# Patient Record
Sex: Female | Born: 1935 | Race: Black or African American | Hispanic: No
Health system: Southern US, Community
[De-identification: ages and names within clinical notes are randomized; demographics above are authoritative.]

## PROBLEM LIST (undated history)

## (undated) DIAGNOSIS — N182 Chronic kidney disease, stage 2 (mild): Secondary | ICD-10-CM

## (undated) DIAGNOSIS — F0390 Unspecified dementia without behavioral disturbance: Secondary | ICD-10-CM

## (undated) DIAGNOSIS — F32A Depression, unspecified: Secondary | ICD-10-CM

## (undated) DIAGNOSIS — I10 Essential (primary) hypertension: Secondary | ICD-10-CM

## (undated) DIAGNOSIS — Z789 Other specified health status: Secondary | ICD-10-CM

## (undated) DIAGNOSIS — F329 Major depressive disorder, single episode, unspecified: Secondary | ICD-10-CM

## (undated) DIAGNOSIS — E039 Hypothyroidism, unspecified: Secondary | ICD-10-CM

## (undated) DIAGNOSIS — N289 Disorder of kidney and ureter, unspecified: Secondary | ICD-10-CM

## (undated) HISTORY — PX: OTHER SURGICAL HISTORY: SHX169

## (undated) HISTORY — PX: FOOT SURGERY: SHX648

## (undated) HISTORY — DX: Major depressive disorder, single episode, unspecified: F32.9

## (undated) HISTORY — DX: Chronic kidney disease, stage 2 (mild): N18.2

## (undated) HISTORY — PX: CARDIAC SURGERY: SHX584

## (undated) HISTORY — DX: Depression, unspecified: F32.A

## (undated) HISTORY — DX: Hypothyroidism, unspecified: E03.9

## (undated) HISTORY — DX: Unspecified dementia, unspecified severity, without behavioral disturbance, psychotic disturbance, mood disturbance, and anxiety: F03.90

---

## 2013-11-21 ENCOUNTER — Emergency Department (HOSPITAL_BASED_OUTPATIENT_CLINIC_OR_DEPARTMENT_OTHER): Payer: PRIVATE HEALTH INSURANCE

## 2013-11-21 ENCOUNTER — Inpatient Hospital Stay (HOSPITAL_BASED_OUTPATIENT_CLINIC_OR_DEPARTMENT_OTHER)
Admission: EM | Admit: 2013-11-21 | Discharge: 2013-11-29 | DRG: 592 | Disposition: A | Discharge status: Skilled Nursing Facility | Payer: PRIVATE HEALTH INSURANCE | Attending: Internal Medicine | Admitting: Internal Medicine

## 2013-11-21 ENCOUNTER — Encounter (HOSPITAL_BASED_OUTPATIENT_CLINIC_OR_DEPARTMENT_OTHER): Payer: Self-pay | Admitting: Emergency Medicine

## 2013-11-21 DIAGNOSIS — L8993 Pressure ulcer of unspecified site, stage 3: Secondary | ICD-10-CM | POA: Diagnosis present

## 2013-11-21 DIAGNOSIS — N183 Chronic kidney disease, stage 3 unspecified: Secondary | ICD-10-CM

## 2013-11-21 DIAGNOSIS — E872 Acidosis, unspecified: Secondary | ICD-10-CM | POA: Diagnosis present

## 2013-11-21 DIAGNOSIS — T148XXA Other injury of unspecified body region, initial encounter: Secondary | ICD-10-CM

## 2013-11-21 DIAGNOSIS — E875 Hyperkalemia: Secondary | ICD-10-CM | POA: Diagnosis present

## 2013-11-21 DIAGNOSIS — Z515 Encounter for palliative care: Secondary | ICD-10-CM

## 2013-11-21 DIAGNOSIS — M7150 Other bursitis, not elsewhere classified, unspecified site: Secondary | ICD-10-CM | POA: Diagnosis present

## 2013-11-21 DIAGNOSIS — L089 Local infection of the skin and subcutaneous tissue, unspecified: Secondary | ICD-10-CM

## 2013-11-21 DIAGNOSIS — R32 Unspecified urinary incontinence: Secondary | ICD-10-CM | POA: Diagnosis present

## 2013-11-21 DIAGNOSIS — L8995 Pressure ulcer of unspecified site, unstageable: Secondary | ICD-10-CM | POA: Diagnosis present

## 2013-11-21 DIAGNOSIS — L89109 Pressure ulcer of unspecified part of back, unspecified stage: Principal | ICD-10-CM | POA: Diagnosis present

## 2013-11-21 DIAGNOSIS — Z66 Do not resuscitate: Secondary | ICD-10-CM | POA: Diagnosis not present

## 2013-11-21 DIAGNOSIS — F418 Other specified anxiety disorders: Secondary | ICD-10-CM

## 2013-11-21 DIAGNOSIS — M25552 Pain in left hip: Secondary | ICD-10-CM

## 2013-11-21 DIAGNOSIS — F329 Major depressive disorder, single episode, unspecified: Secondary | ICD-10-CM | POA: Diagnosis present

## 2013-11-21 DIAGNOSIS — I129 Hypertensive chronic kidney disease with stage 1 through stage 4 chronic kidney disease, or unspecified chronic kidney disease: Secondary | ICD-10-CM | POA: Diagnosis present

## 2013-11-21 DIAGNOSIS — N189 Chronic kidney disease, unspecified: Secondary | ICD-10-CM | POA: Diagnosis present

## 2013-11-21 DIAGNOSIS — L89209 Pressure ulcer of unspecified hip, unspecified stage: Secondary | ICD-10-CM | POA: Diagnosis present

## 2013-11-21 DIAGNOSIS — I10 Essential (primary) hypertension: Secondary | ICD-10-CM

## 2013-11-21 DIAGNOSIS — N289 Disorder of kidney and ureter, unspecified: Secondary | ICD-10-CM

## 2013-11-21 DIAGNOSIS — R262 Difficulty in walking, not elsewhere classified: Secondary | ICD-10-CM

## 2013-11-21 DIAGNOSIS — M719 Bursopathy, unspecified: Secondary | ICD-10-CM

## 2013-11-21 DIAGNOSIS — L89153 Pressure ulcer of sacral region, stage 3: Secondary | ICD-10-CM

## 2013-11-21 DIAGNOSIS — L8990 Pressure ulcer of unspecified site, unspecified stage: Secondary | ICD-10-CM

## 2013-11-21 DIAGNOSIS — F3289 Other specified depressive episodes: Secondary | ICD-10-CM | POA: Diagnosis present

## 2013-11-21 DIAGNOSIS — L89309 Pressure ulcer of unspecified buttock, unspecified stage: Secondary | ICD-10-CM | POA: Diagnosis present

## 2013-11-21 DIAGNOSIS — E860 Dehydration: Secondary | ICD-10-CM

## 2013-11-21 DIAGNOSIS — R627 Adult failure to thrive: Secondary | ICD-10-CM | POA: Diagnosis present

## 2013-11-21 HISTORY — DX: Disorder of kidney and ureter, unspecified: N28.9

## 2013-11-21 HISTORY — DX: Essential (primary) hypertension: I10

## 2013-11-21 HISTORY — DX: Other specified health status: Z78.9

## 2013-11-21 LAB — CBC WITH DIFFERENTIAL/PLATELET
BASOS ABS: 0 10*3/uL (ref 0.0–0.1)
Basophils Relative: 0 % (ref 0–1)
EOS PCT: 0 % (ref 0–5)
Eosinophils Absolute: 0 10*3/uL (ref 0.0–0.7)
HCT: 38.6 % (ref 36.0–46.0)
Hemoglobin: 12.9 g/dL (ref 12.0–15.0)
Lymphocytes Relative: 11 % — ABNORMAL LOW (ref 12–46)
Lymphs Abs: 1.9 10*3/uL (ref 0.7–4.0)
MCH: 31.4 pg (ref 26.0–34.0)
MCHC: 33.4 g/dL (ref 30.0–36.0)
MCV: 93.9 fL (ref 78.0–100.0)
Monocytes Absolute: 1 10*3/uL (ref 0.1–1.0)
Monocytes Relative: 6 % (ref 3–12)
NEUTROS PCT: 83 % — AB (ref 43–77)
Neutro Abs: 14 10*3/uL — ABNORMAL HIGH (ref 1.7–7.7)
PLATELETS: 421 10*3/uL — AB (ref 150–400)
RBC: 4.11 MIL/uL (ref 3.87–5.11)
RDW: 13.4 % (ref 11.5–15.5)
WBC: 16.9 10*3/uL — ABNORMAL HIGH (ref 4.0–10.5)

## 2013-11-21 LAB — COMPREHENSIVE METABOLIC PANEL
ALK PHOS: 89 U/L (ref 39–117)
ALT: 7 U/L (ref 0–35)
AST: 22 U/L (ref 0–37)
Albumin: 2.3 g/dL — ABNORMAL LOW (ref 3.5–5.2)
BUN: 44 mg/dL — ABNORMAL HIGH (ref 6–23)
CO2: 21 meq/L (ref 19–32)
Calcium: 10.3 mg/dL (ref 8.4–10.5)
Chloride: 101 mEq/L (ref 96–112)
Creatinine, Ser: 1.5 mg/dL — ABNORMAL HIGH (ref 0.50–1.10)
GFR, EST AFRICAN AMERICAN: 34 mL/min — AB (ref >90)
GFR, EST NON AFRICAN AMERICAN: 29 mL/min — AB (ref >90)
GLUCOSE: 127 mg/dL — AB (ref 70–99)
POTASSIUM: 5.8 meq/L — AB (ref 3.7–5.3)
Sodium: 139 mEq/L (ref 137–147)
TOTAL PROTEIN: 7.6 g/dL (ref 6.0–8.3)
Total Bilirubin: 0.4 mg/dL (ref 0.3–1.2)

## 2013-11-21 LAB — CK: Total CK: 60 U/L (ref 7–177)

## 2013-11-21 LAB — TROPONIN I: Troponin I: <0.3 ng/mL (ref <0.30)

## 2013-11-21 MED ORDER — MORPHINE SULFATE 4 MG/ML IJ SOLN
4.0000 mg | Freq: Once | INTRAMUSCULAR | Status: AC
  Administered 2013-11-21: 4 mg via INTRAMUSCULAR
  Filled 2013-11-21: qty 1

## 2013-11-21 MED ORDER — SODIUM CHLORIDE 0.9 % IV BOLUS (SEPSIS): 1000.0000 mL | Freq: Once | INTRAVENOUS | Status: DC

## 2013-11-21 NOTE — ED Provider Notes (Signed)
CSN: [id number]     Arrival date & time 11/21/13  1906 History  This chart was scribed for Geoffery Lyonsouglas Musab Wingard, MD by Leone PayorSonum Patel, ED Scribe. This patient was seen in room MH11/MH11 and the patient's care was started 7:33 PM.    Chief Complaint  Patient presents with  . Skin Ulcer    The history is provided by the patient. The history is limited by the condition of the patient. No language interpreter was used.    HPI Comments: Lindsay Browning is a 80 y.o. female brought in by ambulance, who presents to the Emergency Department complaining of skin ulcers to the buttocks. Pt states she has been lying on her couch for the past 7 weeks. She also complains of left hip pain. She reports living alone.   Past Medical History  Diagnosis Date  . Medical history unknown    Past Surgical History  Procedure Laterality Date  . Surgical history unknown     No family history on file. History  Substance Use Topics  . Smoking status: Unknown If Ever Smoked  . Smokeless tobacco: Not on file  . Alcohol Use: No   OB History   Grav Para Term Preterm Abortions TAB SAB Ect Mult Living                 Review of Systems  Unable to perform ROS  A complete 10 system review of systems was obtained and all systems are negative except as noted in the HPI and PMH.   Allergies  Review of patient's allergies indicates not on file.  Home Medications   Current Outpatient Rx  Name  Route  Sig  Dispense  Refill  . UNKNOWN TO PATIENT                BP 119/78  Pulse 98  Temp(Src) 97.8 F (36.6 C) (Oral)  Resp 20  SpO2 100% Physical Exam  Nursing note and vitals reviewed. Constitutional: She is oriented to person, place, and time.  Pt is an elderly female who appears unkempt. She is awake and alert and somewhat agitated.    HENT:  Head: Normocephalic and atraumatic.  Neck: Normal range of motion. Neck supple.  Cardiovascular: Normal rate.   Pulmonary/Chest: Effort normal.  Abdominal: She exhibits  no distension.  Neurological: She is alert and oriented to person, place, and time.  Skin: Skin is warm and dry.  Large sacral decubitus ulcer present that extends into the subcutaneous tissues. No muscle or bone visible.   Psychiatric: She has a normal mood and affect.    ED Course  Procedures (including critical care time)  DIAGNOSTIC STUDIES: Oxygen Saturation is 100% on RA, normal by my interpretation.    COORDINATION OF CARE: 7:51 PM Discussed treatment plan with pt at bedside and pt agreed to plan.   Labs Review Labs Reviewed - No data to display Imaging Review No results found.  EKG Interpretation    Date/Time:  Thursday November 21 2013 20:11:33 EST Ventricular Rate:  101 PR Interval:  172 QRS Duration: 88 QT Interval:  364 QTC Calculation: 471 R Axis:   -51 Text Interpretation:  Sinus tachycardia Left axis deviation Possible Anterior infarct , age undetermined Abnormal ECG Confirmed by DELOS  MD, Meria Crilly ([id number]) on 11/21/2013 9:21:08 PM            MDM  No diagnosis found. Patient is a 80 year old female with history of hypertension and chronic renal insufficiency. She was recently admitted at  [facility] for right hip pain. She had an MRI performed at that time which revealed a torn gluteus muscle. From reviewing the discharge summary it appears as though she was quite difficult with the hospital staff and she was ultimately discharged back to home. Her neighbor went to check on her today and found her on the sofa with it appearing as if she had been there for quite some time. She was apparently wallowing in her own feces and there were  bedbugs on the sofa and the patient. She was brought here extremely unkempt and disheveled. She was very uncooperative from the time she arrived here.  On exam she has a large sacral decubitus ulcer. She also appears dehydrated. I had planned to hydrate the patient, however multiple attempts at IV access by both nursing and  myself with and without the aid of ultrasound, were unsuccessful. I had attempted also to place an EJ catheter, however the patient informed me that she has had a neck injury in the past and that her head will not turn. An attempted arterial stick was made in the radial artery to obtain blood for the lab, however this was unsuccessful as well. I ultimately obtained blood for laboratory studies through a femoral puncture. Laboratory studies reveal a white count of 17,000 and BUN and creatinine are 44 and 1.5. IV access has been unable to be obtained and I do not feel as though sedating this uncooperative patient at [facility] for placement of a central line is in this patient's best interest.  She is clearly not in a situation where she can safely return home. I cannot say for sure that she has the decision-making capacity to refuse care, and she clearly does not have the physical ability to return home. She will require consult from social services, IV hydration, and treatment of this large decubitus ulcer which she has developed. As these are all things that I cannot do at this facility, I believe she requires admission at the main hospital. I have spoken with Dr. Onalee Hua at [facility] regarding this possibility. She agrees to accept the patient for observation.   I personally performed the services described in this documentation, which was scribed in my presence. The recorded information has been reviewed and is accurate.      Geoffery Lyons, MD 11/22/13 0001

## 2013-11-21 NOTE — ED Notes (Signed)
No old EKG found  

## 2013-11-21 NOTE — ED Notes (Signed)
Pt. Daughter Timothy LassoCharise Horne called RN Earlene Plateravis at [facility].  Pt. Daughter reports she is from [place] and cant be here to care for the Pt.    Pt. Daughter reports to RN Earlene PlaterDavis that the Pt. Eats what people bring to her.  Pt. Daughter reports that the Pt. Speaks with her daily.   Pt. Daughter reports the Pt. Will not let anyone help her with anything.

## 2013-11-21 NOTE — ED Notes (Signed)
Pt. Fights when RN and others attempt IV and blood draws.

## 2013-11-21 NOTE — ED Notes (Signed)
Attempted Urine via in and out cath on  Pt. With no success.  Pt. Intolerant of cath and will not sit on bedpan due to sores on buttocks and sore legs.

## 2013-11-21 NOTE — ED Notes (Addendum)
tcf patient's daughter, Lanier Prudesharice, she requested that patient be transferred to [facility] for further care if possible updated on poc and progress as of 2130

## 2013-11-21 NOTE — ED Notes (Signed)
EMS called by daughter.  States pt has been unable to care for herself and found lying on the sofa soiled in her feces and urine.  Per EMS sofa was infested with bedbugs.  Pt also has a decubitus on buttocks.

## 2013-11-21 NOTE — ED Notes (Signed)
Pt.  Will not do what we ask due to she reports pain and does not want anyone touching her.

## 2013-11-21 NOTE — ED Notes (Signed)
Vitals given to [employer]

## 2013-11-22 DIAGNOSIS — F329 Major depressive disorder, single episode, unspecified: Secondary | ICD-10-CM

## 2013-11-22 DIAGNOSIS — N183 Chronic kidney disease, stage 3 unspecified: Secondary | ICD-10-CM

## 2013-11-22 DIAGNOSIS — R262 Difficulty in walking, not elsewhere classified: Secondary | ICD-10-CM

## 2013-11-22 DIAGNOSIS — N289 Disorder of kidney and ureter, unspecified: Secondary | ICD-10-CM | POA: Diagnosis present

## 2013-11-22 DIAGNOSIS — L8993 Pressure ulcer of unspecified site, stage 3: Secondary | ICD-10-CM

## 2013-11-22 DIAGNOSIS — L89109 Pressure ulcer of unspecified part of back, unspecified stage: Principal | ICD-10-CM

## 2013-11-22 DIAGNOSIS — M25559 Pain in unspecified hip: Secondary | ICD-10-CM

## 2013-11-22 DIAGNOSIS — L8990 Pressure ulcer of unspecified site, unspecified stage: Secondary | ICD-10-CM | POA: Insufficient documentation

## 2013-11-22 DIAGNOSIS — L089 Local infection of the skin and subcutaneous tissue, unspecified: Secondary | ICD-10-CM

## 2013-11-22 DIAGNOSIS — E860 Dehydration: Secondary | ICD-10-CM

## 2013-11-22 DIAGNOSIS — T148XXA Other injury of unspecified body region, initial encounter: Secondary | ICD-10-CM

## 2013-11-22 DIAGNOSIS — F3289 Other specified depressive episodes: Secondary | ICD-10-CM

## 2013-11-22 DIAGNOSIS — I10 Essential (primary) hypertension: Secondary | ICD-10-CM | POA: Diagnosis present

## 2013-11-22 LAB — BASIC METABOLIC PANEL
BUN: 46 mg/dL — AB (ref 6–23)
CHLORIDE: 103 meq/L (ref 96–112)
CO2: 17 mEq/L — ABNORMAL LOW (ref 19–32)
Calcium: 9.9 mg/dL (ref 8.4–10.5)
Creatinine, Ser: 1.68 mg/dL — ABNORMAL HIGH (ref 0.50–1.10)
GFR calc Af Amer: 29 mL/min — ABNORMAL LOW (ref >90)
GFR calc non Af Amer: 25 mL/min — ABNORMAL LOW (ref >90)
Glucose, Bld: 114 mg/dL — ABNORMAL HIGH (ref 70–99)
Potassium: 5.4 mEq/L — ABNORMAL HIGH (ref 3.7–5.3)
Sodium: 138 mEq/L (ref 137–147)

## 2013-11-22 MED ORDER — ENSURE COMPLETE PO LIQD
237.0000 mL | Freq: Two times a day (BID) | ORAL | Status: DC
  Administered 2013-11-22 – 2013-11-25 (×3): 237 mL via ORAL

## 2013-11-22 MED ORDER — SODIUM CHLORIDE 0.9 % IV SOLN
INTRAVENOUS | Status: AC
  Administered 2013-11-22: 03:00:00 via INTRAVENOUS

## 2013-11-22 MED ORDER — ZINC SULFATE 220 (50 ZN) MG PO CAPS
220.0000 mg | ORAL_CAPSULE | Freq: Every day | ORAL | Status: DC
  Administered 2013-11-23 – 2013-11-28 (×4): 220 mg via ORAL
  Filled 2013-11-22 (×8): qty 1

## 2013-11-22 MED ORDER — VITAMIN C 500 MG PO TABS
500.0000 mg | ORAL_TABLET | Freq: Two times a day (BID) | ORAL | Status: DC
  Administered 2013-11-23 – 2013-11-28 (×5): 500 mg via ORAL
  Filled 2013-11-22 (×16): qty 1

## 2013-11-22 MED ORDER — MORPHINE SULFATE 2 MG/ML IJ SOLN
2.0000 mg | INTRAMUSCULAR | Status: DC | PRN
  Administered 2013-11-22: 2 mg via INTRAVENOUS
  Filled 2013-11-22: qty 1

## 2013-11-22 MED ORDER — VANCOMYCIN HCL 10 G IV SOLR
1250.0000 mg | INTRAVENOUS | Status: AC
  Administered 2013-11-22: 1250 mg via INTRAVENOUS
  Filled 2013-11-22: qty 1250

## 2013-11-22 MED ORDER — DEXTROSE 5 % IV SOLN
1.0000 g | INTRAVENOUS | Status: DC
  Administered 2013-11-22 – 2013-11-24 (×3): 1 g via INTRAVENOUS
  Filled 2013-11-22 (×4): qty 1

## 2013-11-22 MED ORDER — NON FORMULARY: 500.0000 mL | Freq: Two times a day (BID) | Status: DC

## 2013-11-22 MED ORDER — VANCOMYCIN HCL IN DEXTROSE 750-5 MG/150ML-% IV SOLN
750.0000 mg | INTRAVENOUS | Status: DC
  Administered 2013-11-23 – 2013-11-24 (×2): 750 mg via INTRAVENOUS
  Filled 2013-11-22 (×4): qty 150

## 2013-11-22 MED ORDER — DAKINS (1/4 STRENGTH) 0.125 % EX SOLN
Freq: Two times a day (BID) | CUTANEOUS | Status: DC
  Administered 2013-11-26 – 2013-11-28 (×4)
  Filled 2013-11-22 (×2): qty 473

## 2013-11-22 MED ORDER — ADULT MULTIVITAMIN W/MINERALS CH
1.0000 | ORAL_TABLET | Freq: Every day | ORAL | Status: DC
  Administered 2013-11-23 – 2013-11-28 (×4): 1 via ORAL
  Filled 2013-11-22 (×8): qty 1

## 2013-11-22 MED ORDER — NEBIVOLOL HCL 10 MG PO TABS
10.0000 mg | ORAL_TABLET | Freq: Every day | ORAL | Status: DC
  Administered 2013-11-22 – 2013-11-28 (×5): 10 mg via ORAL
  Filled 2013-11-22 (×9): qty 1

## 2013-11-22 MED ORDER — HEPARIN SODIUM (PORCINE) 5000 UNIT/ML IJ SOLN
5000.0000 [IU] | Freq: Three times a day (TID) | INTRAMUSCULAR | Status: DC
  Filled 2013-11-22 (×24): qty 1

## 2013-11-22 MED ORDER — OXYCODONE-ACETAMINOPHEN 5-325 MG PO TABS
1.0000 | ORAL_TABLET | Freq: Four times a day (QID) | ORAL | Status: DC | PRN
  Administered 2013-11-22 – 2013-11-25 (×6): 1 via ORAL
  Administered 2013-11-25: 2 via ORAL
  Administered 2013-11-27 – 2013-11-29 (×3): 1 via ORAL
  Filled 2013-11-22: qty 2
  Filled 2013-11-22: qty 1
  Filled 2013-11-22: qty 2
  Filled 2013-11-22 (×4): qty 1
  Filled 2013-11-22: qty 2
  Filled 2013-11-22 (×5): qty 1

## 2013-11-22 NOTE — Consult Note (Addendum)
WOC wound consult note Reason for Consult: evaluation of sacral pressure ulcer. Pt combative, unable to rationalize with patient the cause and treatment for pressure ulcers.  She is lying on wads of linen and underpads from her transport to the floor and refuses to allow staff to turn her, to assess her wounds and remove these linens.  Apparently based on the records she has been in her home/unkept, unclear on who has been caring for her if at all. She reports "nerve pain" in her left hip that caused her to not be able to walk and she has been lying somewhere in the home for 7 weeks. She also reports "bedsores" on her hip that she has been caring for. She is actively yelling at 5 staff members including me at the bedside as we are trying to gently encourage her to turn over on her own. She can not be convinced that pressure ulcers can be a source of sepsis and can result in death.  She is very angry with staff.  I proceeded to turn her with the assistance of the linens under her at which time she is screaming and swinging her left arm to hit me.  She is swinging at the other staff during this time as well.  I was able to visualize the wound for a very short period of time (see below) Wound type: Unstageable Pressure ulcer with 100% yellow slough/and some black soft eschar covering her wound Pressure Ulcer POA: Yes Measurement: at least 20cm x 30cm (probally bigger but she started to hit this WOC nurse, therefore I was not able to get accurate measurements) Wound bed: yellow/black/wet, necrotic tissue. No visualized muscle/bone Drainage (amount, consistency, odor) unclear since she is lying in urine and feces, but the buttocks area macerated and there is full thickness skin loss over the entire lower buttocks associated with moisture and incontinence. Dressing procedure/placement/frequency: Discussed case with Dr. Jerral RalphGhimire, ideally she would benefit from PT for hydrotherapy, placement of air mattress overlay  and use of chemical debridement since area is so foul and large at current state.  However I do not think this patient will allow any of theses treatments.  I will start with ordering chemical debridement with Dakin's solution for the wounds on her buttocks/sacrum to see if she will even allow the staff to apply this.  Will add air mattress but again pt may refuse.    Psychiatric evaluation pending for decision making abilities, it would be helpful if the MD will look at her wounds to see the extent to which this patient has allowed herself go. These wounds are very apparently related to incontinence, pressure, sheer and friction.  Very well may be a source of sepsis now or very soon. Will need maximum nutrition and aggressive wound care for wounds to heal if that is the patients and families wishes. Otherwise would consider Palliative consult for GOC and possible hospice care.  Discussed POC with patient and bedside nurse.  Re consult if needed, will not follow at this time. Thanks  Jalee Saine [employer] RN, CWOCN [phone])

## 2013-11-22 NOTE — Progress Notes (Signed)
Pt refused multiple times to let us remove extra blankets underneath her. Attempted one last time at 6:40 am. Still no progress on this issue.

## 2013-11-22 NOTE — Progress Notes (Signed)
PATIENT DETAILS Name: Elwyn Ladesabell C Addair Age: 80 y.o. Sex: female Date of Birth: Mar 10, 1919 Admit Date: 11/21/2013 Admitting Physician Haydee Monicaachal A David, MD UJW:[id number]:TRIPP, Sherilyn CooterHENRY, MD  Subjective: Very uncooperative. Refuses examination.  Assessment/Plan: Principal Problem:   Infected sacral decubitus-Unstageable - Difficult situation, Uncooperative will not let us examine properly to assess how bad the wound is. Per wound RN, who briefly examined, she has a unstageable sacral decubitus with a lot of necrotic area. Will start empiric vancomycin and Zosyn given leukocytosis. - Further workup will depend on patient's cooperation, will need psychiatry to assess this patient's capacity to make medical decisions. She clearly does not seem to understand all the medical complex issues, and only wants to ensure that she is discharged back home.  Bursitis left hip - Consulted Dr. Magnus IvanBlackman orthopedics to evaluate if patient is a candidate for  intra-articular steroid injection. - Apparently from the history obtained, patient was discharged recently from [facility], has been complaining of persistent left hip pain and as a result she has been nonambulatory. She now presents to our hospital with infected decubitus ulcer, apparently her neighbors found her lying covered in urine and feces.  Suspected chronic kidney disease stage III - Unknown baseline. - Have asked RN to get medical records from [facility], however patient refused to sign a release of medical records form. - For now monitor electrolytes periodically, check a UA if patient will give us a sample  Hyperkalemia - Will treat with Kayexalate, recheck electrolytes in a.m.  Hypertension - Continue with Bystolic  Social issues - Apparently discharged from [facility] a few weeks back-lives alone-has 2 daughters-[name] [place]Horne in  [place] ([place] [phone]) and Marlynn [place]kingSharie who [place] in  [place] ([place] [phone]). Patient was found by her neighbors, lying in feces and urine. Apparently non-ambulatory due to severe left hip pain. - Very difficult situation, she does not understand all of noted medical issues, only insisting on being discharged HOME- refusing/uncooperative to exam, questions and further evaluation. - Have asked psychiatry to evaluate to see if she has capacity.  Disposition: Remain inpatient  DVT Prophylaxis: Prophylactic Heparin  Code Status: Full code   Family Communication Spoke with Loleta BooksSharice Horne over the phone, she lives in  [place] ([place] [phone]).   Procedures:  None  CONSULTS:  psychiatry and orthopedic surgery  Time spent 40 minutes-which includes 50% of the time with face-to-face with patient/ family and coordinating care related to the above assessment and plan.    MEDICATIONS: Scheduled Meds: . feeding supplement (ENSURE COMPLETE)  237 mL Oral BID BM  . multivitamin with minerals  1 tablet Oral Daily  . nebivolol  10 mg Oral Daily  . NON FORMULARY 500 mL  500 mL Irrigation BID   Continuous Infusions: . sodium chloride 75 mL/hr at 11/22/13 0246   PRN Meds:.morphine injection, oxyCODONE-acetaminophen  Antibiotics: Anti-infectives   None       PHYSICAL EXAM: Vital signs in last 24 hours: Filed Vitals:   11/22/13 0023 11/22/13 0145 11/22/13 0650 11/22/13 0900  BP: 116/53 107/62 125/50   Pulse: 70 85 65   Temp:  98 F (36.7 C) 97.6 F (36.4 C)   TempSrc:  Oral    Resp:  16 18   Height:    5\' 6"  (1.676 m)  Weight:    87.544 kg (193 lb)  SpO2:  99% 100%     Weight change:  Filed Weights   11/22/13 0900  Weight: 87.544 kg (  193 lb)   Body mass index is 31.17 kg/(m^2).   Gen Exam: Awake and mostly alert with clear speech.  Very uncooperative- therefore limited exam Neck: Supple, No JVD.   Chest: B/L Clear.   CVS: S1 S2 Regular, no murmurs.  Abdomen: soft, BS +, non tender, non distended.  Extremities:  no edema, lower extremities warm to touch. Neurologic: Non Focal.  Seems to move all 4 extremities   Intake/Output from previous day:  Intake/Output Summary (Last 24 hours) at 11/22/13 1158 Last data filed at 11/22/13 0513  Gross per 24 hour  Intake 383.75 ml  Output      0 ml  Net 383.75 ml     LAB RESULTS: CBC  Recent Labs Lab 11/21/13 2153  WBC 16.9*  HGB 12.9  HCT 38.6  PLT 421*  MCV 93.9  MCH 31.4  MCHC 33.4  RDW 13.4  LYMPHSABS 1.9  MONOABS 1.0  EOSABS 0.0  BASOSABS 0.0    Chemistries   Recent Labs Lab 11/21/13 2153 11/22/13 0432  NA 139 138  K 5.8* 5.4*  CL 101 103  CO2 21 17*  GLUCOSE 127* 114*  BUN 44* 46*  CREATININE 1.50* 1.68*  CALCIUM 10.3 9.9    CBG: No results found for this basename: GLUCAP,  in the last 168 hours  GFR Estimated Creatinine Clearance: 23.8 ml/min (by C-G formula based on Cr of 1.68).  Coagulation profile No results found for this basename: INR, PROTIME,  in the last 168 hours  Cardiac Enzymes  Recent Labs Lab 11/21/13 2153  TROPONINI <0.30    No components found with this basename: POCBNP,  No results found for this basename: DDIMER,  in the last 72 hours No results found for this basename: HGBA1C,  in the last 72 hours No results found for this basename: CHOL, HDL, LDLCALC, TRIG, CHOLHDL, LDLDIRECT,  in the last 72 hours No results found for this basename: TSH, T4TOTAL, FREET3, T3FREE, THYROIDAB,  in the last 72 hours No results found for this basename: VITAMINB12, FOLATE, FERRITIN, TIBC, IRON, RETICCTPCT,  in the last 72 hours No results found for this basename: LIPASE, AMYLASE,  in the last 72 hours  Urine Studies No results found for this basename: UACOL, UAPR, USPG, UPH, UTP, UGL, UKET, UBIL, UHGB, UNIT, UROB, ULEU, UEPI, UWBC, URBC, UBAC, CAST, CRYS, UCOM, BILUA,  in the last 72 hours  MICROBIOLOGY: No results found for this or any previous visit (from the past 240 hour(s)).  RADIOLOGY  STUDIES/RESULTS: Dg Chest 2 View  11/22/2013   CLINICAL DATA:  Skin ulcer.  EXAM: CHEST  2 VIEW  COMPARISON:  None currently available  FINDINGS: No cardiomegaly. No acute infiltrate or edema. No effusion or pneumothorax. No acute osseous findings.  IMPRESSION: No active cardiopulmonary disease.   Electronically Signed   By: Tiburcio Pea M.D.   On: 11/22/2013 00:03   Dg Hip Complete Left  11/22/2013   CLINICAL DATA:  Skin ulcer  EXAM: LEFT HIP - COMPLETE 2+ VIEW  COMPARISON:  09/27/2013  FINDINGS: No evidence of acute fracture or dislocation involving the hips. The pelvic ring is intact. There is advanced lower lumbar facet degeneration. Calcified uterine fibroid noted. Punctate high-density material is chronically associated with the left gluteus musculature near the greater trochanter.  IMPRESSION: No acute osseous findings.   Electronically Signed   By: Tiburcio Pea M.D.   On: 11/22/2013 00:40    Jeoffrey Massed, MD  [facility] Pager:[phone]  If 7PM-7AM, please  contact night-coverage [email or web address] Password [email or web address] 11/22/2013, 11:58 AM   LOS: 1 day

## 2013-11-22 NOTE — Progress Notes (Signed)
Spoke with patient's daughter Lanier PrudeSharice via phone. States she is willing to talk to social work about possibilities for care for her mother. Cell number [phone]. Daughter lives in [place].

## 2013-11-22 NOTE — Progress Notes (Addendum)
ANTIBIOTIC CONSULT NOTE - INITIAL  Pharmacy Consult for vancomycin and ceftazidime Indication: wound infection  Allergies  Allergen Reactions  . Penicillins     Patient Measurements: Height: 5\' 6"  (167.6 cm) Weight: 193 lb (87.544 kg) IBW/kg (Calculated) : 59.3  Vital Signs: Temp: 97.6 F (36.4 C) (01/16 0650) Temp src: Oral (01/16 0145) BP: 125/50 mmHg (01/16 0650) Pulse Rate: 65 (01/16 0650) Intake/Output from previous day: 01/15 0701 - 01/16 0700 In: 383.8 [P.O.:200; I.V.:183.8] Out: -  Intake/Output from this shift:    Labs:  Recent Labs  11/21/13 2153 11/22/13 0432  WBC 16.9*  --   HGB 12.9  --   PLT 421*  --   CREATININE 1.50* 1.68*   Estimated Creatinine Clearance: 23.8 ml/min (by C-G formula based on Cr of 1.68). No results found for this basename: VANCOTROUGH, VANCOPEAK, VANCORANDOM, GENTTROUGH, GENTPEAK, GENTRANDOM, TOBRATROUGH, TOBRAPEAK, TOBRARND, AMIKACINPEAK, AMIKACINTROU, AMIKACIN,  in the last 72 hours   Microbiology: No results found for this or any previous visit (from the past 720 hour(s)).  Medical History: Past Medical History  Diagnosis Date  . Medical history unknown   . Hypertension   . Renal disorder     Medications:  Prescriptions prior to admission  Medication Sig Dispense Refill  . acyclovir (ZOVIRAX) 200 MG capsule Take 200 mg by mouth 5 (five) times daily.      . nebivolol (BYSTOLIC) 10 MG tablet Take 10 mg by mouth daily.      . potassium chloride (K-DUR) 10 MEQ tablet Take 10 mEq by mouth daily.      . solifenacin (VESICARE) 10 MG tablet Take by mouth daily.       Assessment: 80 y/o female who developed a sacral decub ulcer from laying on her couch for 3 weeks and was found covered in urine and feces. Pharmacy consulted to begin vancomycin and ceftazidime for empiric coverage. SCr is elevated at 1.68. WBC are elevated but patient is afebrile. Noted patient has a PCN allergy with unknown reaction. Spoke with patient to try  and determine reaction and she tells me she's never taken any PCN.   Goal of Therapy:  Vancomycin trough level 10-15 mcg/ml  Plan:  -Vancomycin 1250 mg IV once then 750 mg IV q24h -Ceftazidime 1 g IV q24h -Monitor renal function and clinical course  [facility] LLCJennifer Richfield, [address] BoulevardPharm.D., BCPS Clinical Pharmacist Pager: [phone] 11/22/2013 11:59 AM

## 2013-11-22 NOTE — Progress Notes (Signed)
Patient ID: Lindsay Browning, female   DOB: 1919-08-16, 80 y.o.   MRN: [id number] I really can not get a good exam on her left hip at all.  She really will not let me move it or touch it to get an idea with what may be going on.  Thus, I can't even provide an injection.  I have no recommendations at all other than supportive care.  Maybe a new MRI could show if there is any new left hip pathology, but the [facility] from back in November was negative for a fracture.  The primary team can attempt a new MRI if warranted.  Please call back if needed.

## 2013-11-22 NOTE — H&P (Signed)
PCP:   Florentina Jenny, MD   Chief Complaint:  Hip pain  HPI: 80 yo female lives alone h/o htn only was found by her neighbor laying on her cough covered in urine and feces.  ems called, took pt to [facility] center.  Pt was hospitalized at [facility] 2-3 weeks ago for left hip pain.  Multiple imaging studies done revealing left gluteal tendon abnormality and bursitis.  Pt refused snf or rehab so was sent home.  No fracture found.  Pt reports she has been laying on her couch for 3 weeks.  Pt very uncooperative with answering questions.  She has a large amt of distrust of medical professionals.  She says her hip has been so painful she can no longer ambulate.  She has been eating bread only per her report.  She has since developed a sacral decub ulcer which is painful.  She has ordered some herbal medicine online which she thinks will fix her hip problem.  Pt is angry that she has been forced to come here.  She thinks we are trying to take away her independence by putting her in an "old folks home".  i was called by dr Judd Lien to have the pt placed in facility where she can get some help.  Review of Systems:  Positive and negative as per HPI otherwise all other systems are negative  Past Medical History: Past Medical History  Diagnosis Date  . Medical history unknown   . Hypertension   . Renal disorder    Past Surgical History  Procedure Laterality Date  . Surgical history unknown    . Cardiac surgery      cath  . Foot surgery      Medications: Prior to Admission medications   Medication Sig Start Date End Date Taking? Authorizing Provider  acyclovir (ZOVIRAX) 200 MG capsule Take 200 mg by mouth 5 (five) times daily.   Yes Historical Provider, MD  nebivolol (BYSTOLIC) 10 MG tablet Take 10 mg by mouth daily.   Yes Historical Provider, MD  potassium chloride (K-DUR) 10 MEQ tablet Take 10 mEq by mouth daily.   Yes Historical Provider, MD  solifenacin (VESICARE) 10 MG tablet Take by mouth  daily.   Yes Historical Provider, MD  UNKNOWN TO PATIENT    Yes Historical Provider, MD    Allergies:   Allergies  Allergen Reactions  . Penicillins     Social History: Lives at home alone  Family History: none  Physical Exam: Filed Vitals:   11/21/13 1917 11/21/13 2323 11/22/13 0023 11/22/13 0145  BP: 119/78 115/52 116/53 107/62  Pulse: 98 92 70 85  Temp: 97.8 F (36.6 C) 97.8 F (36.6 C)  98 F (36.7 C)  TempSrc: Oral   Oral  Resp: 20 19  16   SpO2: 100% 100%  99%   General appearance: alert, cooperative and no distress Head: Normocephalic, without obvious abnormality, atraumatic Eyes: negative Nose: Nares normal. Septum midline. Mucosa normal. No drainage or sinus tenderness. Neck: no JVD and supple, symmetrical, trachea midline Lungs: clear to auscultation bilaterally Heart: regular rate and rhythm, S1, S2 normal, no murmur, click, rub or gallop Abdomen: soft, non-tender; bowel sounds normal; no masses,  no organomegaly Extremities: extremities normal, atraumatic, no cyanosis or edema pain with any movement to left hip Pulses: 2+ and symmetric Skin: Skin color, texture, turgor normal. No rashes or lesions stage 3 sacral decub ulcer, no surrounding erythema or purulent dc Neurologic: Grossly normal Psych:  Uncooperative but  competent   Labs on Admission:   Recent Labs  11/21/13 2153  NA 139  K 5.8*  CL 101  CO2 21  GLUCOSE 127*  BUN 44*  CREATININE 1.50*  CALCIUM 10.3    Recent Labs  11/21/13 2153  AST 22  ALT 7  ALKPHOS 89  BILITOT 0.4  PROT 7.6  ALBUMIN 2.3*     Recent Labs  11/21/13 2153  WBC 16.9*  NEUTROABS 14.0*  HGB 12.9  HCT 38.6  MCV 93.9  PLT 421*    Recent Labs  11/21/13 2153  CKTOTAL 60  TROPONINI <0.30   Radiological Exams on Admission: Dg Chest 2 View  11/22/2013   CLINICAL DATA:  Skin ulcer.  EXAM: CHEST  2 VIEW  COMPARISON:  None currently available  FINDINGS: No cardiomegaly. No acute infiltrate or edema. No  effusion or pneumothorax. No acute osseous findings.  IMPRESSION: No active cardiopulmonary disease.   Electronically Signed   By: Tiburcio PeaJonathan  Watts M.D.   On: 11/22/2013 00:03   Dg Hip Complete Left  11/22/2013   CLINICAL DATA:  Skin ulcer  EXAM: LEFT HIP - COMPLETE 2+ VIEW  COMPARISON:  09/27/2013  FINDINGS: No evidence of acute fracture or dislocation involving the hips. The pelvic ring is intact. There is advanced lower lumbar facet degeneration. Calcified uterine fibroid noted. Punctate high-density material is chronically associated with the left gluteus musculature near the greater trochanter.  IMPRESSION: No acute osseous findings.   Electronically Signed   By: Tiburcio PeaJonathan  Watts M.D.   On: 11/22/2013 00:40    Assessment/Plan  80 yo female with left hip pain, inability to walk with new pressure ulcer  Principal Problem:   Sacral decubitus ulcer, stage III- wound care in am.  PT eval.  Active Problems:   Hypertension   Renal disorder  Pt is vague about how she has been getting food and water, she has to be getting this on her own somehow.  Will need to call ortho in am to evaluate for any further w/u or treatment options ??steroid injection.  Place on percocet orally.  i think she needs rehab/and or snf, but she absolutely refuses at this time.  Readdress in the morning.  [facility] records reviewed.  Saide Lanuza A 11/22/2013, 2:34 AM

## 2013-11-22 NOTE — Progress Notes (Addendum)
Pt remains agitated and aggressive. Pt refusing all 2200 meds and any attempt to provide wound care at this time. Will continue to monitor.

## 2013-11-22 NOTE — Progress Notes (Signed)
UR completed. Patient changed to inpatient r/t requiring IVF @ 75cc/hr and IV antibiotics.

## 2013-11-22 NOTE — Progress Notes (Signed)
Patient refused to allow nurse and NT to change her soiled linens after multiple attempts. Patient also refused to sign medical release forms. MD paged and made aware of situation.

## 2013-11-22 NOTE — Consult Note (Signed)
Reason for Consult:capacity evaluation Referring Physician: Jonetta Osgood, MD   Lindsay Browning is an 80 y.o. female.  HPI: Patient is seen and chart reviewed, case discussed with Dr. Sloan Leiter and her daughter who lives in [place]. Patient has been suffering with extensive medical problem and has limited understanding and does not have capacity to understand in depth of the problems and complication of the recommended procedure etc. She has been reluctant or refusing medical care at this time.   MSE: Patient appeared lying on her bed and partially covered. She knows her name and where she is at and her problem is nerve pain but does not understand the extent of the medical problem or care required. She has been living all by herself and says not ready to give up freedom and she does believe in god etc. She has upset and agree affect but denied being angry mood. She has no evidence of psychosis.   Past Medical History  Diagnosis Date  . Medical history unknown   . Hypertension   . Renal disorder     Past Surgical History  Procedure Laterality Date  . Surgical history unknown    . Cardiac surgery      cath  . Foot surgery      No family history on file.  Social History:  reports that she does not drink alcohol or use illicit drugs. Her tobacco history is not on file.  Allergies:  Allergies  Allergen Reactions  . Penicillins Other (See Comments)    Unknown - pt states she's never taken PCN before    Medications: I have reviewed the patient's current medications.  Results for orders placed during the hospital encounter of 11/21/13 (from the past 48 hour(s))  CBC WITH DIFFERENTIAL     Status: Abnormal   Collection Time    11/21/13  9:53 PM      Result Value Range   WBC 16.9 (*) 4.0 - 10.5 K/uL   RBC 4.11  3.87 - 5.11 MIL/uL   Hemoglobin 12.9  12.0 - 15.0 g/dL   HCT 38.6  36.0 - 46.0 %   MCV 93.9  78.0 - 100.0 fL   MCH 31.4  26.0 - 34.0 pg   MCHC 33.4  30.0 - 36.0 g/dL   RDW 13.4  11.5 - 15.5 %   Platelets 421 (*) 150 - 400 K/uL   Neutrophils Relative % 83 (*) 43 - 77 %   Lymphocytes Relative 11 (*) 12 - 46 %   Monocytes Relative 6  3 - 12 %   Eosinophils Relative 0  0 - 5 %   Basophils Relative 0  0 - 1 %   Neutro Abs 14.0 (*) 1.7 - 7.7 K/uL   Lymphs Abs 1.9  0.7 - 4.0 K/uL   Monocytes Absolute 1.0  0.1 - 1.0 K/uL   Eosinophils Absolute 0.0  0.0 - 0.7 K/uL   Basophils Absolute 0.0  0.0 - 0.1 K/uL  COMPREHENSIVE METABOLIC PANEL     Status: Abnormal   Collection Time    11/21/13  9:53 PM      Result Value Range   Sodium 139  137 - 147 mEq/L   Potassium 5.8 (*) 3.7 - 5.3 mEq/L   Chloride 101  96 - 112 mEq/L   CO2 21  19 - 32 mEq/L   Glucose, Bld 127 (*) 70 - 99 mg/dL   BUN 44 (*) 6 - 23 mg/dL   Creatinine, Ser 1.50 (*)  0.50 - 1.10 mg/dL   Calcium 10.3  8.4 - 10.5 mg/dL   Total Protein 7.6  6.0 - 8.3 g/dL   Albumin 2.3 (*) 3.5 - 5.2 g/dL   AST 22  0 - 37 U/L   Comment: SLIGHT HEMOLYSIS     HEMOLYSIS AT THIS LEVEL MAY AFFECT RESULT   ALT 7  0 - 35 U/L   Alkaline Phosphatase 89  39 - 117 U/L   Total Bilirubin 0.4  0.3 - 1.2 mg/dL   GFR calc non Af Amer 29 (*) >90 mL/min   GFR calc Af Amer 34 (*) >90 mL/min   Comment: (NOTE)     The eGFR has been calculated using the CKD EPI equation.     This calculation has not been validated in all clinical situations.     eGFR's persistently <90 mL/min signify possible Chronic Kidney     Disease.  CK     Status: None   Collection Time    11/21/13  9:53 PM      Result Value Range   Total CK 60  7 - 177 U/L  TROPONIN I     Status: None   Collection Time    11/21/13  9:53 PM      Result Value Range   Troponin I <0.30  <0.30 ng/mL   Comment:            Due to the release kinetics of cTnI,     a negative result within the first hours     of the onset of symptoms does not rule out     myocardial infarction with certainty.     If myocardial infarction is still suspected,     repeat the test at  appropriate intervals.  BASIC METABOLIC PANEL     Status: Abnormal   Collection Time    11/22/13  4:32 AM      Result Value Range   Sodium 138  137 - 147 mEq/L   Potassium 5.4 (*) 3.7 - 5.3 mEq/L   Chloride 103  96 - 112 mEq/L   CO2 17 (*) 19 - 32 mEq/L   Glucose, Bld 114 (*) 70 - 99 mg/dL   BUN 46 (*) 6 - 23 mg/dL   Creatinine, Ser 1.68 (*) 0.50 - 1.10 mg/dL   Calcium 9.9  8.4 - 10.5 mg/dL   GFR calc non Af Amer 25 (*) >90 mL/min   GFR calc Af Amer 29 (*) >90 mL/min   Comment: (NOTE)     The eGFR has been calculated using the CKD EPI equation.     This calculation has not been validated in all clinical situations.     eGFR's persistently <90 mL/min signify possible Chronic Kidney     Disease.    Dg Chest 2 View  11/22/2013   CLINICAL DATA:  Skin ulcer.  EXAM: CHEST  2 VIEW  COMPARISON:  None currently available  FINDINGS: No cardiomegaly. No acute infiltrate or edema. No effusion or pneumothorax. No acute osseous findings.  IMPRESSION: No active cardiopulmonary disease.   Electronically Signed   By: Jorje Guild M.D.   On: 11/22/2013 00:03   Dg Hip Complete Left  11/22/2013   CLINICAL DATA:  Skin ulcer  EXAM: LEFT HIP - COMPLETE 2+ VIEW  COMPARISON:  09/27/2013  FINDINGS: No evidence of acute fracture or dislocation involving the hips. The pelvic ring is intact. There is advanced lower lumbar facet degeneration. Calcified uterine fibroid noted. Punctate high-density  material is chronically associated with the left gluteus musculature near the greater trochanter.  IMPRESSION: No acute osseous findings.   Electronically Signed   By: Jorje Guild M.D.   On: 11/22/2013 00:40    Positive for anxiety, bad mood and sleep disturbance Blood pressure 125/50, pulse 65, temperature 97.6 F (36.4 C), temperature source Oral, resp. rate 18, height _0  (1.676 m), weight 87.544 kg (193 lb), SpO2 100.00%.   Assessment/Plan: Depression secondary to [facility]  Recommendation:  Case will be  discussed with Jonetta Osgood, MD Patient does not have capacity to make medical decision as she can not understand her problems in depth and essential medical care and refusing to participate without reasoning.  Case discussed with patient daughter Renae Gloss At [phone] who agree that she needs to be cared in hospital for her medical problems Appreciate psych consult May call [phone] if needs further assistance  Noora Locascio,JANARDHAHA R. 11/22/2013, 2:18 PM

## 2013-11-22 NOTE — Progress Notes (Addendum)
INITIAL NUTRITION ASSESSMENT  DOCUMENTATION CODES Per approved criteria  -Obesity Unspecified   INTERVENTION:  1. Ensure complete PO BID between meals, with each 8 fl. ounce bottle providing 350 kcals and 13 grams of protein.  2. 2 week therapy 220 mg zinc sulfate daily and 500 mg Vitamin C BID supplementation to support wound healing; telephone order received.  3. MVI daily    NUTRITION DIAGNOSIS: Increased nutrient needs related to wounds as evidenced by estimated needs.  Goal: Pt to meet >/= 90% of their estimated needs.  Monitor:  PO intake, oral supplement acceptance, weight trends, labs  Reason for Assessment: Pt identified as having a low braden score.  80 y.o. female  Admitting Dx: Sacral decubitus ulcer, stage III  ASSESSMENT: Pt with hx of hypertension and renal insufficiency. Pt admitted with chief complaint of hip pain. Pt reports she has had hip pain for 7 weeks and has been laying on her couch and being not able to move or get around. Pt was recently hospitalized at [facility] but refused to go to rehab at d/c. Pt found at home by neighbor covered in urine and feces. Pt lives at home alone. Pt has developed a sacral decubitus ulcer, stage III. Pt complained about not liking to be interrupted from her rest every couple of hours while in the hospital. Pt reports not eating much at home due to not being mobile. Pt reports eating bread and other food at home, but "other food" and  quantity of intake was not specified as obtaining answers was difficult. Likely suspicion that pt food intake was low while at home. Pt reports that she did not want to eat much at home due to her not ambulate status. Figuring out how she gets fresh food from the store is still unanswered as getting pt answer was difficult to obtain. Pt observed to not have extreme fat or muscle mass depletion in the head and upper body region. Pt is willing to take ensure. Pt does not want to be put in the  SNF or other facilities and very concerned that people are trying to label her as crazy. Pt reports height of 5 feet 6 inches and usual body weight of 195 lbs.  Height: Ht Readings from Last 1 Encounters:  11/22/13 5\' 6"  (1.676 m)  Height per pt report.  Weight: Wt Readings from Last 1 Encounters:  11/22/13 193 lb (87.544 kg)    Ideal Body Weight: 130 lbs  % Ideal Body Weight: 149 %  Wt Readings from Last 10 Encounters:  11/22/13 193 lb (87.544 kg)  Pt reports that usual body weight is 195 lbs.  Usual Body Weight: 195 lbs  % Usual Body Weight: 99%  BMI:  Body mass index is 31.17 kg/(m^2).   Estimated Nutritional Needs: Kcal: 1800-2000 kcals Protein: 90-100 grams Fluid: >1.8 L/day  Skin: Sacral decubitus ulcer, stage III bilateral buttocks and wound on heel  Diet Order: Cardiac   EDUCATION NEEDS: -Education not appropriate at this time   Intake/Output Summary (Last 24 hours) at 11/22/13 1016 Last data filed at 11/22/13 0513  Gross per 24 hour  Intake 383.75 ml  Output      0 ml  Net 383.75 ml    Last BM: 1/14   Labs:   Recent Labs Lab 11/21/13 2153 11/22/13 0432  NA 139 138  K 5.8* 5.4*  CL 101 103  CO2 21 17*  BUN 44* 46*  CREATININE 1.50* 1.68*  CALCIUM 10.3 9.9  GLUCOSE 127* 114*    CBG (last 3)  No results found for this basename: GLUCAP,  in the last 72 hours  Scheduled Meds: . nebivolol  10 mg Oral Daily    Continuous Infusions: . sodium chloride 75 mL/hr at 11/22/13 0246    Past Medical History  Diagnosis Date  . Medical history unknown   . Hypertension   . Renal disorder     Past Surgical History  Procedure Laterality Date  . Surgical history unknown    . Cardiac surgery      cath  . Foot surgery      Marijean Niemann Dietetic Intern Pager: [address], LDN, CNSC [phone] Pager [phone] After Hours Pager

## 2013-11-22 NOTE — Progress Notes (Signed)
Patient complains of pain in her hip, will not accept pain medicine. Tried to remove soiled linens from underneath patient, but patient refused.

## 2013-11-22 NOTE — ED Notes (Signed)
Pt. Daughter called at approx. 11:15pm checking on her mother.  Explained that [facility].   Daughter understands what RN told her.

## 2013-11-22 NOTE — Evaluation (Signed)
Physical Therapy Evaluation Patient Details Name: Lindsay Browning MRN: [id number] DOB: 1919-11-05 Today's Date: 11/22/2013 Time: 4098-1191 PT Time Calculation (min): 19 min  PT Assessment / Plan / Recommendation History of Present Illness  Pt. found by neighbors lying on couch in urine and feces.  Pt. had recent admission to [facility] and was found to have L gluteal tendon abnormality with bursitis.  Since time of DC, it appears that pt. has been nonambulatory and lying on couch for some extended amount of time.  She has infected sacral decubitus, bursitis L hip, suspected CKD III, HTN.  She has now had psych consult who has deemed her without capacity to make medical decisions.  PT is asked to evaluate.  Clinical Impression  Unfortunately, pt is not able to understand the need to participate in PT and is totally resistant to any attempt for therapist to assist her in mobility .  She seems suspicious and paranoid of the intent of this and other caregivers and says that "your president doesn't want old people to live past 85".  She seems angry when requests are made of her and when asked to"squeeze my hand", she became angry and almost struck at therapist with her left hand.  Her upper body strength seems good for self feeding, etc. But she would not allow any lower body mobility or ROM for further assessment.  I will sign off.  If pt. Becomes more able to participate, please notify PT and we will be glad to reassess at that time.      PT Assessment  Patent does not need any further PT services    Follow Up Recommendations  SNF;Supervision/Assistance - 24 hour;Supervision for mobility/OOB    Does the patient have the potential to tolerate intense rehabilitation      Barriers to Discharge        Equipment Recommendations  Other (comment) (TBD when/if her function and participation improves)    Recommendations for Other Services     Frequency      Precautions / Restrictions  Precautions Precautions: Fall;Other (comment) (sacral ulcer) Precaution Comments: Pt. wants to remain R sidelying throughout session ; is not amenable to being repositioned. Restrictions Weight Bearing Restrictions: No   Pertinent Vitals/Pain See vitals tab       Mobility  Bed Mobility Overal bed mobility: +2 for physical assistance (per RN report  in order to roll pt. for hygiene) General bed mobility comments: pt. would not attempt to roll at request of therapist, stating repeatedly that there was nothing wrong with her legs or ability to roll but that she wouldn't roll due to L hip "nerve pain" Transfers Overall transfer level:  (unable to assess) Ambulation/Gait Ambulation/Gait assistance:  (unable to assess)    Exercises     PT Diagnosis:    PT Problem List:   PT Treatment Interventions:       PT Goals(Current goals can be found in the care plan section)    Visit Information  Last PT Received On: 11/22/13 Assistance Needed: +1 (for limited in bed mobility; pt. would not permit full eval) History of Present Illness: Pt. found by neighbors lying on couch in urine and feces.  Pt. had recent admission to [facility] and was found to have L gluteal tendon abnormality with bursitis.  Since time of DC, it appears that pt. has been nonambulatory and lying on couch for some extended amount of time.  She has infected sacral decubitus, bursitis L hip, suspected CKD III,  HTN.  She has now had psych consult who has deemed her without capacity to make medical decisions.  PT is asked to evaluate.       Prior Functioning  Home Living Family/patient expects to be discharged to:: Unsure Additional Comments: suspect she will need SNF level care but maybe with psych intervention necessary Prior Function Level of Independence: Independent (pt. reports she was (I) prior to onset of hip issues) Communication Communication: No difficulties    Cognition  Cognition Arousal/Alertness:  Awake/alert Behavior During Therapy: Restless Overall Cognitive Status: No family/caregiver present to determine baseline cognitive functioning    Extremity/Trunk Assessment Upper Extremity Assessment Upper Extremity Assessment: Overall WFL for tasks assessed Lower Extremity Assessment Lower Extremity Assessment: Difficult to assess due to impaired cognition   Balance Balance Overall balance assessment:  (unable to assess) General Comments General comments (skin integrity, edema, etc.): Pt. has an infected sacral decubitus.  RN says wound is foul smelling.  End of Session PT - End of Session Activity Tolerance: Treatment limited secondary to agitation;Other (comment) (and noncompliance) Patient left: in bed;with call bell/phone within reach Nurse Communication: Other (comment) (lack pf ability to participate)  GP Functional Assessment Tool Used: clinical judgement/observation Functional Limitation: Mobility: Walking and moving around Mobility: Walking and Moving Around Current Status ([id number](G8978): 100 percent impaired, limited or restricted Mobility: Walking and Moving Around Goal Status ([id number](G8979): 100 percent impaired, limited or restricted Mobility: Walking and Moving Around Discharge Status [phone](G8980): 100 percent impaired, limited or restricted   Ferman HammingBlankenship, Cleave Ternes B 11/22/2013, 3:48 PM Weldon PickingSusan Chela Sutphen PT Acute Rehab Services [phone] [phone]

## 2013-11-22 NOTE — ED Notes (Signed)
Report given to Georg RuddleJustin RN at [facility]

## 2013-11-23 DIAGNOSIS — M7150 Other bursitis, not elsewhere classified, unspecified site: Secondary | ICD-10-CM

## 2013-11-23 LAB — CBC
HEMATOCRIT: 30.7 % — AB (ref 36.0–46.0)
Hemoglobin: 10.3 g/dL — ABNORMAL LOW (ref 12.0–15.0)
MCH: 31 pg (ref 26.0–34.0)
MCHC: 33.6 g/dL (ref 30.0–36.0)
MCV: 92.5 fL (ref 78.0–100.0)
Platelets: 276 10*3/uL (ref 150–400)
RBC: 3.32 MIL/uL — ABNORMAL LOW (ref 3.87–5.11)
RDW: 14.3 % (ref 11.5–15.5)
WBC: 13.3 10*3/uL — ABNORMAL HIGH (ref 4.0–10.5)

## 2013-11-23 MED ORDER — SODIUM CHLORIDE 0.9 % IV SOLN
INTRAVENOUS | Status: DC
  Administered 2013-11-23 – 2013-11-24 (×2): via INTRAVENOUS

## 2013-11-23 NOTE — Progress Notes (Addendum)
Clinical Social Work Department CLINICAL SOCIAL WORK PLACEMENT NOTE 11/23/2013  Patient:  Lindsay Browning,Lindsay Browning  Account Number:  [id number] Admit date:  11/21/2013  Clinical Social Worker:  Jetta LoutBAILEY MORGAN, Theresia MajorsLCSWA  Date/time:  11/23/2013 06:33 PM  Clinical Social Work is seeking post-discharge placement for this patient at the following level of care:   SKILLED NURSING   (*CSW will update this form in Epic as items are completed)   11/23/2013  Patient/family provided with [facility] Department of Clinical Social Work's list of facilities offering this level of care within the geographic area requested by the patient (or if unable, by the patient's family).  11/23/2013  Patient/family informed of their freedom to choose among providers that offer the needed level of care, that participate in Medicare, Medicaid or managed care program needed by the patient, have an available bed and are willing to accept the patient.  11/23/2013  Patient/family informed of [facility]' ownership interest in [facility], as well as of the fact that they are under no obligation to receive care at this facility.  PASARR submitted to EDS on 10/01/2013 PASARR number received from EDS on 10/01/2013  FL2 transmitted to all facilities in geographic area requested by pt/family on  11/23/2013 FL2 transmitted to all facilities within larger geographic area on 11/23/2013  Patient informed that his/her managed care company has contracts with or will negotiate with  certain facilities, including the following:     Patient/family informed of bed offers received: 11/29/2013  [facility] Physician recommends and patient chooses bed at    [facility] on  11/29/2013 [facility]  The following physician request were entered in Epic:   Additional Comments:

## 2013-11-23 NOTE — Progress Notes (Signed)
Clinical Social Work Department BRIEF PSYCHOSOCIAL ASSESSMENT 11/23/2013  Patient:  Lindsay Browning,Lindsay Browning     Account Number:  [id number]     Admit date:  11/21/2013  Clinical Social Worker:  Lindsay Browning,Lindsay Browning, LCSWA  Date/Time:  11/23/2013 06:22 PM  Referred by:  Physician  Date Referred:  11/22/2013 Referred for  SNF Placement   Other Referral:   Interview type:  Family Other interview type:    PSYCHOSOCIAL DATA Living Status:  ALONE Admitted from facility:   Level of care:   Primary support name:  Lindsay Browning [phone] Primary support relationship to patient:  CHILD, ADULT Degree of support available:   Very supportive. Lindsay Browning completed assessment via phone and lives in [place].    CURRENT CONCERNS  Other Concerns:    SOCIAL WORK ASSESSMENT / PLAN Clinical Social Worker (CSW) contacted patient's daughter listed on EPIC because per chart patient does not have capacity to make her own decisions. Daughter reported that she lives in [place] and her sister Lindsay Browning lives in [place]. Lindsay Browning reported that they plan on sharing the decision making power. Lindsay Browning gave CSW permission to fax patient to surrounding counties. CSW explained to daughter that since patient is being "aggressive and combative" per chart it will be difficult to find SNF placement. Lindsay Browning verbalized her understanding.    CSW spoke to MD who reported that the plan is for home with hospice if patient refuses treatment or SNF.   Assessment/plan status:  Psychosocial Support/Ongoing Assessment of Needs Other assessment/ plan:   Information/referral to community resources:    PATIENT'S/FAMILY'S RESPONSE TO PLAN OF CARE: Daughter thanked CSW for calling. Patient reported that [facility] [place] [facility] and care for her mother. Daughter reported that she is glad her sister Lindsay Browning is in [place].

## 2013-11-23 NOTE — Progress Notes (Signed)
PATIENT DETAILS Name: Lindsay Browning Age: 80 y.o. Sex: female Date of Birth: 11/14/1918 Admit Date: 11/21/2013 Admitting Physician Haydee Monica, MD ZOX:WRUEA, Sherilyn Cooter, MD  Subjective: Still very uncooperative. Refuses examination this morning. "You can't send me to a old age home!"  Assessment/Plan: Principal Problem:   Infected sacral decubitus-Unstageable - Difficult situation, Uncooperative will not let us examine properly to assess how bad the wound is. Per wound RN, who briefly examined, she has a unstageable sacral decubitus with a lot of necrotic area.  -On empiric vancomycin and Zosy-day 2 - Cannot pursue further work up-very uncooperative. Bigger question-these wounds will require long term wound care-given patient's uncooperativemess-it would be almost impossible for this wound to ever heal. -Had a long d/w patient's daughter Loleta Books in  [place] ([place] [phone])-who requested sedation for diagnostic tests/wound care etc-unfortunately although we can sedate her for MRI hip etc-we will not be able to sedate her every time for a wound dressing changes/hydrotherapy etc. Since this is a long term issue-would need patient cooperation in this matter, if not-then she is going to have worsening wounds, osteo. I have asked [name] to talk to her mother again today, to see if she can explain this to her. -unfortunately there are no good solutions in this matter-may need to involve hospice/palliative care.  Bursitis left hip - Consulted Dr. Magnus Ivan orthopedics to evaluate if patient is a candidate for  intra-articular steroid injection.However patient was uncooperative with him as well. - Apparently from the history obtained, patient was discharged recently from [facility], has been complaining of persistent left hip pain and as a result she has been nonambulatory. She now presents to our hospital with infected decubitus ulcer, apparently her neighbors found  her lying covered in urine and feces.  Suspected chronic kidney disease stage III - Unknown baseline. - Have asked RN to get medical records from [facility], however patient refused to sign a release of medical records form. - For now monitor electrolytes periodically, check a UA if patient will give [place] a sample-unfortunately refused to get BMET this am, lab tech was only able to get enough blood for a CBC.  Hyperkalemia - Treated with Kayexalate, recheck electrolytes in a.m-if patient cooperates  Hypertension - Continue with Bystolic  Social issues - Apparently discharged from [facility] a few weeks back-lives alone-has 2 daughters-[name] [place] in  [place] ([place] [phone]) and Marlynn [place]king who lives in [place] ([place] [phone]). Patient was found by her neighbors, lying in feces and urine. Apparently non-ambulatory due to severe left hip pain. -See by psychiatry -patient has NO capacity.  Disposition: Remain inpatient.  Long d/w daughter regarding disposition-family was already looking to get help at home before this hospitalization. Currently SNF/[facility]-psych are all options. Family also looking into getting other family members to be with her at home-with home health or hospice services. Have asked Social work/Case Management to talk with Loleta Books.  DVT Prophylaxis: Prophylactic Heparin  Code Status: Full code   Family Communication Spoke with Loleta Books over the phone, she lives in  [place] ([place] [phone]).   Procedures:  None  CONSULTS:  psychiatry and orthopedic surgery  Time spent 40 minutes-which includes 50% of the time with face-to-face with patient/ family and coordinating care related to the above assessment and plan.    MEDICATIONS: Scheduled Meds: . cefTAZidime (FORTAZ)  IV  1 g Intravenous Q24H  . feeding supplement (ENSURE COMPLETE)  237 mL Oral BID BM  .  heparin subcutaneous  5,000 Units Subcutaneous Q8H   . multivitamin with minerals  1 tablet Oral Daily  . nebivolol  10 mg Oral Daily  . sodium hypochlorite   Irrigation BID  . vancomycin  750 mg Intravenous Q24H  . vitamin C  500 mg Oral BID  . zinc sulfate  220 mg Oral Daily   Continuous Infusions: . sodium chloride     PRN Meds:.morphine injection, oxyCODONE-acetaminophen  Antibiotics: Anti-infectives   Start     Dose/Rate Route Frequency Ordered Stop   11/23/13 1300  vancomycin (VANCOCIN) IVPB 750 mg/150 ml premix     750 mg 150 mL/hr over 60 Minutes Intravenous Every 24 hours 11/22/13 1210     11/22/13 1300  cefTAZidime (FORTAZ) 1 g in dextrose 5 % 50 mL IVPB     1 g 100 mL/hr over 30 Minutes Intravenous Every 24 hours 11/22/13 1214     11/22/13 1230  vancomycin (VANCOCIN) 1,250 mg in sodium chloride 0.9 % 250 mL IVPB     1,250 mg 166.7 mL/hr over 90 Minutes Intravenous NOW 11/22/13 1210 11/22/13 1408       PHYSICAL EXAM: Vital signs in last 24 hours: Filed Vitals:   11/22/13 0900 11/22/13 1400 11/22/13 2035 11/23/13 1346  BP:  110/37 122/54 120/57  Pulse:  80 78 77  Temp:  97.8 F (36.6 C) 98 F (36.7 C) 98.7 F (37.1 C)  TempSrc:   Oral Oral  Resp:  16 18 18   Height: 5\' 6"  (1.676 m)     Weight: 87.544 kg (193 lb)     SpO2:  100% 99% 98%    Weight change:  Filed Weights   11/22/13 0900  Weight: 87.544 kg (193 lb)   Body mass index is 31.17 kg/(m^2).   Gen Exam: Awake and mostly alert with clear speech.  Still Very uncooperative- therefore limited exam Neck: Supple, No JVD.   Chest: B/L Clear.   CVS: S1 S2 Regular, no murmurs.  Abdomen: soft, BS +, non tender, non distended.  Extremities: no edema, lower extremities warm to touch. Neurologic: Non Focal.  Seems to move all 4 extremities   Intake/Output from previous day:  Intake/Output Summary (Last 24 hours) at 11/23/13 1356 Last data filed at 11/23/13 1300  Gross per 24 hour  Intake   1955 ml  Output      0 ml  Net   1955 ml     LAB  RESULTS: CBC  Recent Labs Lab 11/21/13 2153 11/23/13 0416  WBC 16.9* 13.3*  HGB 12.9 10.3*  HCT 38.6 30.7*  PLT 421* 276  MCV 93.9 92.5  MCH 31.4 31.0  MCHC 33.4 33.6  RDW 13.4 14.3  LYMPHSABS 1.9  --   MONOABS 1.0  --   EOSABS 0.0  --   BASOSABS 0.0  --     Chemistries   Recent Labs Lab 11/21/13 2153 11/22/13 0432  NA 139 138  K 5.8* 5.4*  CL 101 103  CO2 21 17*  GLUCOSE 127* 114*  BUN 44* 46*  CREATININE 1.50* 1.68*  CALCIUM 10.3 9.9    CBG: No results found for this basename: GLUCAP,  in the last 168 hours  GFR Estimated Creatinine Clearance: 23.8 ml/min (by C-G formula based on Cr of 1.68).  Coagulation profile No results found for this basename: INR, PROTIME,  in the last 168 hours  Cardiac Enzymes  Recent Labs Lab 11/21/13 2153  TROPONINI <0.30    No components found with  this basename: POCBNP,  No results found for this basename: DDIMER,  in the last 72 hours No results found for this basename: HGBA1C,  in the last 72 hours No results found for this basename: CHOL, HDL, LDLCALC, TRIG, CHOLHDL, LDLDIRECT,  in the last 72 hours No results found for this basename: TSH, T4TOTAL, FREET3, T3FREE, THYROIDAB,  in the last 72 hours No results found for this basename: VITAMINB12, FOLATE, FERRITIN, TIBC, IRON, RETICCTPCT,  in the last 72 hours No results found for this basename: LIPASE, AMYLASE,  in the last 72 hours  Urine Studies No results found for this basename: UACOL, UAPR, USPG, UPH, UTP, UGL, UKET, UBIL, UHGB, UNIT, UROB, ULEU, UEPI, UWBC, URBC, UBAC, CAST, CRYS, UCOM, BILUA,  in the last 72 hours  MICROBIOLOGY: No results found for this or any previous visit (from the past 240 hour(s)).  RADIOLOGY STUDIES/RESULTS: Dg Chest 2 View  11/22/2013   CLINICAL DATA:  Skin ulcer.  EXAM: CHEST  2 VIEW  COMPARISON:  None currently available  FINDINGS: No cardiomegaly. No acute infiltrate or edema. No effusion or pneumothorax. No acute osseous findings.   IMPRESSION: No active cardiopulmonary disease.   Electronically Signed   By: Tiburcio PeaJonathan  Watts M.D.   On: 11/22/2013 00:03   Dg Hip Complete Left  11/22/2013   CLINICAL DATA:  Skin ulcer  EXAM: LEFT HIP - COMPLETE 2+ VIEW  COMPARISON:  09/27/2013  FINDINGS: No evidence of acute fracture or dislocation involving the hips. The pelvic ring is intact. There is advanced lower lumbar facet degeneration. Calcified uterine fibroid noted. Punctate high-density material is chronically associated with the left gluteus musculature near the greater trochanter.  IMPRESSION: No acute osseous findings.   Electronically Signed   By: Tiburcio PeaJonathan  Watts M.D.   On: 11/22/2013 00:40    Jeoffrey MassedGHIMIRE,Rickesha Veracruz, MD  [facility] Pager:[phone]  If 7PM-7AM, please contact night-coverage [email or web address] Password [email or web address] 11/23/2013, 1:56 PM   LOS: 2 days

## 2013-11-23 NOTE — Progress Notes (Signed)
Lab tech unable to draw enough blood for ordered tests; was able to draw enough for CBC. Pt was agitated and refused a second attempt. Will move second lab draw (complete metabolic panel) to 0800 today.

## 2013-11-23 NOTE — Progress Notes (Signed)
Patient requested bedpan for use. RN and NT attempted to help get patient on bedpan, patient refused help and became agitated. Explanation was given for importance of use of bedpan and peri care in relevance to patients pressure ulcers. Patient became agressive and refused to allow RN and NT to assist her. Will continue to attempt to assist patient.

## 2013-11-23 NOTE — Progress Notes (Signed)
Nurse techs attempted to change pt's linen and provide peri-care. Pt was very aggressive during process and did not seem to comprehend reasoning for providing care. NT's able to change pad and apply barrier cream to pt's buttocks. Pt refused any attempt at any other wound care and dressing application. Will continue to monitor and attempt wound care at a later time.

## 2013-11-23 NOTE — Progress Notes (Signed)
Pt remains agitated and aggressive when attempting to provide wound care. When explained to pt that attempts at wound and peri care were in hopes of preventing further wounds on her buttocks and any infection, pt replied "I have no idea what you're talking about." Pt has also refused any oral intake from both RN and NT throughout this shift.

## 2013-11-24 LAB — CBC
HCT: 32 % — ABNORMAL LOW (ref 36.0–46.0)
HEMOGLOBIN: 10.4 g/dL — AB (ref 12.0–15.0)
MCH: 31 pg (ref 26.0–34.0)
MCHC: 32.5 g/dL (ref 30.0–36.0)
MCV: 95.2 fL (ref 78.0–100.0)
Platelets: 364 10*3/uL (ref 150–400)
RBC: 3.36 MIL/uL — ABNORMAL LOW (ref 3.87–5.11)
RDW: 14.1 % (ref 11.5–15.5)
WBC: 10 10*3/uL (ref 4.0–10.5)

## 2013-11-24 LAB — BASIC METABOLIC PANEL
BUN: 36 mg/dL — ABNORMAL HIGH (ref 6–23)
CHLORIDE: 105 meq/L (ref 96–112)
CO2: 16 mEq/L — ABNORMAL LOW (ref 19–32)
Calcium: 8.9 mg/dL (ref 8.4–10.5)
Creatinine, Ser: 1.23 mg/dL — ABNORMAL HIGH (ref 0.50–1.10)
GFR calc Af Amer: 43 mL/min — ABNORMAL LOW (ref >90)
GFR calc non Af Amer: 37 mL/min — ABNORMAL LOW (ref >90)
Glucose, Bld: 52 mg/dL — ABNORMAL LOW (ref 70–99)
POTASSIUM: 4.8 meq/L (ref 3.7–5.3)
Sodium: 139 mEq/L (ref 137–147)

## 2013-11-24 MED ORDER — LORAZEPAM 2 MG/ML IJ SOLN: 1.0000 mg | Freq: Once | INTRAMUSCULAR | Status: AC | PRN

## 2013-11-24 NOTE — Progress Notes (Signed)
Palliative Medicine consult received. We are pleased to assist in the care of this patient and will schedule a goals of care meeting at the earliest possible time we have a provider available.   Elizabeth Golding, DO Palliative Medicine  

## 2013-11-24 NOTE — Progress Notes (Signed)
Clinical Social Worker (CSW) contacted patient's daughter Loleta BooksSharice Horne [phone] to discuss D/C plan. Daughter reported that she wanted the patient to return home because patient is adamantly refusing to go to a facility. Daughter reported that she would like home health or home hospice services. CSW made case manager aware of above.   Jetta LoutBailey Morgan, LCSWA Weekend CSW [phone]

## 2013-11-24 NOTE — Progress Notes (Signed)
Discussed with patient the importance of moving to ordered air mattress to decrease pressure on her pressure wounds. Stressed that switching to this mattress would improve her comfort and help her heal. Patient continues to refuse. Will continue to discuss matter with patient.

## 2013-11-24 NOTE — Progress Notes (Signed)
Attempt PIV.  Unable to obtain.  POOR veins.  Both arms thoroughly assessed with ultrasound and no suitable veins found for PIV.  May consider PICC if wish to consider continuing IV antibotics.

## 2013-11-24 NOTE — Progress Notes (Signed)
Attempted to provide wound care. Patient very physically aggressive and uncooperative. With the assistance of several NT's, able to change linens, apply barrier cream and protective foam dressing.

## 2013-11-24 NOTE — Progress Notes (Signed)
Spoke with Daughter-[name]-over the phone-family is making arrangement for patient to go home with home health services-patient's behaviour continues to be erratic-uncooperative at times. Have lost IV Access-daughter agreeable for PICC and also to have discussion with palliative care-goals of care and disposition.

## 2013-11-24 NOTE — Progress Notes (Signed)
PATIENT DETAILS Name: Lindsay Browning Age: 80 y.o. Sex: female Date of Birth: 1919-02-13 Admit Date: 11/21/2013 Admitting Physician Haydee Monica, MD ZOX:[id number], Sherilyn Cooter, MD  Subjective: Less agitated this morning  Assessment/Plan: Principal Problem:   Infected sacral decubitus-Unstageable - Difficult situation, Uncooperative will not let us examine properly to assess how bad the wound is. Per wound RN, who briefly examined, she has a unstageable sacral decubitus with a lot of necrotic area.  -On empiric vancomycin and Elita Quick -day 3 - Cannot pursue further work up-very uncooperative. Bigger question-these wounds will require long term wound care-given patient's uncooperativemess-it would be almost impossible for this wound to ever heal. In any event, I would attempt a MRI of the pelvic area to assess for left hip pain and also to see if any underlying osteomyelitis given once. -Had a long d/w patient's daughter Loleta Books in  [place] on 1/17 (Tel [phone])-who requested sedation for diagnostic tests/wound care etc-unfortunately although we can sedate her for MRI hip etc-we will not be able to sedate her every time for a wound dressing changes/hydrotherapy etc. Since this is a long term issue-would need patient cooperation in this matter, if not-then she is going to have worsening wounds, osteo. I have asked [name] to talk to her mother again, to see if she can explain this to her. -unfortunately there are no good solutions in this matter-may need to involve hospice/palliative care if patient's behavior does not change.  Bursitis left hip - Consulted Dr. Magnus Ivan orthopedics to evaluate if patient is a candidate for  intra-articular steroid injection.However patient was uncooperative with him as well. I will attempt an MRI of the pelvis with mild sedation with as needed Ativan. - Apparently from the history obtained, patient was discharged recently from [facility], has been complaining of persistent left hip pain and as a result she has been nonambulatory. She now presents to our hospital with infected decubitus ulcer, apparently her neighbors found her lying covered in urine and feces.  Suspected chronic kidney disease stage III - Unknown baseline. - Have asked RN to get medical records from [facility], however patient refused to sign a release of medical records form. - For now monitor electrolytes periodically, check a UA if patient will give [place] a sample-unfortunately refused to get BMET this am, lab tech was only able to get enough blood for a CBC.  Hyperkalemia - Treated with Kayexalate, recheck electrolytes in a.m-if patient cooperates  Hypertension - Continue with Bystolic  Social issues - Apparently discharged from [facility] a few weeks back-lives alone-has 2 daughters-[name] [place] in  [place] ([place] [phone]) and Marlynn Perking who lives in [place] ([place] [phone]). Patient was found by her neighbors, lying in feces and urine. Apparently non-ambulatory due to severe left hip pain. -See by psychiatry -patient has NO capacity.  Disposition: Remain inpatient.  Long d/w daughter on 1/17 regarding disposition-family was already looking to get help at home before this hospitalization. Currently SNF/[facility]-psych are all options. Family also looking into getting other family members to be with her at home-with home health or hospice services. Have asked Social work/Case Management to talk with Loleta Books.  DVT Prophylaxis: Prophylactic Heparin  Code Status: Full code   Family Communication Spoke with Loleta Books over the phone, she lives in  [place] ([place] [phone]).   Procedures:  None  CONSULTS:  psychiatry and orthopedic surgery  Time spent 40 minutes-which includes 50% of the time with face-to-face with  patient/ family (over the phone) and coordinating care related to the above  assessment and plan.  MEDICATIONS: Scheduled Meds: . cefTAZidime (FORTAZ)  IV  1 g Intravenous Q24H  . feeding supplement (ENSURE COMPLETE)  237 mL Oral BID BM  . heparin subcutaneous  5,000 Units Subcutaneous Q8H  . multivitamin with minerals  1 tablet Oral Daily  . nebivolol  10 mg Oral Daily  . sodium hypochlorite   Irrigation BID  . vancomycin  750 mg Intravenous Q24H  . vitamin C  500 mg Oral BID  . zinc sulfate  220 mg Oral Daily   Continuous Infusions: . sodium chloride 75 mL/hr at 11/23/13 1359   PRN Meds:.LORazepam, morphine injection, oxyCODONE-acetaminophen  Antibiotics: Anti-infectives   Start     Dose/Rate Route Frequency Ordered Stop   11/23/13 1300  vancomycin (VANCOCIN) IVPB 750 mg/150 ml premix     750 mg 150 mL/hr over 60 Minutes Intravenous Every 24 hours 11/22/13 1210     11/22/13 1300  cefTAZidime (FORTAZ) 1 g in dextrose 5 % 50 mL IVPB     1 g 100 mL/hr over 30 Minutes Intravenous Every 24 hours 11/22/13 1214     11/22/13 1230  vancomycin (VANCOCIN) 1,250 mg in sodium chloride 0.9 % 250 mL IVPB     1,250 mg 166.7 mL/hr over 90 Minutes Intravenous NOW 11/22/13 1210 11/22/13 1408       PHYSICAL EXAM: Vital signs in last 24 hours: Filed Vitals:   11/22/13 2035 11/23/13 1346 11/23/13 2042 11/24/13 0612  BP:  120/57 127/48 121/50  Pulse: 78 77 76 75  Temp: 98 F (36.7 C) 98.7 F (37.1 C) 98.1 F (36.7 C) 98.5 F (36.9 C)  TempSrc: Oral Oral Oral Oral  Resp: 18 18 18 18   Height:      Weight:      SpO2: 99% 98% 100% 100%    Weight change:  Filed Weights   11/22/13 0900  Weight: 87.544 kg (193 lb)   Body mass index is 31.17 kg/(m^2).   Gen Exam: Awake and mostly alert with clear speech.  Still Very uncooperative- therefore limited exam Neck: Supple, No JVD.   Chest: B/L Clear.   CVS: S1 S2 Regular, no murmurs.  Abdomen: soft, BS +, non tender, non distended.  Extremities: no edema, lower extremities warm to touch. Neurologic: Non Focal.   Seems to move all 4 extremities   Intake/Output from previous day:  Intake/Output Summary (Last 24 hours) at 11/24/13 1015 Last data filed at 11/24/13 0814  Gross per 24 hour  Intake   1505 ml  Output      0 ml  Net   1505 ml     LAB RESULTS: CBC  Recent Labs Lab 11/21/13 2153 11/23/13 0416  WBC 16.9* 13.3*  HGB 12.9 10.3*  HCT 38.6 30.7*  PLT 421* 276  MCV 93.9 92.5  MCH 31.4 31.0  MCHC 33.4 33.6  RDW 13.4 14.3  LYMPHSABS 1.9  --   MONOABS 1.0  --   EOSABS 0.0  --   BASOSABS 0.0  --     Chemistries   Recent Labs Lab 11/21/13 2153 11/22/13 0432  NA 139 138  K 5.8* 5.4*  CL 101 103  CO2 21 17*  GLUCOSE 127* 114*  BUN 44* 46*  CREATININE 1.50* 1.68*  CALCIUM 10.3 9.9    CBG: No results found for this basename: GLUCAP,  in the last 168 hours  GFR Estimated Creatinine Clearance: 23.8 ml/min (  by C-G formula based on Cr of 1.68).  Coagulation profile No results found for this basename: INR, PROTIME,  in the last 168 hours  Cardiac Enzymes  Recent Labs Lab 11/21/13 2153  TROPONINI <0.30    No components found with this basename: POCBNP,  No results found for this basename: DDIMER,  in the last 72 hours No results found for this basename: HGBA1C,  in the last 72 hours No results found for this basename: CHOL, HDL, LDLCALC, TRIG, CHOLHDL, LDLDIRECT,  in the last 72 hours No results found for this basename: TSH, T4TOTAL, FREET3, T3FREE, THYROIDAB,  in the last 72 hours No results found for this basename: VITAMINB12, FOLATE, FERRITIN, TIBC, IRON, RETICCTPCT,  in the last 72 hours No results found for this basename: LIPASE, AMYLASE,  in the last 72 hours  Urine Studies No results found for this basename: UACOL, UAPR, USPG, UPH, UTP, UGL, UKET, UBIL, UHGB, UNIT, UROB, ULEU, UEPI, UWBC, URBC, UBAC, CAST, CRYS, UCOM, BILUA,  in the last 72 hours  MICROBIOLOGY: No results found for this or any previous visit (from the past 240 hour(s)).  RADIOLOGY  STUDIES/RESULTS: Dg Chest 2 View  11/22/2013   CLINICAL DATA:  Skin ulcer.  EXAM: CHEST  2 VIEW  COMPARISON:  None currently available  FINDINGS: No cardiomegaly. No acute infiltrate or edema. No effusion or pneumothorax. No acute osseous findings.  IMPRESSION: No active cardiopulmonary disease.   Electronically Signed   By: Tiburcio Pea M.D.   On: 11/22/2013 00:03   Dg Hip Complete Left  11/22/2013   CLINICAL DATA:  Skin ulcer  EXAM: LEFT HIP - COMPLETE 2+ VIEW  COMPARISON:  09/27/2013  FINDINGS: No evidence of acute fracture or dislocation involving the hips. The pelvic ring is intact. There is advanced lower lumbar facet degeneration. Calcified uterine fibroid noted. Punctate high-density material is chronically associated with the left gluteus musculature near the greater trochanter.  IMPRESSION: No acute osseous findings.   Electronically Signed   By: Tiburcio Pea M.D.   On: 11/22/2013 00:40    Jeoffrey Massed, MD  [facility] Pager:[phone]  If 7PM-7AM, please contact night-coverage [email or web address] Password [email or web address] 11/24/2013, 10:15 AM   LOS: 3 days

## 2013-11-25 DIAGNOSIS — Z515 Encounter for palliative care: Secondary | ICD-10-CM

## 2013-11-25 LAB — BASIC METABOLIC PANEL
BUN: 31 mg/dL — ABNORMAL HIGH (ref 6–23)
CHLORIDE: 106 meq/L (ref 96–112)
CO2: 13 meq/L — AB (ref 19–32)
CREATININE: 1.14 mg/dL — AB (ref 0.50–1.10)
Calcium: 9.2 mg/dL (ref 8.4–10.5)
GFR calc Af Amer: 47 mL/min — ABNORMAL LOW (ref >90)
GFR calc non Af Amer: 40 mL/min — ABNORMAL LOW (ref >90)
GLUCOSE: 78 mg/dL (ref 70–99)
Potassium: 5.2 mEq/L (ref 3.7–5.3)
Sodium: 138 mEq/L (ref 137–147)

## 2013-11-25 MED ORDER — SODIUM POLYSTYRENE SULFONATE 15 GM/60ML PO SUSP
30.0000 g | Freq: Once | ORAL | Status: DC
  Filled 2013-11-25: qty 120

## 2013-11-25 MED ORDER — LORAZEPAM 1 MG PO TABS: 1.0000 mg | ORAL_TABLET | Freq: Once | ORAL | Status: AC | PRN

## 2013-11-25 MED ORDER — CIPROFLOXACIN HCL 500 MG PO TABS
500.0000 mg | ORAL_TABLET | Freq: Two times a day (BID) | ORAL | Status: DC
  Administered 2013-11-26 – 2013-11-27 (×2): 500 mg via ORAL
  Filled 2013-11-25 (×10): qty 1

## 2013-11-25 MED ORDER — SENNOSIDES-DOCUSATE SODIUM 8.6-50 MG PO TABS: 2.0000 | ORAL_TABLET | Freq: Every evening | ORAL | Status: DC | PRN

## 2013-11-25 MED ORDER — DEXTROSE 5 % IV SOLN
1.0000 g | INTRAVENOUS | Status: DC
  Filled 2013-11-25: qty 1

## 2013-11-25 MED ORDER — LORAZEPAM 2 MG/ML IJ SOLN: 1.0000 mg | Freq: Once | INTRAMUSCULAR | Status: AC | PRN

## 2013-11-25 MED ORDER — DOXYCYCLINE HYCLATE 100 MG PO TABS
100.0000 mg | ORAL_TABLET | Freq: Two times a day (BID) | ORAL | Status: DC
  Administered 2013-11-26 – 2013-11-27 (×2): 100 mg via ORAL
  Filled 2013-11-25 (×10): qty 1

## 2013-11-25 NOTE — Consult Note (Signed)
Patient [name]      DOB: 07-09-1919      WUJ:[id number]     Consult Note from the Palliative Medicine Team at [facility]    Consult Requested by: Dr. Jerral Ralph     PCP: Florentina Jenny, MD Reason for Consultation: Clarification GOC and options.  Phone Number:[phone]  Assessment of patients Current state: Lindsay Browning is a 80 yo female with bilateral buttock decubitus ulcers. She was found by neighbors lying in urine/feces and was bedridden due to severe left hip pain. She has been seen by psychiatry and does not have capacity for decision making. She is insistent about going home and refuses care from the staff. She is quite paranoid and explains many conspiracy theories to me while in the room (one theory about the government killing anyone over 39 yo and this includes cooperation between hospitals and "old folks homes"). 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 She would not allow me to speak while I was visiting with her.    I also spoke via telephone with her daughter Lindsay Browning who lives in [place]. We discussed how difficult it will be for her wounds to heal if she continues to be uncooperative. She understands that if she does not let us cleanse her wounds it will extend deeper and cause greater infection - she is understanding of this concept. We discussed quality of life for Lindsay Browning and Lindsay Browning says that if she is unable to get on the bus and go she will be miserable (she visits with workers at [employer] and they usually provide her lunch). We discussed the high possibility that she will not be able to get out and on the bus. We discussed comfort care and letting her stay at home (with 24/7 care) while treating her pain and agitation, letting her eat and drink what she wants, and keeping her out of the hospital. Lindsay Browning is interested in comfort care but wants to get the results of MRI before making any decisions. We  discussed code status and Lindsay Browning says her mother has always been clear she wants everything done - I did explain the limited benefit of CPR/intubation and the likelihood of Lindsay Browning being left on life support with Lindsay Browning having to decide to take her off life support. Lindsay Browning continues to want her to be a full code and is willing to make those decisions if needed. Lindsay Browning is also working on providing caregivers for her mother to return to home.  Lindsay Browning wishes for her mother to be sedated for MRI and is hoping we could inject her burstitis and better assess her decubitus ulcers while sedated. I will continue to follow and elicit goals.   Goals of Care: 1.  Code Status: FULL   2. Scope of Treatment: Continue scope of treatment for now. I will continue to elicit goals.    4. Disposition: Home with caregivers and possibly hospice.    3. Symptom Management:   1. Anxiety/Agitation: Ativan prior to procedures.  2. Pain: Morphine prn severe pain. Percocet prn moderate pain.  3. Bowel Regimen: Senna S prn.  4. Weakness: Continue medical management. PT would be helpful if patient would cooperate.  4. Psychosocial: Emotional support provided to patient and daughter Lindsay Browning.   Brief HPI: 80 yo female with decubitus ulcers.   ROS: Unable to elicit - unwilling to follow commands, reason, or answer questions.    PMH:  Past Medical History  Diagnosis Date  . Medical history unknown   . Hypertension   . Renal disorder      PSH: Past Surgical History  Procedure Laterality Date  . Surgical history unknown    . Cardiac surgery      cath  . Foot surgery     I have reviewed the FH and SH and  If appropriate update it with new information. Allergies  Allergen Reactions  . Penicillins Other (See Comments)    Unknown - pt states she's never taken PCN before   Scheduled Meds: . cefTAZidime (FORTAZ)  IV  1 g Intravenous Q24H  . ciprofloxacin  500 mg Oral BID  . doxycycline  100 mg  Oral Q12H  . feeding supplement (ENSURE COMPLETE)  237 mL Oral BID BM  . heparin subcutaneous  5,000 Units Subcutaneous Q8H  . multivitamin with minerals  1 tablet Oral Daily  . nebivolol  10 mg Oral Daily  . sodium hypochlorite   Irrigation BID  . sodium polystyrene  30 g Oral Once  . vancomycin  750 mg Intravenous Q24H  . vitamin C  500 mg Oral BID  . zinc sulfate  220 mg Oral Daily   Continuous Infusions: . sodium chloride 40 mL/hr at 11/24/13 1042   PRN Meds:.LORazepam, LORazepam, morphine injection, oxyCODONE-acetaminophen    BP 125/44  Pulse 69  Temp(Src) 97.8 F (36.6 C) (Oral)  Resp 18  Ht 5\' 6"  (1.676 m)  Wt 87.544 kg (193 lb)  BMI 31.17 kg/m2  SpO2 99%   PPS: 30% at best   Intake/Output Summary (Last 24 hours) at 11/25/13 1508 Last data filed at 11/25/13 1009  Gross per 24 hour  Intake    120 ml  Output      0 ml  Net    120 ml   LBM: 11/20/13                     Physical Exam:  General: NAD, well nourished, talkative HEENT: [place]/AT, no JVD Chest: CTA throughout CVS: RRR Abdomen: Soft, NT, ND Ext: No edema Neuro: Alert, oriented to person and place only.  Psych: Paranoid in speech, mistrusting of those trying to help her and health systems. Believes in herbal remedies.   Labs: CBC    Component Value Date/Time   WBC 10.0 11/24/2013 0610   RBC 3.36* 11/24/2013 0610   HGB 10.4* 11/24/2013 0610   HCT 32.0* 11/24/2013 0610   PLT 364 11/24/2013 0610   MCV 95.2 11/24/2013 0610   MCH 31.0 11/24/2013 0610   MCHC 32.5 11/24/2013 0610   RDW 14.1 11/24/2013 0610   LYMPHSABS 1.9 11/21/2013 2153   MONOABS 1.0 11/21/2013 2153   EOSABS 0.0 11/21/2013 2153   BASOSABS 0.0 11/21/2013 2153    BMET    Component Value Date/Time   NA 138 11/25/2013 0525   K 5.2 11/25/2013 0525   CL 106 11/25/2013 0525   CO2 13* 11/25/2013 0525   GLUCOSE 78 11/25/2013 0525   BUN 31* 11/25/2013 0525   CREATININE 1.14* 11/25/2013 0525   CALCIUM 9.2 11/25/2013 0525   GFRNONAA 40* 11/25/2013  0525   GFRAA 47* 11/25/2013 0525    CMP     Component Value Date/Time   NA 138 11/25/2013 0525   K 5.2 11/25/2013 0525   CL 106 11/25/2013 0525   CO2 13* 11/25/2013 0525   GLUCOSE 78 11/25/2013 0525   BUN 31* 11/25/2013 0525   CREATININE 1.14* 11/25/2013 0525  CALCIUM 9.2 11/25/2013 0525   PROT 7.6 11/21/2013 2153   ALBUMIN 2.3* 11/21/2013 2153   AST 22 11/21/2013 2153   ALT 7 11/21/2013 2153   ALKPHOS 89 11/21/2013 2153   BILITOT 0.4 11/21/2013 2153   GFRNONAA 40* 11/25/2013 0525   GFRAA 47* 11/25/2013 0525     Time In Time Out Total Time Spent with Patient Total Overall Time  1400 1515 60min 75min    Greater than 50%  of this time was spent counseling and coordinating care related to the above assessment and plan.  Yong ChannelAlicia Danija Gosa, NP Palliative Medicine Team Team Phone # [phone]

## 2013-11-25 NOTE — Progress Notes (Signed)
Last night Pt wanted to get up to use BSC but refused moving help.  Attempted to get up by herself but was unable to get off the bed. She also took off the dressing on her pressure ulcers stating," I don't need this".  Offer bedpan to pt but refused as well. This morning Pt still refused to be clean and dry.  Will attempt again.

## 2013-11-25 NOTE — Progress Notes (Signed)
PATIENT DETAILS Name: Lindsay Browning Age: 80 y.o. Sex: female Date of Birth: 08-11-1919 Admit Date: 11/21/2013 Admitting Physician Haydee Monica, MD WUJ:WJXBJ, Sherilyn Cooter, MD  Apparently discharged from [facility] a few weeks back-lives alone-has 2 daughters-Lindsay Browning in  [place] ([place] [phone]) and Lindsay Browning who lives in [address] (Te [phone]). Patient was found by her neighbors, lying in feces and urine. Apparently non-ambulatory due to severe left hip pain.  Patient has been seen by psychiatry and does not have capacity.  Patient reports too much pain with exam, blood draws, etc... To be cooperative.  Current plan is home with hospice care if agreeable to patient/family.    MRI left hip is pending to rule out Osteo.  PICC access is pending (no IV currently).  At home antibiotics / duration will be determined by MRI or Hospice.   Subjective: Complains of great pain with any touch or movement to the left hip.  Very talkative.  Assessment/Plan: Principal Problem:   Infected sacral decubitus-Unstageable - Difficult situation, Uncooperative will not let us examine properly to assess how bad the wound is due to pain. - Per wound RN, who briefly examined, she has a unstageable sacral decubitus with a lot of necrotic area.  - On empiric vancomycin and Fortaz -day 4-but currently no IV access. Will start oral antibiotics-Cipro/Doxy till IV accessed can be placed. - Cannot pursue further work up-very uncooperative and completely lacks insight.   -Bigger question-these wounds will require long term wound care-given patient's uncooperativeness, it would be almost impossible for this wound to ever heal. -MRI of the pelvic area ordered to assess for left hip pain and also to see if any underlying osteomyelitis given once. -Dr Jerral Ralph had a long d/w patient's daughter Lindsay Browning in  [place] on 1/17 [facility] [phone])-who requested sedation for diagnostic tests/wound  care etc-unfortunately although we can sedate her for MRI hip etc-we will not be able to sedate her every time for a wound dressing changes/hydrotherapy etc. Since this is a long term issue-would need patient cooperation in this matter, if not-then she is going to have worsening wounds, osteo. I have asked Lindsay to talk to her mother again, to see if she can explain this to her.  -Patient wants very badly to return to her own home.  If we are unable to treat her in the hospital she will need to go home with hospice care.  - comfort measures only.  Bursitis left hip - Consulted Dr. Magnus Ivan orthopedics to evaluate if patient is a candidate for  intra-articular steroid injection.However patient was uncooperative with him as well. I will attempt an MRI of the pelvis with mild sedation with as needed Ativan. - Apparently from the history obtained, patient was discharged recently from [facility], has been complaining of persistent left hip pain and as a result she has been nonambulatory. She now presents to our hospital with infected decubitus ulcer, apparently her neighbors found her lying covered in urine and feces.  Suspected chronic kidney disease stage III - Unknown baseline. - Have asked RN to get medical records from [facility], however patient refused to sign a release of medical records form. - For now monitor electrolytes periodically, check a UA if patient will give [place] a sample  Hyperkalemia - Again, treated with Kayexalate, recheck electrolytes in a.m  Hypertension - Continue with Bystolic  Social issues - Apparently discharged from [facility] a few weeks back-lives alone-has 2  daughters-Lindsay Browning in  [place] ([place] [phone]) and Lindsay Browning who lives in [place] ([place] [phone]). Patient was found by her neighbors, lying in feces and urine. Apparently non-ambulatory due to severe left hip pain. -See by psychiatry -patient has NO  capacity.  Disposition: Remain inpatient. Current plan if for home with hospice care.  Palliative medicine consult pending.  Long d/w daughter on 1/17 regarding disposition-family was already looking to get help at home before this hospitalization. Currently SNF/[facility]-psych are all options. Family also looking into getting other family members to be with her at home-with home health or hospice services. Have asked Social work/Case Management to talk with Lindsay Browning.  DVT Prophylaxis: Prophylactic Heparin  Code Status: Full code   Family Communication Spoke with Lindsay Browning over the phone, she lives in  [place] ([place] [phone]).   Procedures:  None  CONSULTS:  psychiatry and orthopedic surgery Palliative Medicine.  Time spent 45 minutes listening to patient talk.  Total 60 min.  MEDICATIONS: Scheduled Meds: . cefTAZidime (FORTAZ)  IV  1 g Intravenous Q24H  . feeding supplement (ENSURE COMPLETE)  237 mL Oral BID BM  . heparin subcutaneous  5,000 Units Subcutaneous Q8H  . multivitamin with minerals  1 tablet Oral Daily  . nebivolol  10 mg Oral Daily  . sodium hypochlorite   Irrigation BID  . sodium polystyrene  30 g Oral Once  . vancomycin  750 mg Intravenous Q24H  . vitamin C  500 mg Oral BID  . zinc sulfate  220 mg Oral Daily   Continuous Infusions: . sodium chloride 40 mL/hr at 11/24/13 1042   PRN Meds:.LORazepam, LORazepam, morphine injection, oxyCODONE-acetaminophen  Antibiotics: Anti-infectives   Start     Dose/Rate Route Frequency Ordered Stop   11/23/13 1300  vancomycin (VANCOCIN) IVPB 750 mg/150 ml premix     750 mg 150 mL/hr over 60 Minutes Intravenous Every 24 hours 11/22/13 1210     11/22/13 1300  cefTAZidime (FORTAZ) 1 g in dextrose 5 % 50 mL IVPB     1 g 100 mL/hr over 30 Minutes Intravenous Every 24 hours 11/22/13 1214     11/22/13 1230  vancomycin (VANCOCIN) 1,250 mg in sodium chloride 0.9 % 250 mL IVPB     1,250 mg 166.7 mL/hr over 90  Minutes Intravenous NOW 11/22/13 1210 11/22/13 1408       PHYSICAL EXAM: Vital signs in last 24 hours: Filed Vitals:   11/23/13 1346 11/23/13 2042 11/24/13 0612 11/24/13 1441  BP: 120/57 127/48 121/50 125/44  Pulse: 77 76 75 69  Temp: 98.7 F (37.1 C) 98.1 F (36.7 C) 98.5 F (36.9 C) 97.8 F (36.6 C)  TempSrc: Oral Oral Oral   Resp: 18 18 18 18   Height:      Weight:      SpO2: 98% 100% 100% 99%    Weight change:  Filed Weights   11/22/13 0900  Weight: 87.544 kg (193 lb)   Body mass index is 31.17 kg/(m^2).   Gen Exam: Awake and mostly alert with clear speech.  Very talkative.   Neck: Supple, No JVD.   Chest: B/L Clear.   CVS: S1 S2 Regular, occassional irregular beats, no murmurs.  Abdomen: soft, BS +, non tender, non distended.  Extremities: no edema, lower extremities warm to touch. Able to move all 4, but each VERY TENDER to palpation. Psych:  Patient completely lacks insight, very independent spirit.  Believes she has been harmed repeatedly by following medical advice.  Trusts in taking  herbal supplements.   Intake/Output from previous day:  Intake/Output Summary (Last 24 hours) at 11/25/13 1037 Last data filed at 11/25/13 1009  Gross per 24 hour  Intake    240 ml  Output      0 ml  Net    240 ml     LAB RESULTS: CBC  Recent Labs Lab 11/21/13 2153 11/23/13 0416 11/24/13 0610  WBC 16.9* 13.3* 10.0  HGB 12.9 10.3* 10.4*  HCT 38.6 30.7* 32.0*  PLT 421* 276 364  MCV 93.9 92.5 95.2  MCH 31.4 31.0 31.0  MCHC 33.4 33.6 32.5  RDW 13.4 14.3 14.1  LYMPHSABS 1.9  --   --   MONOABS 1.0  --   --   EOSABS 0.0  --   --   BASOSABS 0.0  --   --     Chemistries   Recent Labs Lab 11/21/13 2153 11/22/13 0432 11/24/13 0610 11/25/13 0525  NA 139 138 139 138  K 5.8* 5.4* 4.8 5.2  CL 101 103 105 106  CO2 21 17* 16* 13*  GLUCOSE 127* 114* 52* 78  BUN 44* 46* 36* 31*  CREATININE 1.50* 1.68* 1.23* 1.14*  CALCIUM 10.3 9.9 8.9 9.2     Cardiac  Enzymes  Recent Labs Lab 11/21/13 2153  TROPONINI <0.30     RADIOLOGY STUDIES/RESULTS: Dg Chest 2 View  11/22/2013   CLINICAL DATA:  Skin ulcer.  EXAM: CHEST  2 VIEW  COMPARISON:  None currently available  FINDINGS: No cardiomegaly. No acute infiltrate or edema. No effusion or pneumothorax. No acute osseous findings.  IMPRESSION: No active cardiopulmonary disease.   Electronically Signed   By: Tiburcio PeaJonathan  Watts M.D.   On: 11/22/2013 00:03   Dg Hip Complete Left  11/22/2013   CLINICAL DATA:  Skin ulcer  EXAM: LEFT HIP - COMPLETE 2+ VIEW  COMPARISON:  09/27/2013  FINDINGS: No evidence of acute fracture or dislocation involving the hips. The pelvic ring is intact. There is advanced lower lumbar facet degeneration. Calcified uterine fibroid noted. Punctate high-density material is chronically associated with the left gluteus musculature near the greater trochanter.  IMPRESSION: No acute osseous findings.   Electronically Signed   By: Tiburcio PeaJonathan  Watts M.D.   On: 11/22/2013 00:40    Conley CanalYork, Marianne L, PA-C [facility] Pager:[phone]  If 7PM-7AM, please contact night-coverage [email or web address] Password [email or web address] 11/25/2013, 10:37 AM   LOS: 4 days   Attending - Seen and examined at, agree with the above assessment and plan. Patient uncooperative behavior continues, we still have my access. Difficult situation. Given patient behavior-do not think she would have a good long-term outcome,  wound will only get worse. Family is aware. Have consulted palliative care for goals of care/disposition. Since no IV line, will start temporary oral antibiotics to a PICC line can be placed.  Windell NorfolkS Ghimire MD

## 2013-11-25 NOTE — Care Management Note (Addendum)
CARE MANAGEMENT NOTE 11/25/2013  Patient:  Lindsay Browning,Lindsay Browning   Account Number:  [id number]  Date Initiated:  11/25/2013  Documentation initiated by:  Vance PeperBRADY,Gorgeous Newlun  Subjective/Objective Assessment:   80 yr old female admitted with bilateral buttock decubitus.     Action/Plan:   CM spoke with patient concerning HH needs and care at discharge.Unable to leave voicemail for daughter [name]-[phone]. CM will follow   Anticipated DC Date:  11/27/2013   Anticipated DC Plan:        DC Planning Services  CM consult      Choice offered to / List presented to:             Status of service:  In process, will continue to follow

## 2013-11-25 NOTE — Progress Notes (Signed)
Patient refusing her kayexalate. MD was notified and is aware.

## 2013-11-25 NOTE — Progress Notes (Signed)
During bed change patient became combative; at times hitting and screaming at nursing staff. However we were able to successfully change her sheets, perform peri care and assess and change the dressings on her decubitus ulcers.

## 2013-11-25 NOTE — Progress Notes (Signed)
Patient with no IV access. Per IV team, PICC line cannot be placed today due to scheduling. They will add her to the list for tomorrow. MD was notified and is aware.

## 2013-11-25 NOTE — Progress Notes (Signed)
ANTIBIOTIC CONSULT NOTE - FOLLOW UP  Pharmacy Consult for Vancomycin and Ceftazidime Indication: sacral decub  Allergies  Allergen Reactions  . Penicillins Other (See Comments)    Unknown - pt states she's never taken PCN before    Patient Measurements: Height: 5\' 6"  (167.6 cm) Weight: 193 lb (87.544 kg) IBW/kg (Calculated) : 59.3  Vital Signs:   Intake/Output from previous day: 01/18 0701 - 01/19 0700 In: 240 [P.O.:240] Out: -  Intake/Output from this shift: Total I/O In: 120 [P.O.:120] Out: -   Labs:  Recent Labs  11/23/13 0416 11/24/13 0610 11/25/13 0525  WBC 13.3* 10.0  --   HGB 10.3* 10.4*  --   PLT 276 364  --   CREATININE  --  1.23* 1.14*   Estimated Creatinine Clearance: 35.1 ml/min (by C-G formula based on Cr of 1.14).    Microbiology: No results found for this or any previous visit (from the past 720 hour(s)).  Anti-infectives   Start     Dose/Rate Route Frequency Ordered Stop   11/23/13 1300  vancomycin (VANCOCIN) IVPB 750 mg/150 ml premix     750 mg 150 mL/hr over 60 Minutes Intravenous Every 24 hours 11/22/13 1210     11/22/13 1300  cefTAZidime (FORTAZ) 1 g in dextrose 5 % 50 mL IVPB     1 g 100 mL/hr over 30 Minutes Intravenous Every 24 hours 11/22/13 1214     11/22/13 1230  vancomycin (VANCOCIN) 1,250 mg in sodium chloride 0.9 % 250 mL IVPB     1,250 mg 166.7 mL/hr over 90 Minutes Intravenous NOW 11/22/13 1210 11/22/13 1408      Assessment: 80 yo F continues on antibiotics day#3 for sacral wound infection.  Pt is confused and has been noncooperative with staff with regards to dressing changes and wound management.  Currently has lost IV access with plans for PICC placement.  Renal function has improved with hydration, SCr 1.68 >> 1.14.   Noted plans for palliative care consult.  Goal of Therapy:  Vancomycin trough level 10-15 mcg/ml  Plan:  Continue Vancomycin 750 mg IV q24h and Ceftazidime 1 g IV q24h  Follow up restart of IV and  adjust times of abx doses if needed. Follow up pall care discussion and need for antibiotic drug levels if therapy continues.   Toys 'R' UsKimberly Jadzia Browning, Pharm.D., BCPS Clinical Pharmacist Pager [phone] 11/25/2013 1:07 PM

## 2013-11-26 LAB — BASIC METABOLIC PANEL
BUN: 26 mg/dL — ABNORMAL HIGH (ref 6–23)
CO2: 17 mEq/L — ABNORMAL LOW (ref 19–32)
Calcium: 9.2 mg/dL (ref 8.4–10.5)
Chloride: 104 mEq/L (ref 96–112)
Creatinine, Ser: 1.15 mg/dL — ABNORMAL HIGH (ref 0.50–1.10)
GFR, EST AFRICAN AMERICAN: 46 mL/min — AB (ref >90)
GFR, EST NON AFRICAN AMERICAN: 40 mL/min — AB (ref >90)
Glucose, Bld: 78 mg/dL (ref 70–99)
POTASSIUM: 4.7 meq/L (ref 3.7–5.3)
Sodium: 138 mEq/L (ref 137–147)

## 2013-11-26 LAB — LACTIC ACID, PLASMA: Lactic Acid, Venous: 0.9 mmol/L (ref 0.5–2.2)

## 2013-11-26 LAB — CK: CK TOTAL: 67 U/L (ref 7–177)

## 2013-11-26 MED ORDER — LORAZEPAM 1 MG PO TABS: 1.0000 mg | ORAL_TABLET | Freq: Once | ORAL | Status: AC | PRN

## 2013-11-26 MED ORDER — LORAZEPAM 2 MG/ML IJ SOLN
1.0000 mg | Freq: Once | INTRAMUSCULAR | Status: AC | PRN
  Administered 2013-11-26: 1 mg via INTRAMUSCULAR

## 2013-11-26 MED ORDER — FENTANYL 12 MCG/HR TD PT72
12.5000 ug | MEDICATED_PATCH | TRANSDERMAL | Status: DC
  Administered 2013-11-26: 12.5 ug via TRANSDERMAL
  Filled 2013-11-26 (×2): qty 1

## 2013-11-26 MED ORDER — WHITE PETROLATUM GEL
Status: AC
  Administered 2013-11-26: 13:00:00
  Filled 2013-11-26: qty 5

## 2013-11-26 MED ORDER — LORAZEPAM 2 MG/ML IJ SOLN
INTRAMUSCULAR | Status: AC
  Filled 2013-11-26: qty 1

## 2013-11-26 MED ORDER — SENNOSIDES-DOCUSATE SODIUM 8.6-50 MG PO TABS
2.0000 | ORAL_TABLET | Freq: Every day | ORAL | Status: DC
  Administered 2013-11-26 – 2013-11-27 (×2): 2 via ORAL
  Filled 2013-11-26 (×2): qty 2

## 2013-11-26 NOTE — Progress Notes (Signed)
NUTRITION FOLLOW UP  Intervention:   Continue to encourage PO intake at meals and of Ensure Complete.  Continue MVI and Vitamin C and Zinc  Nutrition Dx:   Increased nutrient needs related to wounds as evidenced by estimated needs; ongoing.   Goal:  Pt to meet >/= 90% of their estimated needs, not met.   Monitor:  PO intake, oral supplement acceptance, weight trends, labs  Goal:   Pt to meet >/= 90% of their estimated nutrition needs   Monitor:   PO intake, supplement acceptance, weight trend, labs  Assessment:   Pt with hx of hypertension and renal insufficiency. Pt admitted with chief complaint of hip pain. Pt reports she has had hip pain for 7 weeks and has been laying on her couch and being not able to move or get around. Pt was recently hospitalized at [facility] but refused to go to rehab at d/c. Pt found at home by neighbor covered in urine and feces. Pt lives at home alone. Pt has developed a sacral decubitus ulcer, stage III.   Pt has continued to refuse care. Palliative care consulted and is following up with patient and daughter. Intake has been poor. Team unable to provide tests/care needed due to refusal. Per psych pt lacks capacity.   Height: Ht Readings from Last 1 Encounters:  11/22/13 $RemoveB'5\' 6"'TwYqirAt$  (1.676 m)    Weight Status:   Wt Readings from Last 1 Encounters:  11/22/13 193 lb (87.544 kg)    Re-estimated needs:  Kcal: 1800-2000 kcals  Protein: 90-100 grams  Fluid: >1.8 L/day  Skin: Sacral decubitus ulcer, stage III bilateral buttocks and wound on heel  Diet Order: Cardiac Meal Completion: 15-75%   Intake/Output Summary (Last 24 hours) at 11/26/13 1414 Last data filed at 11/26/13 1200  Gross per 24 hour  Intake    240 ml  Output      0 ml  Net    240 ml    Last BM: PTA   Labs:   Recent Labs Lab 11/24/13 0610 11/25/13 0525 11/26/13 0905  NA 139 138 138  K 4.8 5.2 4.7  CL 105 106 104  CO2 16* 13* 17*  BUN 36* 31* 26*  CREATININE  1.23* 1.14* 1.15*  CALCIUM 8.9 9.2 9.2  GLUCOSE 52* 78 78    CBG (last 3)  No results found for this basename: GLUCAP,  in the last 72 hours  Scheduled Meds: . cefTAZidime (FORTAZ)  IV  1 g Intravenous Q24H  . ciprofloxacin  500 mg Oral BID  . doxycycline  100 mg Oral Q12H  . feeding supplement (ENSURE COMPLETE)  237 mL Oral BID BM  . fentaNYL  12.5 mcg Transdermal Q72H  . heparin subcutaneous  5,000 Units Subcutaneous Q8H  . LORazepam      . multivitamin with minerals  1 tablet Oral Daily  . nebivolol  10 mg Oral Daily  . sodium hypochlorite   Irrigation BID  . sodium polystyrene  30 g Oral Once  . vancomycin  750 mg Intravenous Q24H  . vitamin C  500 mg Oral BID  . white petrolatum      . zinc sulfate  220 mg Oral Daily    Continuous Infusions: . sodium chloride 40 mL/hr at 11/24/13 [place], [place], CNSC [phone] Pager [phone] After Hours Pager

## 2013-11-26 NOTE — Progress Notes (Signed)
Attempted call to daughter Lanier PrudeSharice to update on mother's status. No answer and no voicemail. Will attempt again.  Yong ChannelAlicia Yarlin Breisch, NP Palliative Medicine Team Team Phone # [phone]

## 2013-11-26 NOTE — Progress Notes (Signed)
Progress Note from the Palliative Medicine Team at [facility]  Subjective: I spoke with Lindsay Browning today via telephone about Lindsay Browning's plan of care. We understand that she is not going to cooperate with [place] and truly limits what we can do to assist her and help her improve at all. We discussed that her wounds will worsen and she will have an infection that will likely take her life - especially since we are unable to appropriately assess and routinely cleanse her wounds. We discussed that we are unable to maintain IV access which limits our ability to sedate her enough for testing. Her treatment plan would be long term. Lindsay Browning understands this and is trying to accept the difficult place her mother is at but wants to make her mother as comfortable and happy as she can. This includes providing 24/7 care for her mother at home. Residential hospice was mentioned to her but Lindsay Browning is concerned that her mother will be just as unhappy there because she only wants to be at home. I did tell her that home is a good option but only if 24/7 care can be provided. We discussed hospice services being available as an option but she is looking into [facility] care so we discussed that Medicare will not pay for both services. We also discussed Lindsay Browning's code status in the context of wounds that we will not be allowed to treat for her and Lindsay Browning wishes to make her a DNR at this time. We agree to proceed with a plan to go home with 24/7 care coverage and comfort care with comfort feeds. We will continue antibiotics by mouth and offer them IF she will take them - per Lindsay Browning's request   Objective: Allergies  Allergen Reactions  . Penicillins Other (See Comments)    Unknown - pt states she's never taken PCN before   Scheduled Meds: . cefTAZidime (FORTAZ)  IV  1 g Intravenous Q24H  . ciprofloxacin  500 mg Oral BID  . doxycycline  100 mg Oral Q12H  . feeding supplement (ENSURE COMPLETE)  237 mL Oral BID BM  . fentaNYL  12.5  mcg Transdermal Q72H  . heparin subcutaneous  5,000 Units Subcutaneous Q8H  . LORazepam      . multivitamin with minerals  1 tablet Oral Daily  . nebivolol  10 mg Oral Daily  . sodium hypochlorite   Irrigation BID  . sodium polystyrene  30 g Oral Once  . vancomycin  750 mg Intravenous Q24H  . vitamin C  500 mg Oral BID  . white petrolatum      . zinc sulfate  220 mg Oral Daily   Continuous Infusions: . sodium chloride 40 mL/hr at 11/24/13 1042   PRN Meds:.morphine injection, oxyCODONE-acetaminophen, senna-docusate  BP 108/78  Pulse 75  Temp(Src) 98.2 F (36.8 C) (Oral)  Resp 18  Ht 5\' 6"  (1.676 m)  Wt 87.544 kg (193 lb)  BMI 31.17 kg/m2  SpO2 100%   PPS: 30%     Intake/Output Summary (Last 24 hours) at 11/26/13 1620 Last data filed at 11/26/13 1200  Gross per 24 hour  Intake    240 ml  Output      0 ml  Net    240 ml      LBM: 11/20/13     Physical Exam:  General:  NAD, resting HEENT:  [place]/AT, no JVD Chest: Breathes symmetric and unlabored, shallow CVS: RRR Abdomen: Soft, ND Neuro: Talkative/paranoid when awake Labs: CBC  Component Value Date/Time   WBC 10.0 11/24/2013 0610   RBC 3.36* 11/24/2013 0610   HGB 10.4* 11/24/2013 0610   HCT 32.0* 11/24/2013 0610   PLT 364 11/24/2013 0610   MCV 95.2 11/24/2013 0610   MCH 31.0 11/24/2013 0610   MCHC 32.5 11/24/2013 0610   RDW 14.1 11/24/2013 0610   LYMPHSABS 1.9 11/21/2013 2153   MONOABS 1.0 11/21/2013 2153   EOSABS 0.0 11/21/2013 2153   BASOSABS 0.0 11/21/2013 2153    BMET    Component Value Date/Time   NA 138 11/26/2013 0905   K 4.7 11/26/2013 0905   CL 104 11/26/2013 0905   CO2 17* 11/26/2013 0905   GLUCOSE 78 11/26/2013 0905   BUN 26* 11/26/2013 0905   CREATININE 1.15* 11/26/2013 0905   CALCIUM 9.2 11/26/2013 0905   GFRNONAA 40* 11/26/2013 0905   GFRAA 46* 11/26/2013 0905    CMP     Component Value Date/Time   NA 138 11/26/2013 0905   K 4.7 11/26/2013 0905   CL 104 11/26/2013 0905   CO2 17* 11/26/2013 0905    GLUCOSE 78 11/26/2013 0905   BUN 26* 11/26/2013 0905   CREATININE 1.15* 11/26/2013 0905   CALCIUM 9.2 11/26/2013 0905   PROT 7.6 11/21/2013 2153   ALBUMIN 2.3* 11/21/2013 2153   AST 22 11/21/2013 2153   ALT 7 11/21/2013 2153   ALKPHOS 89 11/21/2013 2153   BILITOT 0.4 11/21/2013 2153   GFRNONAA 40* 11/26/2013 0905   GFRAA 46* 11/26/2013 0905     Assessment and Plan: 1. Code Status: DNR 2. Symptom Control: 1. Bowel Regimen: Senna S daily.  2. Pain: Percocet prn. Fentanyl duragesic low-dose.  3. Psycho/Social: Emotional support provided to daughter Lindsay PrudeSharice.  4. Disposition: Home with 24/7 caregivers and hopefully hospice care.   Time In Time Out Total Time Spent with Patient Total Overall Time  0400 0430 20min 30min    Greater than 50%  of this time was spent counseling and coordinating care related to the above assessment and plan.  Yong ChannelAlicia Aryaa Bunting, NP Palliative Medicine Team Team Phone # [phone]    1

## 2013-11-26 NOTE — Progress Notes (Addendum)
Pt will not allow nursing to turn her and check her dressings and bottom, says she is not wet.  Did take pain med

## 2013-11-26 NOTE — Plan of Care (Signed)
Problem: Phase III Progression Outcomes Goal: IV/normal saline lock discontinued Outcome: Completed/Met Date Met:  11/26/13 D/C per patient

## 2013-11-26 NOTE — Progress Notes (Signed)
Patient refuse medication and refuse turning q2hours, daughter informed of possible PICC line placement and IV therapy would call for consent.

## 2013-11-26 NOTE — Progress Notes (Signed)
Patient combative and refusing to turn, 4 assist required for bath and dressing change, bath done wound visualized dressing changed on sacrum with dakins solution

## 2013-11-26 NOTE — Progress Notes (Signed)
Pt still refusing dressing changes, SCDs and  All meds x  pain meds ocass.

## 2013-11-26 NOTE — Progress Notes (Signed)
At bedside to place PICC line. Despite IM Ativan pt continues to be agitated and aggressive. Pt refuses to allow us to physically assess or attempt PICC insertion. Due to her combativeness we are unable to perform this sterile procedure safely. Floor RN aware. Ronette DeterJessica Poff, RN

## 2013-11-26 NOTE — Progress Notes (Signed)
PATIENT DETAILS Name: Lindsay Browning Age: 80 y.o. Sex: female Date of Birth: 02-Apr-1919 Admit Date: 11/21/2013 Admitting Physician Haydee Monica, MD ZOX:[id number], Sherilyn Cooter, MD   Brief summary Discharged from [facility] a few weeks back-lives alone-has 2 daughters-[name] Hudson in  [place] ([place] [phone]) and Marlynn Perking who lives in [place] ([place] [phone]). Patient was found by her neighbors, lying in feces and urine. Apparently non-ambulatory due to severe left hip pain. Unfortunately has developed a large decubitus ulcer with eschar. Patient has refused wound care, and has refused blood work as well.  Patient has been seen by psychiatry and does not have capacity. Palliative care medicine has been consulted, and goals of care and disposition plans are being formulated in conjunction with patient's family.   Subjective: Complains that she is very thirsty but can't eat or drink due to interruptions (an untouched meal with 3 beverages is in front of her).    Very talkative.  Assessment/Plan: Principal Problem:  Metabolic acidosis with dehydration  Likely from infected sacral decub.  Currently without IV access.  Eating and drinking minimally.  Patient refuses oral medications (I know better than to take pills on an empty stomach)  Infected sacral decubitus-Unstageable  Difficult situation, Uncooperative will not let us examine properly to assess - due to pain.  Per wound RN, who briefly examined, she has a unstageable sacral decubitus with a lot of necrotic area.   On empiric vancomycin and Fortaz -day 4-but currently no IV access, therefore placed on oral ciprofloxacin and doxycycline till IV access can be placed.  Will add a very low dose fentanyl patch to try to ease her pain.  Cannot pursue further work up-very uncooperative and completely lacks insight.    Bigger question-these wounds will require long term wound care-given patient's  uncooperativeness, it would be almost impossible for this wound to ever heal.  MRI of the pelvic area ordered to assess for left hip pain and also to see if any underlying osteomyelitis given once.  Dr Jerral Ralph had a long d/w patient's daughter Loleta Books in  [place] on 1/17 [facility] [phone])-who requested sedation for diagnostic tests/wound care etc-unfortunately although we can sedate her for MRI hip etc-we will not be able to sedate her every time for a wound dressing changes/hydrotherapy etc. Since this is a long term issue-would need patient cooperation in this matter, if not-then she is going to have worsening wounds, osteo.   Ms. Cwikla completely lacks insight concerning her wound and will not allow treatment.  Palliative care medicine on board to define Goals of care and disposition plans.  Not able to place  IV access a PICC line even after IM Ativan, patient stated and uncooperative.Dr Jerral Ralph explained to patient Daughter Loleta Books, that getting a PICC line under conscious sedation would not change the outcome in this case. Patient's wound will not get better just with antibiotics, she would need debridement and aggressive wound care which she will likely not participate in the long-term. Daughter was very understanding. She will discuss with her sister and let us know later this afternoon about transitioning to comfort care.  Bursitis left hip - Consulted Dr. Magnus Ivan orthopedics to evaluate if patient is a candidate for  intra-articular steroid injection.However patient was uncooperative with him as well. I will attempt an MRI of the pelvis with mild sedation with as needed Ativan. - Apparently from the history obtained, patient was discharged recently from [facility], has been complaining of persistent  left hip pain and as a result she has been nonambulatory. She now presents to our hospital with infected decubitus ulcer, apparently her neighbors found her  lying covered in urine and feces. - Uncooperative to get an MRI as well.  Suspected chronic kidney disease stage III - Unknown baseline. - Have asked RN to get medical records from [facility], however patient refused to sign a release of medical records form. - For now monitor electrolytes periodically, check a UA if patient will give [place] a sample  Hyperkalemia - resolved. With kayexalate.  Hypertension - Continue with Bystolic  Social issues - Apparently discharged from [facility] a few weeks back-lives alone-has 2 daughters-[name] [place] in  [place] ([place] [phone]) and Marlynn Perking who lives in [place] ([place] [phone]). Patient was found by her neighbors, lying in feces and urine. Apparently non-ambulatory due to severe left hip pain. -See by psychiatry -patient has NO capacity.  Disposition: Remain inpatient.   Dr Jerral Ralph- spoke with patient's daughter she reached home today, given continued refusal by patient to participate in wound care, not eating well, development of metabolic acidosis and a very poor overall condition- Recommended that patient be transitioned to comfort measures. Does not look like the patient will be able to participate in long term wound care. Since his nonambulatory, not cooperative to wound care-wounds will only continue to get worse. She also exhibits features of failure to thrive with poor appetite. Suspect she would do best with comfort care status. Family will decide, palliative care will follow later on today.   DVT Prophylaxis: Prophylactic Heparin  Code Status: Full code   Family Communication Called Loleta Books - no answer. She lives in  [place] ([place] [phone]).   Procedures:  None  CONSULTS:  psychiatry and orthopedic surgery Palliative Medicine.  Time spent 30 min.  MEDICATIONS: Scheduled Meds: . cefTAZidime (FORTAZ)  IV  1 g Intravenous Q24H  . ciprofloxacin  500 mg Oral BID  .  doxycycline  100 mg Oral Q12H  . feeding supplement (ENSURE COMPLETE)  237 mL Oral BID BM  . fentaNYL  12.5 mcg Transdermal Q72H  . heparin subcutaneous  5,000 Units Subcutaneous Q8H  . multivitamin with minerals  1 tablet Oral Daily  . nebivolol  10 mg Oral Daily  . sodium hypochlorite   Irrigation BID  . sodium polystyrene  30 g Oral Once  . vancomycin  750 mg Intravenous Q24H  . vitamin C  500 mg Oral BID  . zinc sulfate  220 mg Oral Daily   Continuous Infusions: . sodium chloride 40 mL/hr at 11/24/13 1042   PRN Meds:.morphine injection, oxyCODONE-acetaminophen, senna-docusate  Antibiotics: Anti-infectives   Start     Dose/Rate Route Frequency Ordered Stop   11/25/13 2000  ciprofloxacin (CIPRO) tablet 500 mg    Comments:  Pharmacy to adjust if needed for renal function   500 mg Oral 2 times daily 11/25/13 1431     11/25/13 1500  doxycycline (VIBRA-TABS) tablet 100 mg     100 mg Oral Every 12 hours 11/25/13 1431     11/25/13 1300  cefTAZidime (FORTAZ) 1 g in dextrose 5 % 50 mL IVPB     1 g 100 mL/hr over 30 Minutes Intravenous Every 24 hours 11/25/13 1457     11/23/13 1300  vancomycin (VANCOCIN) IVPB 750 mg/150 ml premix     750 mg 150 mL/hr over 60 Minutes Intravenous Every 24 hours 11/22/13 1210     11/22/13 1300  cefTAZidime (FORTAZ) 1 g in dextrose 5 % 50 mL IVPB  Status:  Discontinued     1 g 100 mL/hr over 30 Minutes Intravenous Every 24 hours 11/22/13 1214 11/25/13 1451   11/22/13 1230  vancomycin (VANCOCIN) 1,250 mg in sodium chloride 0.9 % 250 mL IVPB     1,250 mg 166.7 mL/hr over 90 Minutes Intravenous NOW 11/22/13 1210 11/22/13 1408       PHYSICAL EXAM: Vital signs in last 24 hours: Filed Vitals:   11/25/13 1537 11/25/13 2115 11/25/13 2321 11/26/13 0624  BP: 109/40 144/48 106/76 108/78  Pulse: 69 67 70 75  Temp: 98.4 F (36.9 C) 98.7 F (37.1 C) 98.1 F (36.7 C) 98.2 F (36.8 C)  TempSrc:      Resp: 18 18 16 18   Height:      Weight:      SpO2: 100%  100% 100% 100%    Weight change:  Filed Weights   11/22/13 0900  Weight: 87.544 kg (193 lb)   Body mass index is 31.17 kg/(m^2).   Gen Exam: Awake and mostly alert with clear speech.  Very talkative.   Neck: Supple, No JVD.   Chest: B/L Clear.  Patient does not take a deep breath. CVS: decreased heart sounds, no murmurs.  Abdomen: soft, BS +, non tender, non distended.  Extremities: no edema, lower extremities warm to touch. Able to move all 4, but each VERY TENDER to palpation. Psych:  Patient completely lacks insight, very independent spirit.  Believes she has been harmed repeatedly by following medical advice.  Trusts in taking herbal supplements. Skin:  Peeling, appears very dry.  Patient reports she is in too much pain for me to exam her sacral wound.   Intake/Output from previous day:  Intake/Output Summary (Last 24 hours) at 11/26/13 1042 Last data filed at 11/25/13 1829  Gross per 24 hour  Intake    120 ml  Output      0 ml  Net    120 ml     LAB RESULTS: CBC  Recent Labs Lab 11/21/13 2153 11/23/13 0416 11/24/13 0610  WBC 16.9* 13.3* 10.0  HGB 12.9 10.3* 10.4*  HCT 38.6 30.7* 32.0*  PLT 421* 276 364  MCV 93.9 92.5 95.2  MCH 31.4 31.0 31.0  MCHC 33.4 33.6 32.5  RDW 13.4 14.3 14.1  LYMPHSABS 1.9  --   --   MONOABS 1.0  --   --   EOSABS 0.0  --   --   BASOSABS 0.0  --   --     Chemistries   Recent Labs Lab 11/21/13 2153 11/22/13 0432 11/24/13 0610 11/25/13 0525 11/26/13 0905  NA 139 138 139 138 138  K 5.8* 5.4* 4.8 5.2 4.7  CL 101 103 105 106 104  CO2 21 17* 16* 13* 17*  GLUCOSE 127* 114* 52* 78 78  BUN 44* 46* 36* 31* 26*  CREATININE 1.50* 1.68* 1.23* 1.14* 1.15*  CALCIUM 10.3 9.9 8.9 9.2 9.2     Cardiac Enzymes  Recent Labs Lab 11/21/13 2153  TROPONINI <0.30     RADIOLOGY STUDIES/RESULTS: Dg Chest 2 View  11/22/2013   CLINICAL DATA:  Skin ulcer.  EXAM: CHEST  2 VIEW  COMPARISON:  None currently available  FINDINGS: No  cardiomegaly. No acute infiltrate or edema. No effusion or pneumothorax. No acute osseous findings.  IMPRESSION: No active cardiopulmonary disease.   Electronically Signed   By: Tiburcio PeaJonathan  Watts M.D.   On: 11/22/2013 00:03  Dg Hip Complete Left  11/22/2013   CLINICAL DATA:  Skin ulcer  EXAM: LEFT HIP - COMPLETE 2+ VIEW  COMPARISON:  09/27/2013  FINDINGS: No evidence of acute fracture or dislocation involving the hips. The pelvic ring is intact. There is advanced lower lumbar facet degeneration. Calcified uterine fibroid noted. Punctate high-density material is chronically associated with the left gluteus musculature near the greater trochanter.  IMPRESSION: No acute osseous findings.   Electronically Signed   By: Tiburcio Pea M.D.   On: 11/22/2013 00:40    [facility] [place] [facility] Pager:[phone]  If 7PM-7AM, please contact night-coverage [email or web address] Password [email or web address] 11/26/2013, 10:42 AM   LOS: 5 days   Attending Patient seen and examined, agree with the above assessment and plan. Difficult situation, unable to place IV access even with the PICC line given patient uncooperativeness. I have spoken at length with patient's daughter over the phone, explained that placing a PICC line under conscious sedation would expose the patient to a lot of adverse effects. More importantly, placing a PICC line probably would not change the overall outcome in this case as patient continues to refuse wound care and debridement. Given patient's lack of capacity to understand the importance of wound care, more likely than not she would continue to deteriorate even with antibiotics. She not is also refusing oral medications, has a very poor oral intake. Given the fact that she is failing to thrive, is 80 years old, lacks capacity and is unwilling to participate in the wound care, I see no other option then to transition to comfort care. The family will talk among themselves, palliative care we'll  follow. Have recommended that patient be made a DO NOT RESUSCITATE as well. Suspect will do best with a residential hospice on discharge   S Ghimire

## 2013-11-27 ENCOUNTER — Encounter: Payer: Self-pay | Admitting: Family Medicine

## 2013-11-27 DIAGNOSIS — L8990 Pressure ulcer of unspecified site, unspecified stage: Secondary | ICD-10-CM

## 2013-11-27 MED ORDER — DAKINS (1/4 STRENGTH) 0.125 % EX SOLN: Freq: Two times a day (BID) | CUTANEOUS | Status: DC

## 2013-11-27 MED ORDER — ZINC SULFATE 220 (50 ZN) MG PO CAPS: 220.0000 mg | ORAL_CAPSULE | Freq: Every day | ORAL | Status: DC

## 2013-11-27 MED ORDER — ADULT MULTIVITAMIN W/MINERALS CH: 1.0000 | ORAL_TABLET | Freq: Every day | ORAL | Status: DC

## 2013-11-27 MED ORDER — OXYCODONE-ACETAMINOPHEN 5-325 MG PO TABS: 1.0000 | ORAL_TABLET | Freq: Four times a day (QID) | ORAL | Status: DC | PRN

## 2013-11-27 MED ORDER — ENSURE COMPLETE PO LIQD: 237.0000 mL | Freq: Two times a day (BID) | ORAL | Status: DC

## 2013-11-27 MED ORDER — ASCORBIC ACID 500 MG PO TABS: 500.0000 mg | ORAL_TABLET | Freq: Two times a day (BID) | ORAL | Status: DC

## 2013-11-27 MED ORDER — DOXYCYCLINE HYCLATE 100 MG PO TABS: 100.0000 mg | ORAL_TABLET | Freq: Two times a day (BID) | ORAL | Status: DC

## 2013-11-27 MED ORDER — SENNOSIDES-DOCUSATE SODIUM 8.6-50 MG PO TABS: 2.0000 | ORAL_TABLET | Freq: Every day | ORAL | Status: DC

## 2013-11-27 NOTE — Care Management Note (Signed)
11/26/13 11:00am Jeremie Abdelaziz, RN BSN Case Manager Case Manager has spoken with patient's daughter [name] Horne- [phone] at length on 1/20 and 11/27/13. Case Manager explained that Ms. Raybon will require 24/7 care at home. Went into great detail as to need for care and patient's condition. [name] stated that she would not send her mom to a SNF,and she cant come to  to handle care but will do what she can to arrange for care. [name] states that the neighbor next door-Darnelle- has a key and will let persons in and out. 11/27/13 Case Manager received a conference call from [name] Horne and her sister Sherry. Questions were asked about Case Manager arranging for CAPS worker and arranging for supplies for home. CM attempted for almost an hour to explain services that we can arrange and what the extent of their mother's needs are.Questions addressed concerning 24/7 care. [name] states that neighbor Darnelle works till 4pm every day but will be available in the evening to accept bed. CM again stated that patient requires someone in attendance at all times. Was informed that "neighbors down the street" will be checking on her. CM stressed to both daughters seriousness of patient being left alone. [name] stated she would contact Case Manager in the AM to let her know of arrangements . Case Manager contacted Mary Hickling,RN liasion with the referral. Patient will need hospital bed with overlay.  

## 2013-11-27 NOTE — Consult Note (Signed)
I have reviewed and discussed the care of this patient in detail with the nurse practitioner including pertinent patient records, physical exam findings and data. I agree with details of this encounter.  

## 2013-11-27 NOTE — Progress Notes (Signed)
Name of Ethics Committee member: Maryclare LabradorGS Lindsay Windle, MD Date/Time: [phone] on 11/27/13  Name of person making initial contact: Poonum Ambelao Relationship/Role in patient's care: SW Contact information (specify phone, pager, etc): [phone]  Name, location of patient and MR#: Lindsay Browning, 5N25, [place] [id number] Has palliative care consult been obtained/requested? Yes  Information per chart and SW.   Brief description of clinical/ethical question: discharge/placement location  80 yo female with bilateral buttock decubitus ulcers. She was found by neighbors lying in urine/feces and was bedridden due to severe left hip pain. She has been seen by psychiatry and does not have capacity for decision making. She is insistent about going home.  Family would like to take patient home.  Per report, home situation was not safe for patient.  Reportedly, family has worked to improve home situation in the interval.  Family is reportedly aware of need for 24 hour care.    Disposition after initial phone contact:  Given that family reports improvement in the home situation and the plan is for palliative care, it would be reasonable to attempt to verify the safety of the home situation.  Should the home be a viable option for care, then it would be reasonable to proceed with potential discharge per medical team.  Should the home not be viable location, then discussion would need to take place with family about disposition.  If the family still refuses other options at that point and insists on discharge to unsafe home, then ethics may be of benefit with family discussion. Please call back as needed.  We appreciate this consult.

## 2013-11-27 NOTE — Progress Notes (Signed)
I spoke with Lindsay PrudeSharice, Lindsay Browning's daughter, and highly recommended that we look into residential hospice placement for her mother. I voiced my high concern for her safety at home and reinforced that she would NEED a capable body there in the home every minute, every day, 7 days a week and this was not an option but necessity for being at home. Lindsay Browning is having a hard time understanding that we cannot arrange 24/7 caregivers for her but that she has to make those arrangements. Again, I suggested for her to allow [place] to look into residential hospice as a temporary measure. I also reinforced that her mother's wounds will worsen and will cause a further decline, likely very soon, that will hasten her death into the near future. She says she understands. I suggest that I would only be okay with her going home if there are appropriate caregivers 24/7 and if hospice could follow to transition into residential hospice when needed. Lindsay Browning verbalizes understanding. I emailed her hospice information as requested (phone number). I will continue to offer to look into placement for residential hospice. Will follow up tomorrow.   Lindsay ChannelAlicia Refugio Vandevoorde, NP Palliative Medicine Team Team Phone # [phone]

## 2013-11-27 NOTE — Progress Notes (Addendum)
CSW (Clinical Child psychotherapistocial Worker) called pt daughter to discuss dc plan and possibly provide options for SNF. CSW unable to reach pt daughter or leave message. Will continue to try.  Addendum 12:20pm: CSW received call back from pt daughter. Pt daughter not happy to be speaking with CSW as she stated "you are the 6th person I have talked to about this. Do you all not communicate?" CSW explained role and purpose for phone call. Pt daughter stated she "absolutely does not want that" in reference to skilled facility. CSW explained that pt is ready for dc today and medical team is recommending SNF. If pt family would like pt to dc home, then 24 hr care needs to be in place for today. Pt daughter continued to become frustrated with CSW. CSW offered support and understanding. Pt daughter stated that she is unable to set up everything as she is not a doctor. CSW explained that personal care services is something family would need to set up. Pt daughter continued to state that "Medicaid needs doctors to do that".  CSW explained that pt family would need to go through DSS to get Medicaid personal aide but even if this was done that would not be an available service for 24 hours of care. Pt daughter became angry with CSW and no longer wanting to speak. Pt daughter mentioned speaking with someone yesterday in length about "this stuff". CSW asked if she would be willing to speak with that person (assumed RN CM) again today to make final decision. Pt daughter was agreeable to this. At this time, pt daughter is adamantly refusing for pt to go to SNF and does not want CSW services.  Halston Fairclough, LCSWA [phone]

## 2013-11-27 NOTE — Progress Notes (Signed)
I have reviewed and discussed the care of this patient in detail with the nurse practitioner including pertinent patient records, physical exam findings and data. I agree with details of this encounter.  

## 2013-11-27 NOTE — Progress Notes (Signed)
[facility] PROGRESS NOTE  Lindsay Browning GEX:[id number] DOB: 1919-07-30 DOA: 11/21/2013 PCP: Florentina Jenny, MD  Assessment/Plan: Discharged from [facility] a few weeks back-lives alone-has 2 daughters-Lindsay Browning in [place] ([place] [phone]) and Lindsay Browning who lives in [place] ([place] [phone]). Patient was found by her neighbors, lying in feces and urine. Apparently non-ambulatory due to severe left hip pain. Unfortunately has developed a large decubitus ulcer with eschar. Patient has refused wound care, and has refused blood work as well. Patient has been seen by psychiatry and does not have capacity. Patient is unsafe at home due to lack of care;   -Palliative care medicine has been consulted, and goals of care and disposition plans are being formulated in conjunction with patient's family. Patient is DNR; patient was evaluated by orthopedics; patient is refusing oral medications, has a very poor oral intake. Given the fact that she is failing to thrive, is 80 years old, lacks capacity and is unwilling to participate in the wound care  -per palliative care: comfort care with comfort feeds. cont current regimen, wound care, supportive care; prognosis is poor; Family declined residential hospice at this time; hopefully will consider that in near future;   Called her daughter Lindsay Browning Daughter [phone] she is refusing SNF; case will be reported to ethics committee  -hold d/c   Assessment/Plan:  Principal Problem:  Metabolic acidosis with dehydration  Likely from infected sacral decub.  Currently without IV access. Eating and drinking minimally.  Patient refuses oral medications (I know better than to take pills on an empty stomach) Infected sacral decubitus-Unstageable  Difficult situation, Uncooperative will not let us examine properly to assess - due to pain.  Per wound RN, who briefly examined, she has a unstageable sacral decubitus with a lot of  necrotic area.  On empiric vancomycin and Fortaz -day 4-but currently no IV access, therefore placed on oral ciprofloxacin and doxycycline till IV access can be placed.  Will add a very low dose fentanyl patch to try to ease her pain.  Cannot pursue further work up-very uncooperative and completely lacks insight.  Bigger question-these wounds will require long term wound care-given patient's uncooperativeness, it would be almost impossible for this wound to ever heal.  MRI of the pelvic area ordered to assess for left hip pain and also to see if any underlying osteomyelitis given once.  Dr Jerral Ralph had a long d/w patient's daughter Lindsay Browning in [place] on 1/17 [facility] [phone])-who requested sedation for diagnostic tests/wound care etc-unfortunately although we can sedate her for MRI hip etc-we will not be able to sedate her every time for a wound dressing changes/hydrotherapy etc. Since this is a long term issue-would need patient cooperation in this matter, if not-then she is going to have worsening wounds, osteo.  Ms. Treese completely lacks insight concerning her wound and will not allow treatment.  Palliative care medicine on board to define Goals of care and disposition plans. Not able to place IV access a PICC line even after IM Ativan, patient stated and uncooperative.Dr Jerral Ralph explained to patient Daughter Lindsay Browning, that getting a PICC line under conscious sedation would not change the outcome in this case. Patient's wound will not get better just with antibiotics, she would need debridement and aggressive wound care which she will likely not participate in the long-term. Daughter was very understanding. She will discuss with her sister and let us know later this afternoon about transitioning to comfort care.  Bursitis left hip  - Consulted Dr.  Blackman orthopedics to evaluate if patient is a candidate for intra-articular steroid injection.However patient was uncooperative with him as  well. I will attempt an MRI of the pelvis with mild sedation with as needed Ativan.  - Apparently from the history obtained, patient was [facility], has been complaining of persistent left hip pain and as a result she has been nonambulatory. She now presents to our hospital with infected decubitus ulcer, apparently her neighbors found her lying covered in urine and feces.  - Uncooperative to get an MRI as well.  Suspected chronic kidney disease stage III  - Unknown baseline.  - Have asked RN to get medical records [facility], however patient refused to sign a release of medical records form.  - For now monitor electrolytes periodically, check a UA if patient will give us a sample  Hyperkalemia  - resolved. With kayexalate.  Hypertension  - Continue with Bystolic  Social issues  - Apparently discharged [facility] back-lives alone-has 2 daughters-Lindsay [place]orne in [place] ([place] [phone]) and Lindsay PerkingSharie who lives in [place] ([place] [phone]). Patient was found by her neighbors, lying in feces and urine. Apparently non-ambulatory due to severe left hip pain.  -See by psychiatry -patient has NO capacity.    Code Status: DNR Family Communication: d/w her daughter, patient (indicate person spoken with, relationship, and if by phone, the number) Disposition Plan: SNF   Consultants:  None   Procedures:  None   Antibiotics:  Cirp, doxycyline  (indicate start date, and stop date if known)  HPI/Subjective: aler  Objective: Filed Vitals:   11/27/13 1300  BP: 130/68  Pulse: 62  Temp: 98 F (36.7 C)  Resp: 17    Intake/Output Summary (Last 24 hours) at 11/27/13 1600 Last data filed at 11/27/13 1300  Gross per 24 hour  Intake    330 ml  Output      0 ml  Net    330 ml   Filed Weights   11/22/13 0900  Weight: 87.544 kg (193 lb)    Exam:   General:   alert  Cardiovascular: s1,s2 rrr  Respiratory: few LL crackes   Abdomen: soft, nt, nd  Musculoskeletal: mild edema   Data Reviewed: Basic Metabolic Panel:  Recent Labs Lab 11/21/13 2153 11/22/13 0432 11/24/13 0610 11/25/13 0525 11/26/13 0905  NA 139 138 139 138 138  K 5.8* 5.4* 4.8 5.2 4.7  CL 101 103 105 106 104  CO2 21 17* 16* 13* 17*  GLUCOSE 127* 114* 52* 78 78  BUN 44* 46* 36* 31* 26*  CREATININE 1.50* 1.68* 1.23* 1.14* 1.15*  CALCIUM 10.3 9.9 8.9 9.2 9.2   Liver Function Tests:  Recent Labs Lab 11/21/13 2153  AST 22  ALT 7  ALKPHOS 89  BILITOT 0.4  PROT 7.6  ALBUMIN 2.3*   No results found for this basename: LIPASE, AMYLASE,  in the last 168 hours No results found for this basename: AMMONIA,  in the last 168 hours CBC:  Recent Labs Lab 11/21/13 2153 11/23/13 0416 11/24/13 0610  WBC 16.9* 13.3* 10.0  NEUTROABS 14.0*  --   --   HGB 12.9 10.3* 10.4*  HCT 38.6 30.7* 32.0*  MCV 93.9 92.5 95.2  PLT 421* 276 364   Cardiac Enzymes:  Recent Labs Lab 11/21/13 2153 11/26/13 0905  CKTOTAL 60 67  TROPONINI <0.30  --    BNP (last 3 results) No results found  for this basename: PROBNP,  in the last 8760 hours CBG: No results found for this basename: GLUCAP,  in the last 168 hours  No results found for this or any previous visit (from the past 240 hour(s)).   Studies: No results found.  Scheduled Meds: . ciprofloxacin  500 mg Oral BID  . doxycycline  100 mg Oral Q12H  . feeding supplement (ENSURE COMPLETE)  237 mL Oral BID BM  . fentaNYL  12.5 mcg Transdermal Q72H  . heparin subcutaneous  5,000 Units Subcutaneous Q8H  . multivitamin with minerals  1 tablet Oral Daily  . nebivolol  10 mg Oral Daily  . senna-docusate  2 tablet Oral QHS  . sodium hypochlorite   Irrigation BID  . sodium polystyrene  30 g Oral Once  . vitamin C  500 mg Oral BID  . zinc sulfate  220 mg Oral Daily   Continuous Infusions: . sodium chloride 40 mL/hr at 11/24/13  1042    Principal Problem:   Sacral decubitus ulcer, stage III Active Problems:   Hypertension   Renal disorder   Infected wound   Palliative care encounter    Time spent: >35 minutes    Esperanza Sheets  [facility] Pager [phone]. If 7PM-7AM, please contact night-coverage at [email or web address], password [facility] 11/27/2013, 4:00 PM  LOS: 6 days

## 2013-11-27 NOTE — Discharge Summary (Deleted)
Physician Discharge Summary  Elwyn Ladesabell C Mustard ZOX:[id number] DOB: 10-02-1919 DOA: 11/21/2013  PCP: Florentina JennyRIPP, HENRY, MD  Admit date: 11/21/2013 Discharge date: 11/27/2013  Time spent: >35 minutes  Recommendations for Outpatient Follow-up:  Palliative care follow up  Discharge Diagnoses:  Principal Problem:   Sacral decubitus ulcer, stage III Active Problems:   Hypertension   Renal disorder   Infected wound   Palliative care encounter   Discharge Condition: stable, but poor prognosis   Diet recommendation: heart healthy   Filed Weights   11/22/13 0900  Weight: 87.544 kg (193 lb)    History of present illness/Hospital Course:  Discharged from [facility] a [facility] back-lives alone-has 2 daughters-[name] [place]ach[place]Horne in [place] (Tel [phone]) and Marlynn PerkingSharie who lives in [place] [phone] [phone]). Patient was found by her neighbors, lying in feces and urine. Apparently non-ambulatory due to severe left hip pain. Unfortunately has developed a large decubitus ulcer with eschar. Patient has refused wound care, and has refused blood work as well. Patient has been seen by psychiatry and does not have capacity. Patient is unsafe at home due to lack of care;   -Palliative care medicine has been consulted, and goals of care and disposition plans are being formulated in conjunction with patient's family. Patient is DNR; patient was evaluated by orthopedics; patient is refusing oral medications, has a very poor oral intake. Given the fact that she is failing to thrive, is 80 years old, lacks capacity and is unwilling to participate in the wound care  -per palliative care: comfort care with comfort feeds. cont current regimen, wound care, supportive care; prognosis is poor; Family declined residential hospice at this time; hopefully will consider that in near future;     Procedures:  None  (i.e. Studies not automatically included, echos, thoracentesis, etc; not  x-rays)  Consultations:  Orthopedics, psychiatry  Discharge Exam: Filed Vitals:   11/27/13 0506  BP: 111/47  Pulse: 71  Temp: 97.9 F (36.6 C)  Resp: 16    General: confused Cardiovascular: s1,s2 rrr Respiratory: CTA BL  Discharge Instructions  Discharge Orders   Future Orders Complete By Expires   Diet - low sodium heart healthy  As directed    Increase activity slowly  As directed        Medication List         ascorbic acid 500 MG tablet  Commonly known as:  VITAMIN C  Take 1 tablet (500 mg total) by mouth 2 (two) times daily.     doxycycline 100 MG tablet  Commonly known as:  VIBRA-TABS  Take 1 tablet (100 mg total) by mouth every 12 (twelve) hours.     feeding supplement (ENSURE COMPLETE) Liqd  Take 237 mLs by mouth 2 (two) times daily between meals.     multivitamin with minerals Tabs tablet  Take 1 tablet by mouth daily.     nebivolol 10 MG tablet  Commonly known as:  BYSTOLIC  Take 10 mg by mouth daily.     oxyCODONE-acetaminophen 5-325 MG per tablet  Commonly known as:  PERCOCET/ROXICET  Take 1-2 tablets by mouth every 6 (six) hours as needed for moderate pain or severe pain.     senna-docusate 8.6-50 MG per tablet  Commonly known as:  Senokot-S  Take 2 tablets by mouth at bedtime.     sodium hypochlorite 0.125 % Soln  Commonly known as:  DAKIN'S 1/4 STRENGTH  Irrigate with as directed 2 (two) times daily.     zinc sulfate  220 MG capsule  Take 1 capsule (220 mg total) by mouth daily.       Allergies  Allergen Reactions  . Penicillins Other (See Comments)    Unknown - pt states she's never taken PCN before       Follow-up Information   Follow up with Florentina Jenny, MD.   Specialty:  Family Medicine   Contact information:   [phone] [place] [address] [phone]       Follow up with Florentina Jenny, MD In 1 week.   Specialty:  Family Medicine   Contact information:   [address] [place] [address] [phone]        The results of significant diagnostics from this hospitalization (including imaging, microbiology, ancillary and laboratory) are listed below for reference.    Significant Diagnostic Studies: Dg Chest 2 View  11/22/2013   CLINICAL DATA:  Skin ulcer.  EXAM: CHEST  2 VIEW  COMPARISON:  None currently available  FINDINGS: No cardiomegaly. No acute infiltrate or edema. No effusion or pneumothorax. No acute osseous findings.  IMPRESSION: No active cardiopulmonary disease.   Electronically Signed   By: Tiburcio Pea M.D.   On: 11/22/2013 00:03   Dg Hip Complete Left  11/22/2013   CLINICAL DATA:  Skin ulcer  EXAM: LEFT HIP - COMPLETE 2+ VIEW  COMPARISON:  09/27/2013  FINDINGS: No evidence of acute fracture or dislocation involving the hips. The pelvic ring is intact. There is advanced lower lumbar facet degeneration. Calcified uterine fibroid noted. Punctate high-density material is chronically associated with the left gluteus musculature near the greater trochanter.  IMPRESSION: No acute osseous findings.   Electronically Signed   By: Tiburcio Pea M.D.   On: 11/22/2013 00:40    Microbiology: No results found for this or any previous visit (from the past 240 hour(s)).   Labs: Basic Metabolic Panel:  Recent Labs Lab 11/21/13 2153 11/22/13 0432 11/24/13 0610 11/25/13 0525 11/26/13 0905  NA 139 138 139 138 138  K 5.8* 5.4* 4.8 5.2 4.7  CL 101 103 105 106 104  CO2 21 17* 16* 13* 17*  GLUCOSE 127* 114* 52* 78 78  BUN 44* 46* 36* 31* 26*  CREATININE 1.50* 1.68* 1.23* 1.14* 1.15*  CALCIUM 10.3 9.9 8.9 9.2 9.2   Liver Function Tests:  Recent Labs Lab 11/21/13 2153  AST 22  ALT 7  ALKPHOS 89  BILITOT 0.4  PROT 7.6  ALBUMIN 2.3*   No results found for this basename: LIPASE, AMYLASE,  in the last 168 hours No results found for this basename: AMMONIA,  in the last 168 hours CBC:  Recent Labs Lab 11/21/13 2153 11/23/13 0416 11/24/13 0610   WBC 16.9* 13.3* 10.0  NEUTROABS 14.0*  --   --   HGB 12.9 10.3* 10.4*  HCT 38.6 30.7* 32.0*  MCV 93.9 92.5 95.2  PLT 421* 276 364   Cardiac Enzymes:  Recent Labs Lab 11/21/13 2153 11/26/13 0905  CKTOTAL 60 67  TROPONINI <0.30  --    BNP: BNP (last 3 results) No results found for this basename: PROBNP,  in the last 8760 hours CBG: No results found for this basename: GLUCAP,  in the last 168 hours     Signed:  Jonette Mate N  [facility] 11/27/2013, 11:04 AM
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
## 2013-11-28 ENCOUNTER — Encounter: Payer: Self-pay | Admitting: Family Medicine
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
## 2013-11-28 NOTE — Progress Notes (Signed)
I am shadowing chart. Please contact for any further palliative needs.  Yong ChannelAlicia Gracious Renken, NP Palliative Medicine Team Team Phone # [phone]

## 2013-11-28 NOTE — Care Management Note (Signed)
11/28/13 3:00p  Vance PeperSusan Judah Carchi, RN BSN Case Manager received call from Jodene NamMary Hickling, RN [facility]. She states that [facility] will not be accepting patient for home care. Case Financial controllerManager notified Social Worker.
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
## 2013-11-28 NOTE — Progress Notes (Signed)
Patient refuses mattress overlay.

## 2013-11-28 NOTE — Progress Notes (Signed)
[facility] PROGRESS NOTE  Lindsay Browning NFA:[id number] DOB: Jul 16, 1919 DOA: 11/21/2013 PCP: Florentina Jenny, MD  Assessment/Plan: Discharged from [facility] back-lives alone-has 2 daughters-Lindsay Destrehan in [place] ([place] [phone]) and Lindsay Browning who lives in [place] ([place] [phone]). Patient was found by her neighbors, lying in feces and urine. Apparently non-ambulatory due to severe left hip pain. Unfortunately has developed a large decubitus ulcer with eschar. Patient has refused wound care, and has refused blood work as well. Patient has been seen by psychiatry and does not have capacity. Patient is unsafe at home due to lack of care;   -Palliative care medicine has been consulted, and goals of care and disposition plans are being formulated in conjunction with patient's family. Patient is DNR; patient was evaluated by orthopedics; patient is refusing oral medications, has a very poor oral intake. Given the fact that she is failing to thrive, is 80 years old, lacks capacity and is unwilling to participate in the wound care  -per palliative care: comfort care with comfort feeds. -Will cont current regimen, wound care, supportive care; prognosis is poor; Family declined residential hospice at this time; hopefully will consider that in near future;   Called her daughter Lindsay Browning Daughter [phone] she is refusing SNF; case will be reported to ethics committee  -hold d/c; pend final decision, home inspection, ethics recommendation   Assessment/Plan:  Principal Problem:  Metabolic acidosis with dehydration  Likely from infected sacral decub.  Currently without IV access. Eating and drinking minimally.  Patient refuses oral medications (I know better than to take pills on an empty stomach) Infected sacral decubitus-Unstageable  Difficult situation, Uncooperative will not let us examine properly to assess - due to pain.  Per wound RN, who briefly  examined, she has a unstageable sacral decubitus with a lot of necrotic area.  On empiric vancomycin and Fortaz -day 4-but currently no IV access, therefore placed on oral ciprofloxacin and doxycycline till IV access can be placed.  Will add a very low dose fentanyl patch to try to ease her pain.  Cannot pursue further work up-very uncooperative and completely lacks insight.  Bigger question-these wounds will require long term wound care-given patient's uncooperativeness, it would be almost impossible for this wound to ever heal.  MRI of the pelvic area ordered to assess for left hip pain and also to see if any underlying osteomyelitis given once.  Dr Jerral Ralph had a long d/w patient's daughter Lindsay Browning in [place] on 1/17 [facility] [phone])-who requested sedation for diagnostic tests/wound care etc-unfortunately although we can sedate her for MRI hip etc-we will not be able to sedate her every time for a wound dressing changes/hydrotherapy etc. Since this is a long term issue-would need patient cooperation in this matter, if not-then she is going to have worsening wounds, osteo.  Lindsay Browning completely lacks insight concerning her wound and will not allow treatment.  Palliative care medicine on board to define Goals of care and disposition plans. Not able to place IV access a PICC line even after IM Ativan, patient stated and uncooperative.Dr Jerral Ralph explained to patient Daughter Lindsay Browning, that getting a PICC line under conscious sedation would not change the outcome in this case. Patient's wound will not get better just with antibiotics, she would need debridement and aggressive wound care which she will likely not participate in the long-term. Daughter was very understanding. She will discuss with her sister and let us know later this afternoon about transitioning to comfort care.  Bursitis left hip  - Consulted Dr. Magnus IvanBlackman orthopedics to evaluate if patient is a candidate for intra-articular  steroid injection.However patient was uncooperative with him as well. I will attempt an MRI of the pelvis with mild sedation with as needed Ativan.  - Apparently from the history obtained, patient was [facility], has been complaining of persistent left hip pain and as a result she has been nonambulatory. She now presents to our hospital with infected decubitus ulcer, apparently her neighbors found her lying covered in urine and feces.  - Uncooperative to get an MRI as well.  Suspected chronic kidney disease stage III  - Unknown baseline.  - Have asked RN to get medical records [facility], however patient refused to sign a release of medical records form.  - For now monitor electrolytes periodically, check a UA if patient will give us a sample  Hyperkalemia  - resolved. With kayexalate.  Hypertension  - Continue with Bystolic  Social issues  - Apparently discharged from [facility] a few [facility] back-lives alone-has 2 daughters-[place] in [place] [phone]) and Lindsay [place]kingSharie who lives in [place] ([place] [phone]). Patient was found by her neighbors, lying in feces and urine. Apparently non-ambulatory due to severe left hip pain.  -See by psychiatry -patient has NO capacity.    Code Status: DNR Family Communication: d/w her daughter, patient (indicate person spoken with, relationship, and if by phone, the number) Disposition Plan: SNF   Consultants:  None   Procedures:  None   Antibiotics:  Cirp, doxycyline  (indicate start date, and stop date if known)  HPI/Subjective: aler  Objective: Filed Vitals:   11/28/13 0438  BP: 124/41  Pulse: 75  Temp: 98.1 F (36.7 C)  Resp: 17    Intake/Output Summary (Last 24 hours) at 11/28/13 1029 Last data filed at 11/28/13 0544  Gross per 24 hour  Intake    220 ml  Output    650 ml  Net   -430 ml   Filed Weights   11/22/13 0900  Weight:  87.544 kg (193 lb)    Exam:   General:  alert  Cardiovascular: s1,s2 rrr  Respiratory: few LL crackes   Abdomen: soft, nt, nd  Musculoskeletal: mild edema   Data Reviewed: Basic Metabolic Panel:  Recent Labs Lab 11/21/13 2153 11/22/13 0432 11/24/13 0610 11/25/13 0525 11/26/13 0905  NA 139 138 139 138 138  K 5.8* 5.4* 4.8 5.2 4.7  CL 101 103 105 106 104  CO2 21 17* 16* 13* 17*  GLUCOSE 127* 114* 52* 78 78  BUN 44* 46* 36* 31* 26*  CREATININE 1.50* 1.68* 1.23* 1.14* 1.15*  CALCIUM 10.3 9.9 8.9 9.2 9.2   Liver Function Tests:  Recent Labs Lab 11/21/13 2153  AST 22  ALT 7  ALKPHOS 89  BILITOT 0.4  PROT 7.6  ALBUMIN 2.3*   No results found for this basename: LIPASE, AMYLASE,  in the last 168 hours No results found for this basename: AMMONIA,  in the last 168 hours CBC:  Recent Labs Lab 11/21/13 2153 11/23/13 0416 11/24/13 0610  WBC 16.9* 13.3* 10.0  NEUTROABS 14.0*  --   --   HGB 12.9 10.3* 10.4*  HCT 38.6 30.7* 32.0*  MCV 93.9 92.5 95.2  PLT 421* 276 364   Cardiac Enzymes:  Recent Labs Lab 11/21/13 2153 11/26/13 0905  CKTOTAL 60 67  TROPONINI <0.30  --    BNP (last 3  results) No results found for this basename: PROBNP,  in the last 8760 hours CBG: No results found for this basename: GLUCAP,  in the last 168 hours  No results found for this or any previous visit (from the past 240 hour(s)).   Studies: No results found.  Scheduled Meds: . ciprofloxacin  500 mg Oral BID  . doxycycline  100 mg Oral Q12H  . feeding supplement (ENSURE COMPLETE)  237 mL Oral BID BM  . fentaNYL  12.5 mcg Transdermal Q72H  . heparin subcutaneous  5,000 Units Subcutaneous Q8H  . multivitamin with minerals  1 tablet Oral Daily  . nebivolol  10 mg Oral Daily  . senna-docusate  2 tablet Oral QHS  . sodium hypochlorite   Irrigation BID  . sodium polystyrene  30 g Oral Once  . vitamin C  500 mg Oral BID  . zinc sulfate  220 mg Oral Daily   Continuous  Infusions: . sodium chloride 40 mL/hr at 11/24/13 1042    Principal Problem:   Sacral decubitus ulcer, stage III Active Problems:   Hypertension   Renal disorder   Infected wound   Palliative care encounter    Time spent: >35 minutes    Esperanza Sheets  [facility] Pager [phone]. If 7PM-7AM, please contact night-coverage at [email or web address], password [facility] 11/28/2013, 10:29 AM  LOS: 7 days

## 2013-11-29 ENCOUNTER — Encounter: Payer: Self-pay | Admitting: Family Medicine

## 2013-11-29 ENCOUNTER — Non-Acute Institutional Stay (SKILLED_NURSING_FACILITY): Payer: 59 | Admitting: Internal Medicine

## 2013-11-29 DIAGNOSIS — G894 Chronic pain syndrome: Secondary | ICD-10-CM

## 2013-11-29 DIAGNOSIS — M169 Osteoarthritis of hip, unspecified: Secondary | ICD-10-CM

## 2013-11-29 DIAGNOSIS — L89309 Pressure ulcer of unspecified buttock, unspecified stage: Secondary | ICD-10-CM

## 2013-11-29 DIAGNOSIS — M1612 Unilateral primary osteoarthritis, left hip: Secondary | ICD-10-CM

## 2013-11-29 DIAGNOSIS — M1610 Unilateral primary osteoarthritis, unspecified hip: Secondary | ICD-10-CM

## 2013-11-29 DIAGNOSIS — I10 Essential (primary) hypertension: Secondary | ICD-10-CM

## 2013-11-29 MED ORDER — ZINC SULFATE 220 (50 ZN) MG PO CAPS: 220.0000 mg | ORAL_CAPSULE | Freq: Every day | ORAL | Status: DC

## 2013-11-29 MED ORDER — FENTANYL 12 MCG/HR TD PT72: 12.5000 ug | MEDICATED_PATCH | TRANSDERMAL | Status: DC

## 2013-11-29 MED ORDER — ASCORBIC ACID 500 MG PO TABS: 500.0000 mg | ORAL_TABLET | Freq: Two times a day (BID) | ORAL | Status: DC

## 2013-11-29 MED ORDER — DOXYCYCLINE HYCLATE 100 MG PO TABS: 100.0000 mg | ORAL_TABLET | Freq: Two times a day (BID) | ORAL | Status: DC

## 2013-11-29 MED ORDER — OXYCODONE-ACETAMINOPHEN 5-325 MG PO TABS: 1.0000 | ORAL_TABLET | Freq: Four times a day (QID) | ORAL | Status: DC | PRN

## 2013-11-29 MED ORDER — ENSURE COMPLETE PO LIQD: 237.0000 mL | Freq: Two times a day (BID) | ORAL | Status: DC

## 2013-11-29 MED ORDER — CIPROFLOXACIN HCL 500 MG PO TABS: 500.0000 mg | ORAL_TABLET | Freq: Two times a day (BID) | ORAL | Status: DC

## 2013-11-29 MED ORDER — SENNOSIDES-DOCUSATE SODIUM 8.6-50 MG PO TABS: 2.0000 | ORAL_TABLET | Freq: Every day | ORAL | Status: AC

## 2013-11-29 MED ORDER — ADULT MULTIVITAMIN W/MINERALS CH: 1.0000 | ORAL_TABLET | Freq: Every day | ORAL | Status: DC

## 2013-11-29 NOTE — Progress Notes (Signed)
F/u ethics note. Per Poonum Ambelal, family requesting discharge to the home, in spite of unsafe assessment.  Advised to contact risk mgmt in this instance.  Ambelal agreed.  Will await info from inpatient team.

## 2013-11-29 NOTE — Care Management Note (Signed)
CARE MANAGEMENT NOTE 11/29/2013  Patient:  Lindsay Browning   Account Number:  [id number]  Date Initiated:  11/25/2013  Documentation initiated by:  Vance PeperBRADY,Stiles Maxcy  Subjective/Objective Assessment:   [age] yr old female admitted with bilateral buttocks.     Action/Plan:   CM spoke with patient concerning HH needs and care at discharge.Unable to leave voicemail for daughter Lindsay Browning-[phone]. CM will follow   Anticipated DC Date:  11/29/2013   Anticipated DC Plan:  SKILLED NURSING FACILITY  In-house referral  Clinical Social Worker      DC Planning Services  CM consult      Choice offered to / List presented to:             Status of service:  Completed, signed off Medicare Important Message given?   (If response is "NO", the following Medicare IM given date fields will be blank) Date Medicare IM given:   Date Additional Medicare IM given:    Discharge Disposition:  SKILLED NURSING FACILITY  Per UR Regulation:    If discussed at Long Length of Stay Meetings, dates discussed:    Comments:  11/29/13 12:00pm Vance PeperSusan Hektor Huston, RN BSN Patient's daughter has agreed for her mom to go to SNF. Social worker has secured bed at [facility].

## 2013-11-29 NOTE — Consult Note (Signed)
WOC wound follow up Wound type: Unstageable pressure ulcer Measurement: unable to measure due to patient combative during the care Wound bed: the wound bed is somewhat improved today since my assessment on 11/22/13.  She still has some necrotic tissue, more on the right upper buttock and some on the left buttock.  Very difficult to assess this today as we are fighting with the patient during the care and trying to get her to turn is almost impossible.   Drainage (amount, consistency, odor) heavy, with odor, foul drainage yellow/brown drainage. Periwound:macerated from moisture would benefit from LALM for moisture management and to treat her pressure ulcer, however she has refused while inpatient.  Discharge Dressing procedure/placement/frequency: 1. Clean the wounds with normal saline 2.  Cover wounds with with Dakin's solution moistened gauze 3. Top with dry dressing and secure with tape  Assisted ambulance staff to transfer her to the stretcher for transport to SNF.   Eliab Closson [place] RN, [place] [phone]

## 2013-11-29 NOTE — Progress Notes (Signed)
Patient ID: Lindsay Browning, female   DOB: [birth year], 80 y.o.   MRN: [id number] Facility; [facility] Chief complaint; admission to SNF post stay it [employer] from 1/15 through 1/23  His; this is a 80 year old woman who was apparently living alone in [facility] She apparently developed left hip pain and was essentially immobilized by this. Patient may have been in [facility] a month or so ago. She was mostly nonambulatory at that time and subsequently developed a large sacral decubitus ulcer. She ended up back in her own home and was found by a neighbor lying down covered in feces and urine. She was found to have a large necrotic decubitus ulcer which was infected. Patient was empirically treated with vancomycin and Fortaz. The patient was restless agitated and combative. She was seen by psychiatry and deemed to lack capacity to make medical decisions. An MRI of the hip was attempted however this was abandoned once the patient became agitated and uncooperative for Marina Goodell the patient refused wound care oral medications. The patient was discharged to SNF on another 2 weeks of oral antibiotics.  The patient was also felt to have stage III chronic renal failure. Her discharge creatinine however was 1.15 as was seen by palliative care. She is a DO NOT RESUSCITATE and the patient's daughter who lives in [place] actually agreed with this.  Results for ENZLEY, KITCHENS (MRN [id number]) as of 11/29/2013 16:16  Ref. Range 11/21/2013 21:53 11/21/2013 23:03  Sodium Latest Range: 137-147 mEq/L 139   Potassium Latest Range: 3.7-5.3 mEq/L 5.8 (H)   Chloride Latest Range: 96-112 mEq/L 101   CO2 Latest Range: 19-32 mEq/L 21   BUN Latest Range: 6-23 mg/dL 44 (H)   Creatinine Latest Range: 0.50-1.10 mg/dL 7.82 (H)   Calcium Latest Range: 8.4-10.5 mg/dL 95.6   GFR calc non Af Amer Latest Range: >90 mL/min 29 (L)   GFR calc Af Amer Latest Range: >90 mL/min 34 (L)   Glucose Latest Range: 70-99 mg/dL 213  (H)   Alkaline Phosphatase Latest Range: 39-117 U/L 89   Albumin Latest Range: 3.5-5.2 g/dL 2.3 (L)   AST Latest Range: 0-37 U/L 22   ALT Latest Range: 0-35 U/L 7   Total Protein Latest Range: 6.0-8.3 g/dL 7.6   Total Bilirubin Latest Range: 0.3-1.2 mg/dL 0.4   CK Total Latest Range: 7-177 U/L 60   Troponin I Latest Range: <0.30 ng/mL <0.30   WBC Latest Range: 4.0-10.5 K/uL 16.9 (H)   RBC Latest Range: 3.87-5.11 MIL/uL 4.11   Hemoglobin Latest Range: 12.0-15.0 g/dL 08.6   HCT Latest Range: 36.0-46.0 % 38.6   MCV Latest Range: 78.0-100.0 fL 93.9   MCH Latest Range: 26.0-34.0 pg 31.4   MCHC Latest Range: 30.0-36.0 g/dL 57.8   RDW Latest Range: 11.5-15.5 % 13.4   Platelets Latest Range: 150-400 K/uL 421 (H)   Neutrophils Relative % Latest Range: 43-77 % 83 (H)   Lymphocytes Relative Latest Range: 12-46 % 11 (L)   Monocytes Relative Latest Range: 3-12 % 6   Eosinophils Relative Latest Range: 0-5 % 0   Basophils Relative Latest Range: 0-1 % 0             Monocytes Absolute Latest Range: 0.1-1.0 K/uL 1.0   Eosinophils Absolute Latest Range: 0.0-0.7 K/uL 0.0   Basophils Absolute Latest Range: 0.0-0.1 K/uL 0.0   DG CHEST 2 VIEW No range found  Rpt         Study Result    CLINICAL  DATA:  Skin ulcer   EXAM: LEFT HIP - COMPLETE 2+ VIEW   COMPARISON:  09/27/2013   FINDINGS: No evidence of acute fracture or dislocation involving the hips. The pelvic ring is intact. There is advanced lower lumbar facet degeneration. Calcified uterine fibroid noted. Punctate high-density material is chronically associated with the left gluteus musculature near the greater trochanter.   IMPRESSION: No acute osseous findings.     Electronically Signed   By: Tiburcio Pea M.D.   On: 11/22/2013 00:40    Past Medical History  Diagnosis Date  . Medical history unknown   . Hypertension   . Renal disorder    Medication vitamin C 500 twice a day, ciprofloxacin 500 mg by mouth twice a day,  doxycycline 100 every 12, Ensure 2 times daily, Duragesic patch 12.5 q. 72, oxycodone 5/325 one to 2 tablets every 6 hours when necessary, Senokot-S 2 tablets by mouth at bedtime  Social; the patient appears to live in her own home in [place]. She is able to give me an address however I am unable to verify it to. She is able to give me her date of birth, age. She stated she would do an incapacitated by left hip pain for 9 weeks stating that this started in late November. Her exact functional status and history is unclear. Has a daughter in [place] by the name of Lindsay Browning  [phone]  Family history; not available from any current source  Review of systems; all the patient talks about his severe left hip pain which has been going on for 9 weeks. Other than this it is virtually impossible to do any meaningful over review of systems on this patient.  Physical examination Gen. 80 year old woman basically he also screams with any attempts to touch or move her or help her even roll over in bed Oral exam she is a nodule as Respiratory clear entry bilaterally Cardiac heart sounds are normal she does not appear to be dehydrated Abdomen no liver no spleen no masses the patient again yells and screams with every attempt to touch her. Stating that she was getting "shots" in the hospital and that is why her belly is so tender GU has a Foley catheter in place Musculoskeletal widespread pain. I could not move her exam in any part of her lower extremities. She yells and screams in pain with movement of her bilateral legs knees hips shoulders I suspect this woman is very frail and I am doubtful she'll be able to ambulate at this point Neurologic Mental status. Between the yelling and screaming the patient but really comes across as being cognizant she is angry disinhibited and I certainly have understand the incompetency issue given the fact she won't even attempt to register information will respond  to it. Nevertheless I am not convinced she has significant dementia Skin; she has a large area of superficial stage II ulceration over her bilateral buttocks. Currently this does not appear to be grossly infected there was no evidence of a deep tissue problem here however the wounds and salts are covered with a large amount of adherent eschar. This is going to need a prolonged attempt at debridement 2. Although she is tender over both her heels there is no open area here. This certainly could be PAD  Impression/plan #1 widespread osteoarthritis involving hips knees shoulders however I am not certain that this is the only explanation for the patient's continued complaints of pain  #2 left hip pain see discussion  above #3 wide but superficial area of pressure ulceration over a large area of her buttock superior this is going to require treatment with Santyl and probably pulse lavage in the facility. #4 I suspect this woman will turn out to be nonambulatory, also suspect she is not capable of living on her own now or probably anytime in the future #5 has a Foley catheter in place. It is probably worthwhile leaving the same for now to protect the large area of wounds over her buttock #6 probable PAD #7 renal insufficiency #8 hypertension

## 2013-11-29 NOTE — Plan of Care (Signed)
Problem: Phase III Progression Outcomes Goal: Activity at appropriate level-compared to baseline (UP IN CHAIR FOR HEMODIALYSIS)  Outcome: Not Met (add Reason) Patient non-compliant.

## 2013-11-29 NOTE — Progress Notes (Signed)
CSW (Clinical Child psychotherapistocial Worker) received call from [place] with [facility] this morning. She informed CSW that after CSW spoke with pt daughter Lanier PrudeSharice yesterday, Lanier PrudeSharice called her and informed her that the plan will be for someone to pick her mother up from the hospital today and take her to her home " to let her die how she wants".  Sherri also informed CSW that Lanier PrudeSharice is pt's only child and Justin MendSheree is not of any relation to pt. Sherri stated that Justin MendSheree is [name]'s childhood friend but pt has stated in the past that [place] uses pt for money. CSW thanked Sherri for calling to report this information.   CSW has spoken with nursing and case management to inform of what CSW has been informed will be taking place today. CSW contacted ethics committee and it was suggested that CSW go ahead and report situation to risk management. CSW called Risk Management and left voicemail for [employer]. CSW also called and left message for Adult Protective Services.  Alesha Jaffee, LCSWA [phone]

## 2013-11-29 NOTE — Progress Notes (Signed)
CSW (Clinical Child psychotherapistocial Worker) received call from pt daughter Lanier PrudeSharice. [name] informed CSW that she is not aware of the full situation of the pt's living condition and is understanding of pt not being able to return there. Pt daughter is agreeable to SNF until they are able to find pt a new "home" living situation. CSW gave pt daughter bed offers. Pt daughter is wanting for [facility] to go [facility]. CSW spoke with MD and informed of pt daughter decision. CSW also confirmed with Darel HongJudy at facility that they are able to accept pt today.  Airon Sahni, LCSWA [phone]

## 2013-11-29 NOTE — Progress Notes (Signed)
Patient continues to refuse medications,repositioning, air mattress, etc.

## 2013-11-29 NOTE — Discharge Summary (Signed)
PATIENT DETAILS Name: Lindsay Browning Age: 80 y.o. Sex: female Date of Birth: 07/26/1919 MRN: [id number]. Admit Date: 11/21/2013 Admitting Physician: Haydee Monicaachal A David, MD NFA:OZHYQPCP:TRIPP, Sherilyn CooterHENRY, MD  Recommendations for Outpatient Follow-up:  1. Please have palliative care followup while at skilled nursing facility 2. Wound Care if patient cooperates, otherwise is supportive care  3. MRI left hip if patient cooperates 4. Continue empiric antibiotics for 2 weeks from the day of discharge.  PRIMARY DISCHARGE DIAGNOSIS:  Principal Problem:   Infected Sacral decubitus ulcer, unstageable- Active Problems:   Hypertension   Infected wound   Palliative care encounter   Left hip bursitis   Chronic kidney disease stage III     PAST MEDICAL HISTORY: Past Medical History  Diagnosis Date  . Medical history unknown   . Hypertension   . Renal disorder     DISCHARGE MEDICATIONS:   Medication List         ascorbic acid 500 MG tablet  Commonly known as:  VITAMIN C  Take 1 tablet (500 mg total) by mouth 2 (two) times daily.     ciprofloxacin 500 MG tablet  Commonly known as:  CIPRO  Take 1 tablet (500 mg total) by mouth 2 (two) times daily. For 2 weeks from discharge     doxycycline 100 MG tablet  Commonly known as:  VIBRA-TABS  Take 1 tablet (100 mg total) by mouth every 12 (twelve) hours. For 2 more weeks from discharge.     feeding supplement (ENSURE COMPLETE) Liqd  Take 237 mLs by mouth 2 (two) times daily between meals.     fentaNYL 12 MCG/HR  Commonly known as:  DURAGESIC - dosed mcg/hr  Place 1 patch (12.5 mcg total) onto the skin every 3 (three) days.     multivitamin with minerals Tabs tablet  Take 1 tablet by mouth daily.     nebivolol 10 MG tablet  Commonly known as:  BYSTOLIC  Take 10 mg by mouth daily.     oxyCODONE-acetaminophen 5-325 MG per tablet  Commonly known as:  PERCOCET/ROXICET  Take 1-2 tablets by mouth every 6 (six) hours as needed for moderate pain or  severe pain.     senna-docusate 8.6-50 MG per tablet  Commonly known as:  Senokot-S  Take 2 tablets by mouth at bedtime.     zinc sulfate 220 MG capsule  Take 1 capsule (220 mg total) by mouth daily.        ALLERGIES:   Allergies  Allergen Reactions  . Penicillins Other (See Comments)    Unknown - pt states she's never taken PCN before    BRIEF HPI:  See H&P, Labs, Consult and Test reports for all details in brief, patient is a 80 year old female who unfortunately was living alone, she was [facility], continue to have left hip pain and was apparently nonambulatory. She was found by her neighbor, lying down, covered in urine and feces. She was then [facility], and subsequently transferred to the hospitalist service at [facility]  CONSULTATIONS:   orthopedic surgery and Ethics Psychiatry  PERTINENT RADIOLOGIC STUDIES: Dg Chest 2 View  11/22/2013   CLINICAL DATA:  Skin ulcer.  EXAM: CHEST  2 VIEW  COMPARISON:  None currently available  FINDINGS: No cardiomegaly. No acute infiltrate or edema. No effusion or pneumothorax. No acute osseous findings.  IMPRESSION: No active cardiopulmonary disease.   Electronically Signed   By: Audry RilesJonathan  Watts M.D.  On: 11/22/2013 00:03   Dg Hip Complete Left  11/22/2013   CLINICAL DATA:  Skin ulcer  EXAM: LEFT HIP - COMPLETE 2+ VIEW  COMPARISON:  09/27/2013  FINDINGS: No evidence of acute fracture or dislocation involving the hips. The pelvic ring is intact. There is advanced lower lumbar facet degeneration. Calcified uterine fibroid noted. Punctate high-density material is chronically associated with the left gluteus musculature near the greater trochanter.  IMPRESSION: No acute osseous findings.   Electronically Signed   By: Tiburcio Pea M.D.   On: 11/22/2013 00:40     PERTINENT LAB RESULTS: CBC: No results found for this basename: WBC, HGB, HCT, PLT,  in the last 72  hours CMET CMP     Component Value Date/Time   NA 138 11/26/2013 0905   K 4.7 11/26/2013 0905   CL 104 11/26/2013 0905   CO2 17* 11/26/2013 0905   GLUCOSE 78 11/26/2013 0905   BUN 26* 11/26/2013 0905   CREATININE 1.15* 11/26/2013 0905   CALCIUM 9.2 11/26/2013 0905   PROT 7.6 11/21/2013 2153   ALBUMIN 2.3* 11/21/2013 2153   AST 22 11/21/2013 2153   ALT 7 11/21/2013 2153   ALKPHOS 89 11/21/2013 2153   BILITOT 0.4 11/21/2013 2153   GFRNONAA 40* 11/26/2013 0905   GFRAA 46* 11/26/2013 0905    GFR Estimated Creatinine Clearance: 34.8 ml/min (by C-G formula based on Cr of 1.15). No results found for this basename: LIPASE, AMYLASE,  in the last 72 hours No results found for this basename: CKTOTAL, CKMB, CKMBINDEX, TROPONINI,  in the last 72 hours No components found with this basename: POCBNP,  No results found for this basename: DDIMER,  in the last 72 hours No results found for this basename: HGBA1C,  in the last 72 hours No results found for this basename: CHOL, HDL, LDLCALC, TRIG, CHOLHDL, LDLDIRECT,  in the last 72 hours No results found for this basename: TSH, T4TOTAL, FREET3, T3FREE, THYROIDAB,  in the last 72 hours No results found for this basename: VITAMINB12, FOLATE, FERRITIN, TIBC, IRON, RETICCTPCT,  in the last 72 hours Coags: No results found for this basename: PT, INR,  in the last 72 hours Microbiology: No results found for this or any previous visit (from the past 240 hour(s)).   BRIEF HOSPITAL COURSE:  Infected sacral decubitus-Unstageable - Unfortunately, patient apparently has been nonambulatory for a few weeks prior to admission. From the history that was gathered after talking with patient, family, reviewing the chart, patient was discharged from [facility] may be a month or so prior to this admission. She was still having significant left hip pain at the time of discharge, unfortunately post discharge it seems like she was mostly non-ambulatory, and  subsequently developed a large sacral decubitus ulcer. She was found by her neighbor, lying down and covered in feces and urine. She was initially evaluated at Med center [facility], and transferred to the hospitalist service at [facility]. - Patient unfortunately, has been very uncooperative to physical exam, wound care. She has been agitated and combative at times. In any event, she was briefly seen by the wound care team, who did a very limited exam and found that the ulcer was necrotic and measured 20 cm x30 cm in size with black soft eschar covering the wound. This was located in the sacral area. Given the fact that the patient had leukocytosis, patient was empirically started on vancomycin and Fortaz. Given the patient was persistently refusing wound care,  medications, being agitated and combative, psychiatry was consulted, they deemed the patient to lack capacity take a medical decisions. Even During my evaluation is, patient seemed to not understand what was going on with her, and did not seem to understand the importance of wound care. Her only focus was on being discharged home. Subsequently, patient's daughter Lindsay Browning in [place] ([place] [phone]) was contacted. Multiple discussions were held by this M.D. with Ms. Lindsay Browning over the phone. I repeatedly  explained, that patient requires lon term care, long-term antibiotics and for that we needed patient's cooperation. Patient's daughter and initially wanted sedation for diagnostic tests and wound care, however have explained that it is not possible for [place] to sedate her multiple times a day, and for this wound to heal we would require significant patient cooperation.Otherwise, this wound would only get worse causing, sepsis, osteomyelitis and be potentially life threatening. Unfortunately, during this hospital course, patient continued to be very uncooperative and at times even refused oral medications. We did attempt to do a MRI of  the hip, however was abandoned once patient IV access was lost. Attempts were made to put a PICC line, patient was premedicated with IM Ativan, however given patient's agitation and combative behavior, this was then discontinued as well. It was felt, that since patient needed long-term wound care, and needed significant patient cooperation, placing a PICC line under conscious sedation was not going to change the overall management. It was also felt, that getting a MRI hip would not change the overall outcome, as the patient kept on refusing wound care, and oral medications etc. It was also felt that given patient's combativeness, that she was likely to put out the IV line. It was explained to the family in great detail, that for the wound to heal, we needed not only antibiotics but persistent/continued on care, and for that we needed patient cooperation, anything short of that would likely not result in wound healing, would probably result in continued deterioration of the patient, osteomyelitis, sepsis and possible death. Patient's daughter, did speak with the patient over the phone, even she was unable to convince the patient to participate in wound care. Patient was then switched over to oral antibiotics in the form of ciprofloxacin and doxycycline. Palliative care medicine was then consulted. Social services and case management services were all involved. Multiple discussions were held with the above services, family was insisting on sending her home. However they were not able to arrange for 24/7 care. Case management had multiple home health agencies inspect the patient's house, unfortunately it was deemed not to be livable. Subsequently ethics committee was consulted. After a lot of it back and forth with family, finally the family has agreed to discharge the patient to a skilled nursing facility in the short term, till they are able to arrange a better living situation at home for this patient. - Since,  patient being discharged to a skilled nursing facility, will place patient on oral antibiotics for another 2 weeks. Wound care will need to be continued if patient cooperates, otherwise just provide supportive care. - Suspect patient continue to worsen, hence would benefit from palliative care followup while at skilled nursing facility.  Bursitis left hip  - Consulted Dr. Magnus Ivan orthopedics to evaluate if patient is a candidate for intra-articular steroid injection.However patient was uncooperative with him as well. - MRI hip was ordered, however not able to be done because of lack of patient cooperation and lack of IV access. - X-ray  of the left hip done on 1/15 did not show any acute osseous findings.  Suspected acute on chronic kidney disease stage III  - Unknown baseline.  - Have asked RN to get medical records from [facility], however patient refused to sign a release of medical records form. - Patient was gently hydrated, creatinine on admission was 1.5, it peaked to 1.68, however with hydration it came down to 1.15 on 1/20.  Hyperkalemia  - resolved. With kayexalate.   Hypertension  - Continue with Bystolic   Ethics - Seen by psychiatry this admission-patient has no capacity to make medical decisions. Patient's daughter lives in [place]-her name is Lindsay Browning [phone]. She is making all decisions for the patient. - Seen by palliative care this admission, DO NOT RESUSCITATE status. Per social worker who spoke with agents daughter on 1/23, to continue with DO NOT RESUSCITATE status. - Very difficult situation, given patient's lack of capacity to understand the situation, lack of cooperation to wound care and antibiotics, suspect that patient will only continue to deteriorate. She would benefit from palliative care followup at the skilled nursing facility who can continue to engage family, at some point she will benefit from transitioning to full comfort  care status. Unfortunately, patient's daughter is the only immediate family much she is in [place].  TODAY-DAY OF DISCHARGE:  Subjective:   Neddie Steedman today has had no major events overnight. Continues to refuse some medications, continues to refuse wound care.  Objective:   Blood pressure 135/56, pulse 80, temperature 98.2 F (36.8 C), temperature source Oral, resp. rate 18, height 5\' 6"  (1.676 m), weight 87.544 kg (193 lb), SpO2 99.00%.  Intake/Output Summary (Last 24 hours) at 11/29/13 1133 Last data filed at 11/28/13 1900  Gross per 24 hour  Intake     90 ml  Output    300 ml  Net   -210 ml   Filed Weights   11/22/13 0900  Weight: 87.544 kg (193 lb)    Exam Awake and somewhat Alert [place].AT,PERRAL Supple Neck,No JVD, No cervical lymphadenopathy appriciated.  Symmetrical Chest wall movement, Good air movement bilaterally, CTAB RRR,No Gallops,Rubs or new Murmurs, No Parasternal Heave +ve B.Sounds, Abd Soft, Non tender, No organomegaly appriciated, No rebound -guarding or rigidity. No Cyanosis, Clubbing or edema, No new Rash or bruise  DISCHARGE CONDITION: Stable  DISPOSITION: SNF  DISCHARGE INSTRUCTIONS:    Activity:  As tolerated with Full fall precautions use walker/cane & assistance as needed  Diet recommendation: Heart Healthy diet       Discharge Orders   Future Orders Complete By Expires   Call MD for:  redness, tenderness, or signs of infection (pain, swelling, redness, odor or green/yellow discharge around incision site)  As directed    Diet - low sodium heart healthy  As directed    Diet - low sodium heart healthy  As directed    Increase activity slowly  As directed    Increase activity slowly  As directed       Follow-up Information   Follow up with Florentina Jenny, MD In 1 week. (after discharge from SNF)    Specialty:  Family Medicine   Contact information:   [phone] [address] [place] [address] [phone]       Total Time spent on discharge equals 45 minutes.  SignedJeoffrey Massed 11/29/2013 11:33 AM

## 2013-11-29 NOTE — Progress Notes (Signed)
Ethics note:  Call from SW.  Family agreed to SNF.  Will sign off.

## 2013-11-29 NOTE — Progress Notes (Signed)
CSW (Clinical Social Worker) prepared pt dc packet and placed with shadow chart. CSW arranged non-emergent ambulance transport. Pt family, pt nurse, and facility informed. CSW signing off.  Yurem Viner, LCSWA [phone]  

## 2013-11-29 NOTE — Progress Notes (Signed)
Patient refused 2200 meds as well as 2000 cipro. Patient also refused to have dressing changed. Tried to explain to patient the importance of taking the antibiotics and changing the dressing. Patient continued to express frustration with the "care" that she is receiving. Patient repeatedly said that "she was handled roughly yesterday" and also stated that "she can't get no rest because everybody won't leave her alone". Patient also believes that she is going back to her home so that she can heal and get better. Will continue to monitor.

## 2013-12-17 ENCOUNTER — Non-Acute Institutional Stay (SKILLED_NURSING_FACILITY): Payer: 59 | Admitting: Internal Medicine

## 2013-12-17 DIAGNOSIS — G894 Chronic pain syndrome: Secondary | ICD-10-CM

## 2013-12-17 DIAGNOSIS — I10 Essential (primary) hypertension: Secondary | ICD-10-CM

## 2013-12-17 DIAGNOSIS — L89309 Pressure ulcer of unspecified buttock, unspecified stage: Secondary | ICD-10-CM

## 2013-12-17 NOTE — Progress Notes (Signed)
Patient ID: Lindsay Browning, female   DOB: 06-22-1919, 80 y.o.   MRN: [id number] Facility; [facility] Chief complaint; followup large area of superficial stage II wounds on both her buttocks History; this is a very difficult 80 year old woman admitted to the facility in late January. She had come from [facility] where she had been found at home immobilized, and covered in urine and stool. She was admitted to [facility] there was a history of pain and about a pain in the left hip for the patient did not cooperate with investigation including an MRI. She was discovered to have infected decubitus ulcers over a large area of her buttocks and she was treated empirically with intravenous antibiotics. She was transferred to this skilled facility for ongoing care. She was felt to have a bursitis of the left hip. X-ray of the left hip did not show any acute findings. She was restless and uncooperative with any of the consultants in the hospital. She was seen by psychiatry and was judged not to have capacity to make medical decisions. She was I gather suggested for palliative care  Since her arrival in this facility she is continued to be completely uncooperative for. She will not take her medications but occasionally will choose her blood pressure medication I ordered lab work but apparently she would not agree to have it done. She eats poorly  Review of systems;  not possible in this patient she keeps on talking about an "pinched nerve in her hip"   Physical exam; Gen. patient looks somewhat depressed looking; she is loud and argumentative.  Respiratory; clear entry bilaterally Cardiac Heart sounds are normal she does not look to be grossly dehydrated Skin; the staff here been a good job with these they now have healthy granulation there is no evidence of infection. There still using Santyl based dressings over they may be able to change these through a silver alginate next  week Musculoskeletal; unfortunately the patient screams in pain when I attempt to do anything with her legs or her feet. Turning her over in bed he is a real as the patient is restless and combative  Mental status; patient is loud, agitated and I believe somewhat depressed. Of the few questions I am able to get through I believe that this woman is orientated to time. She was able to tell me her address and date of birth. I suspect she is probably cognitively intact but that does not necessarily mean that she is competent to make decisions as she will not cooperate enough for me to formally test this. She simply will not listen or cooperate in a fashion that would allow me to make a firm decision  Impression/plan #1 depression with severe anxiety; whether she could have an underlying bipolar type presentation I am uncertain. This would be helpful with more collateral his. Attempt Prozac liquid which will need to be given surreptitiously #2 decubitus ulcers on her coccyx these MRI closely a lot better with healthy granulation and the remain stage II wounds. She has a Foley catheter in place to protect these fragile areas which were infected at the time she arrived in [facility] #3 complaints of pain; this is the worst part of his presentation. I am assuming that this is generalized osteoarthritis however if she has a more sinister cause of this presentation it is almost impossible to meaningfully examined her. She has not allowed testing either here or in the hospital. Feel forced to go  ahead and treat this poor woman empirically and I'm going to try her on a fentanyl patch at a low dose #4 chronic renal insufficiency; probably secondary to poorly controlled hypertension. She had hyperkalemia in the hospital but I have not been able to recheck this #5 hypertension  I find this to be a very difficult situation. I am going to make an attempt to empirically monitor manage her pain whenever the cause  while attempting to treat her for depression. Beyond this I don't think there is much hope to improve the quality of this woman's day

## 2013-12-24 ENCOUNTER — Encounter: Payer: Self-pay | Admitting: *Deleted

## 2013-12-31 ENCOUNTER — Other Ambulatory Visit: Payer: Self-pay | Admitting: *Deleted

## 2013-12-31 MED ORDER — FENTANYL 12 MCG/HR TD PT72: 12.5000 ug | MEDICATED_PATCH | TRANSDERMAL | Status: DC

## 2014-01-31 ENCOUNTER — Other Ambulatory Visit: Payer: Self-pay

## 2014-01-31 MED ORDER — OXYCODONE-ACETAMINOPHEN 5-325 MG PO TABS: 1.0000 | ORAL_TABLET | Freq: Four times a day (QID) | ORAL | Status: DC | PRN

## 2014-01-31 NOTE — Telephone Encounter (Signed)
Rx faxed to [facility] @ [phone], phone number [phone]  

## 2014-02-04 ENCOUNTER — Other Ambulatory Visit: Payer: Self-pay | Admitting: *Deleted

## 2014-02-04 MED ORDER — FENTANYL 12 MCG/HR TD PT72: MEDICATED_PATCH | TRANSDERMAL | Status: DC

## 2014-02-04 NOTE — Telephone Encounter (Signed)
[facility] 

## 2014-02-26 ENCOUNTER — Other Ambulatory Visit: Payer: Self-pay | Admitting: *Deleted

## 2014-02-26 MED ORDER — FENTANYL 12 MCG/HR TD PT72: MEDICATED_PATCH | TRANSDERMAL | Status: DC

## 2014-02-26 NOTE — Telephone Encounter (Signed)
[facility] 

## 2014-03-05 ENCOUNTER — Non-Acute Institutional Stay (SKILLED_NURSING_FACILITY): Payer: 59 | Admitting: Internal Medicine

## 2014-03-05 DIAGNOSIS — K5900 Constipation, unspecified: Secondary | ICD-10-CM

## 2014-03-05 DIAGNOSIS — I150 Renovascular hypertension: Secondary | ICD-10-CM

## 2014-03-05 DIAGNOSIS — G894 Chronic pain syndrome: Secondary | ICD-10-CM

## 2014-03-05 DIAGNOSIS — N189 Chronic kidney disease, unspecified: Secondary | ICD-10-CM

## 2014-03-07 DIAGNOSIS — K5900 Constipation, unspecified: Secondary | ICD-10-CM | POA: Insufficient documentation

## 2014-03-07 DIAGNOSIS — N189 Chronic kidney disease, unspecified: Secondary | ICD-10-CM | POA: Insufficient documentation

## 2014-03-07 DIAGNOSIS — G894 Chronic pain syndrome: Secondary | ICD-10-CM | POA: Insufficient documentation

## 2014-03-07 DIAGNOSIS — I150 Renovascular hypertension: Secondary | ICD-10-CM | POA: Insufficient documentation

## 2014-03-07 NOTE — Progress Notes (Signed)
         PROGRESS NOTE  DATE: 03/05/2014  FACILITY: Nursing Home Location: [facility]  LEVEL OF CARE: SNF (31)  Routine Visit  CHIEF COMPLAINT:  Manage renovascular hypertension, chronic kidney disease and chronic pain  HISTORY OF PRESENT ILLNESS:  REASSESSMENT OF ONGOING PROBLEM(S):  CHRONIC KIDNEY DISEASE: The patient's chronic kidney disease remains stable.  Staff deny increasing lower extremity swelling or confusion. Last BUN and creatinine are: 18/1.18 on 02-27-14. Patient is a poor historian.  HTN: Pt 's HTN remains stable.  Staff Deny CP, sob, DOE, pedal edema, headaches, dizziness or visual disturbances.  No complications from the medications currently being used.  Last BP : 104/68.  CHRONIC PAIN: The patient's chronic pain remains stable.  No complications reported from the medications presently being used.  Staff deny ongoing pain.  PAST MEDICAL HISTORY : Reviewed.  No changes/see problem list  CURRENT MEDICATIONS: Reviewed per MAR/see medication list  REVIEW OF SYSTEMS: Unobtainable secondary to patient being a poor historian.  PHYSICAL EXAMINATION  VS:  See VS section  GENERAL: no acute distress, normal body habitus EYES: conjunctivae normal, sclerae normal, normal eye lids NECK: supple, trachea midline, no neck masses, no thyroid tenderness, no thyromegaly LYMPHATICS: no LAN in the neck, no supraclavicular LAN RESPIRATORY: breathing is even & unlabored, BS CTAB CARDIAC: RRR, no murmur,no extra heart sounds, no edema GI: abdomen soft, normal BS, no masses, no tenderness, no hepatomegaly, no splenomegaly PSYCHIATRIC: the patient is alert & disoriented, affect & behavior appropriate  LABS/RADIOLOGY:  4-15 CBC normal, creatinine 1.18 otherwise CMP normal  ASSESSMENT/PLAN:  Renovascular hypertension-well controlled Chronic kidney disease-stable Chronic pain-patient appears comfortable Constipation-well controlled Anxiety-Ativan was  started Depression-Prozac was started  CPT CODE: 5284199309  Angela CoxGayani Y Dasanayaka, MD [facility] [phone]

## 2014-03-26 ENCOUNTER — Non-Acute Institutional Stay (SKILLED_NURSING_FACILITY): Payer: 59 | Admitting: Internal Medicine

## 2014-03-26 DIAGNOSIS — G894 Chronic pain syndrome: Secondary | ICD-10-CM

## 2014-03-26 DIAGNOSIS — I150 Renovascular hypertension: Secondary | ICD-10-CM

## 2014-03-26 DIAGNOSIS — N189 Chronic kidney disease, unspecified: Secondary | ICD-10-CM

## 2014-03-26 DIAGNOSIS — K5900 Constipation, unspecified: Secondary | ICD-10-CM

## 2014-03-27 NOTE — Progress Notes (Signed)
                PROGRESS NOTE  DATE: 03-26-14  FACILITY: Nursing Home [facility]  LEVEL OF CARE: SNF (31)  Routine Visit  CHIEF COMPLAINT:  Manage renovascular hypertension, chronic kidney disease and chronic pain  HISTORY OF PRESENT ILLNESS:  REASSESSMENT OF ONGOING PROBLEM(S):  CHRONIC KIDNEY DISEASE: The patient's chronic kidney disease remains stable.  Staff deny increasing lower extremity swelling or confusion. Last BUN and creatinine are: 18/1.18 on 02-27-14. Patient is a poor historian.  HTN: Pt 's HTN remains stable.  Staff Deny CP, sob, DOE, pedal edema, headaches, dizziness or visual disturbances.  No complications from the medications currently being used.  Last BP : 104/68.  CHRONIC PAIN: The patient's chronic pain remains stable.  No complications reported from the medications presently being used.  Staff deny ongoing pain.  PAST MEDICAL HISTORY : Reviewed.  No changes/see problem list  CURRENT MEDICATIONS: Reviewed per MAR/see medication list  REVIEW OF SYSTEMS: Unobtainable secondary to patient being a poor historian.  PHYSICAL EXAMINATION  VS:  See VS section  GENERAL: no acute distress, normal body habitus NECK: supple, trachea midline, no neck masses, no thyroid tenderness, no thyromegaly RESPIRATORY: breathing is even & unlabored, BS CTAB CARDIAC: RRR, no murmur,no extra heart sounds, no edema GI: abdomen soft, normal BS, no masses, no tenderness, no hepatomegaly, no splenomegaly PSYCHIATRIC: the patient is alert & disoriented, affect & behavior appropriate  LABS/RADIOLOGY:  4-15 CBC normal, creatinine 1.18 otherwise CMP normal  ASSESSMENT/PLAN:  Renovascular hypertension-well controlled Chronic kidney disease-stable Chronic pain-patient appears comfortable Constipation-well controlled Anxiety-on Ativan Depression-on Prozac  CPT CODE: [id number]  Angela CoxGayani Y Coletta Lockner, MD [facility] [phone]

## 2014-04-02 ENCOUNTER — Non-Acute Institutional Stay (SKILLED_NURSING_FACILITY): Payer: 59 | Admitting: Internal Medicine

## 2014-04-02 DIAGNOSIS — N189 Chronic kidney disease, unspecified: Secondary | ICD-10-CM

## 2014-04-07 NOTE — Progress Notes (Signed)
Patient ID: Lindsay Browning, female   DOB: [birth year], 80 y.o.   MRN: [id number]            PROGRESS NOTE  DATE: 04/02/2014       FACILITY:  [facility]  LEVEL OF CARE: SNF (31)  Acute Visit  CHIEF COMPLAINT:  Manage chronic kidney disease.    HISTORY OF PRESENT ILLNESS: I was requested by the staff to assess the patient regarding above problem(s):  CHRONIC KIDNEY DISEASE: The patient's chronic kidney disease is unstable.  Patient denies increasing lower extremity swelling or confusion. Last BUN and creatinine are:   On 04/01/2014:  BUN 21, creatinine 1.33.  In 02/2014:  Creatinine 1.18.  Patient overall is a poor historian.  She is currently not on renal toxic medications.    PAST MEDICAL HISTORY : Reviewed.  No changes/see problem list  CURRENT MEDICATIONS: Reviewed per MAR/see medication list  REVIEW OF SYSTEMS:  Unobtainable.  Patient is a poor historian.      PHYSICAL EXAMINATION  VS:  T 97       P 58      RR 20      BP 104/68     POX 98% room air       WT (Lb) 185           GENERAL: no acute distress, normal body habitus NECK: supple, trachea midline, no neck masses, no thyroid tenderness, no thyromegaly RESPIRATORY: breathing is even & unlabored, BS CTAB CARDIAC: RRR, no murmur,no extra heart sounds, no edema GI: abdomen soft, normal BS, no masses, no tenderness, no hepatomegaly, no splenomegaly PSYCHIATRIC: the patient is alert, disoriented, affect & behavior appropriate  ASSESSMENT/PLAN:  Chronic kidney disease.  Unstable problem.   Renal functions are worsening.  Recheck in one week.    CPT CODE: 92119       Angela Cox, MD [facility] [phone]

## 2014-04-09 ENCOUNTER — Non-Acute Institutional Stay (SKILLED_NURSING_FACILITY): Payer: 59 | Admitting: Internal Medicine

## 2014-04-09 DIAGNOSIS — E039 Hypothyroidism, unspecified: Secondary | ICD-10-CM

## 2014-04-13 NOTE — Progress Notes (Signed)
Patient ID: Lindsay Browning, female   DOB: October 23, 1919, 80 y.o.   MRN: [id number]            PROGRESS NOTE  DATE: 04/09/2014       FACILITY:  [facility]  LEVEL OF CARE: SNF (31)  Acute Visit  CHIEF COMPLAINT:  Manage hypothyroidism.      HISTORY OF PRESENT ILLNESS: I was requested by the staff to assess the patient regarding above problem(s):  On 04/08/2014:  Patient's TSH was 7.42.  Patient does not have a history of hypothyroidism.  She is overall a poor historian.      PAST MEDICAL HISTORY : Reviewed.  No changes/see problem list  CURRENT MEDICATIONS: Reviewed per MAR/see medication list  REVIEW OF SYSTEMS:  Unobtainable due to patient being a poor historian.    PHYSICAL EXAMINATION  VS:  T 96.8       P 61      RR 18      BP 120/66     POX 98%       WT (Lb) 190      GENERAL: no acute distress, moderately obese body habitus NECK: supple, trachea midline, no neck masses, no thyroid tenderness, no thyromegaly RESPIRATORY: breathing is even & unlabored, BS CTAB CARDIAC: RRR, no murmur,no extra heart sounds, no edema GI: abdomen soft, normal BS, no masses, no tenderness, no hepatomegaly, no splenomegaly PSYCHIATRIC: the patient is alert, combative, unable to assess orientation       ASSESSMENT/PLAN:  Hypothyroidism.  New problem.  No need for levothyroxine at this point.  Recheck TSH in eight weeks.  CPT CODE: 72094       Angela Cox, MD [facility] [phone]

## 2014-04-14 DIAGNOSIS — E039 Hypothyroidism, unspecified: Secondary | ICD-10-CM | POA: Insufficient documentation

## 2014-04-17 ENCOUNTER — Other Ambulatory Visit: Payer: Self-pay | Admitting: *Deleted

## 2014-04-17 MED ORDER — FENTANYL 12 MCG/HR TD PT72: MEDICATED_PATCH | TRANSDERMAL | Status: DC

## 2014-04-17 NOTE — Progress Notes (Signed)
Script faxed to [place]  For fentanyl patch.

## 2014-05-08 ENCOUNTER — Other Ambulatory Visit: Payer: Self-pay

## 2014-05-08 MED ORDER — OXYCODONE-ACETAMINOPHEN 5-325 MG PO TABS: ORAL_TABLET | ORAL | Status: DC

## 2014-05-08 NOTE — Telephone Encounter (Signed)
Rx faxed to [facility] @ [phone], phone number [phone]  

## 2014-05-14 ENCOUNTER — Other Ambulatory Visit: Payer: Self-pay | Admitting: *Deleted

## 2014-05-14 MED ORDER — LORAZEPAM 0.5 MG PO TABS: ORAL_TABLET | ORAL | Status: DC

## 2014-05-14 NOTE — Telephone Encounter (Signed)
[facility] 

## 2014-05-15 ENCOUNTER — Other Ambulatory Visit: Payer: Self-pay | Admitting: *Deleted

## 2014-05-15 MED ORDER — FENTANYL 12 MCG/HR TD PT72: MEDICATED_PATCH | TRANSDERMAL | Status: DC

## 2014-05-15 NOTE — Telephone Encounter (Signed)
[facility] 

## 2014-05-23 ENCOUNTER — Non-Acute Institutional Stay (SKILLED_NURSING_FACILITY): Payer: 59 | Admitting: Internal Medicine

## 2014-05-23 DIAGNOSIS — N189 Chronic kidney disease, unspecified: Secondary | ICD-10-CM

## 2014-05-23 DIAGNOSIS — M1610 Unilateral primary osteoarthritis, unspecified hip: Secondary | ICD-10-CM

## 2014-05-23 DIAGNOSIS — L89309 Pressure ulcer of unspecified buttock, unspecified stage: Secondary | ICD-10-CM

## 2014-05-23 DIAGNOSIS — G894 Chronic pain syndrome: Secondary | ICD-10-CM

## 2014-05-23 DIAGNOSIS — M1612 Unilateral primary osteoarthritis, left hip: Secondary | ICD-10-CM

## 2014-05-23 DIAGNOSIS — M169 Osteoarthritis of hip, unspecified: Secondary | ICD-10-CM

## 2014-05-24 NOTE — Progress Notes (Signed)
Patient ID: Lindsay Browning, female   DOB: [birth year], 80 y.o.   MRN: [id number] [facility] Chief complaint; review of medical issues transfer to [facility] History; this is a patient I admitted to the facility in January 2015. She came to us I believe from home after after a stay in acute care and having being found in a severe state of disrepair at home. She had recently been in [facility] with a large necrotic decubitus ulcer on her box. She was treated with broad-spectrum antibiotics at the time. The patient complained of widespread pain especially in her left hip and an MRI was attempted however this was not able to be done the tube her non-cooperative state.  By and large her lack of cooperation with any form of care as continued in the facility. I have assumed her pain is due to widespread osteoarthritis and I have simply been managing her with analgesics [Duragesic patch 12 mcg per hour change q. 72]. She has some degree of chronic renal insufficiency. The condition of her box wounds has waxed and waned however they have clearly not resolved although they are better now listed as stage II. There has never been any cooperation with physical therapy therefore no ability to improve her functional level. In fact the patient is almost paranoid today talking about how this was a conspiracy to take her money etc. she is followed by psychiatry. She is on Prozac and more recently Seroquel.  Past medical history/problem list #1 widespread osteoarthritis #2 severe left hip pain; this was worked up in the hospital before her transfer here. 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 72 Vitamin C 500 twice a day Ativan 0.5 twice a day Seroquel 25 each bedtime Senokot S2 tablets by mouth every night  Physical examination Vitals temperature 96.8-pulse 61-respirations 18-blood pressure 120/66-weight 190 pounds-O2 sat 96% on room air Respiratory clear entry bilaterally cardiac heart sounds are normal no murmurs abdomen no major abnormalities. Musculoskeletal much the same as when she came in the building there is a lot of pain and immobility. Skin there is no ability today to see these wounds.  Labs; there is no lab work on the chart the patient may be continuing her to refuse it. She is also refusing annual influenza vaccine and the coccal vaccine  Impression/plan #1 depressive psychosis I agree with the Seroquel at night in fact this may need to be increased. I put her on the Prozac as she was refusing any type of antidepressants #2 chronic renal insufficiency air he is secondary to poorly controlled hypertension her blood pressure is under control #3 widespread severe generalized osteoarthritis. If there is another explanation for her pain she never really let me do enough of it in exam or tests in order to confirm or refute this assumption

## 2014-05-26 ENCOUNTER — Other Ambulatory Visit: Payer: Self-pay | Admitting: *Deleted

## 2014-05-26 MED ORDER — OXYCODONE-ACETAMINOPHEN 5-325 MG PO TABS: ORAL_TABLET | ORAL | Status: DC

## 2014-05-26 NOTE — Telephone Encounter (Signed)
[facility] 

## 2014-06-17 ENCOUNTER — Other Ambulatory Visit: Payer: Self-pay | Admitting: *Deleted

## 2014-06-17 MED ORDER — FENTANYL 12 MCG/HR TD PT72: MEDICATED_PATCH | TRANSDERMAL | Status: DC

## 2014-06-17 NOTE — Telephone Encounter (Signed)
[facility] 

## 2014-06-22 ENCOUNTER — Encounter (HOSPITAL_COMMUNITY): Payer: Self-pay | Admitting: Emergency Medicine

## 2014-06-22 ENCOUNTER — Emergency Department (HOSPITAL_COMMUNITY)
Admission: EM | Admit: 2014-06-22 | Discharge: 2014-06-22 | Disposition: A | Discharge status: Home / Self Care | Payer: Medicare Other | Attending: Emergency Medicine | Admitting: Emergency Medicine

## 2014-06-22 DIAGNOSIS — E860 Dehydration: Secondary | ICD-10-CM | POA: Insufficient documentation

## 2014-06-22 DIAGNOSIS — Z79899 Other long term (current) drug therapy: Secondary | ICD-10-CM | POA: Diagnosis not present

## 2014-06-22 DIAGNOSIS — Z87448 Personal history of other diseases of urinary system: Secondary | ICD-10-CM | POA: Diagnosis not present

## 2014-06-22 DIAGNOSIS — Z880 Allergy status to penicillin: Secondary | ICD-10-CM | POA: Diagnosis not present

## 2014-06-22 DIAGNOSIS — I10 Essential (primary) hypertension: Secondary | ICD-10-CM | POA: Insufficient documentation

## 2014-06-22 LAB — CBC WITH DIFFERENTIAL/PLATELET
BASOS ABS: 0.1 10*3/uL (ref 0.0–0.1)
Basophils Relative: 1 % (ref 0–1)
EOS PCT: 3 % (ref 0–5)
Eosinophils Absolute: 0.2 10*3/uL (ref 0.0–0.7)
HEMATOCRIT: 41.3 % (ref 36.0–46.0)
HEMOGLOBIN: 13 g/dL (ref 12.0–15.0)
Lymphocytes Relative: 27 % (ref 12–46)
Lymphs Abs: 1.4 10*3/uL (ref 0.7–4.0)
MCH: 29.6 pg (ref 26.0–34.0)
MCHC: 31.5 g/dL (ref 30.0–36.0)
MCV: 94.1 fL (ref 78.0–100.0)
MONO ABS: 0.7 10*3/uL (ref 0.1–1.0)
MONOS PCT: 13 % — AB (ref 3–12)
Neutro Abs: 2.9 10*3/uL (ref 1.7–7.7)
Neutrophils Relative %: 56 % (ref 43–77)
Platelets: 244 10*3/uL (ref 150–400)
RBC: 4.39 MIL/uL (ref 3.87–5.11)
RDW: 14.1 % (ref 11.5–15.5)
WBC: 5.2 10*3/uL (ref 4.0–10.5)

## 2014-06-22 LAB — URINALYSIS, ROUTINE W REFLEX MICROSCOPIC
GLUCOSE, UA: NEGATIVE mg/dL
Ketones, ur: 40 mg/dL — AB
Nitrite: NEGATIVE
Protein, ur: NEGATIVE mg/dL
Specific Gravity, Urine: 1.019 (ref 1.005–1.030)
UROBILINOGEN UA: 0.2 mg/dL (ref 0.0–1.0)
pH: 5 (ref 5.0–8.0)

## 2014-06-22 LAB — COMPREHENSIVE METABOLIC PANEL
ALBUMIN: 2.9 g/dL — AB (ref 3.5–5.2)
ALT: <5 U/L (ref 0–35)
ANION GAP: 16 — AB (ref 5–15)
AST: 20 U/L (ref 0–37)
Alkaline Phosphatase: 69 U/L (ref 39–117)
BUN: 26 mg/dL — AB (ref 6–23)
CALCIUM: 10.5 mg/dL (ref 8.4–10.5)
CO2: 24 meq/L (ref 19–32)
Chloride: 101 mEq/L (ref 96–112)
Creatinine, Ser: 1.31 mg/dL — ABNORMAL HIGH (ref 0.50–1.10)
GFR calc Af Amer: 39 mL/min — ABNORMAL LOW (ref >90)
GFR, EST NON AFRICAN AMERICAN: 34 mL/min — AB (ref >90)
Glucose, Bld: 83 mg/dL (ref 70–99)
Potassium: 4.6 mEq/L (ref 3.7–5.3)
Sodium: 141 mEq/L (ref 137–147)
Total Bilirubin: 0.3 mg/dL (ref 0.3–1.2)
Total Protein: 8.2 g/dL (ref 6.0–8.3)

## 2014-06-22 LAB — URINE MICROSCOPIC-ADD ON

## 2014-06-22 LAB — I-STAT CG4 LACTIC ACID, ED: Lactic Acid, Venous: 1.78 mmol/L (ref 0.5–2.2)

## 2014-06-22 LAB — TROPONIN I

## 2014-06-22 MED ORDER — SODIUM CHLORIDE 0.9 % IV BOLUS (SEPSIS)
500.0000 mL | Freq: Once | INTRAVENOUS | Status: AC
  Administered 2014-06-22: 500 mL via INTRAVENOUS

## 2014-06-22 MED ORDER — SODIUM CHLORIDE 0.9 % IV SOLN
INTRAVENOUS | Status: DC
  Administered 2014-06-22 (×2): via INTRAVENOUS

## 2014-06-22 MED ORDER — SODIUM CHLORIDE 0.9 % IV BOLUS (SEPSIS)
1000.0000 mL | Freq: Once | INTRAVENOUS | Status: DC
Start: 2014-06-22 — End: 2014-06-22

## 2014-06-22 NOTE — ED Provider Notes (Signed)
CSN: [id number]     Arrival date & time 06/22/14  1642 History   First MD Initiated Contact with Patient 06/22/14 1651     Chief Complaint  Patient presents with  . Dehydration     (Consider location/radiation/quality/duration/timing/severity/associated sxs/prior Treatment) HPI Comments: Patient here from nursing home for possible dehydration. According to nursing home notes and EMS, patient's had decreased oral intake. Patient states that the food at the nursing home is bad which is why she's not eating or drinking. Denies any fever or chills. No vomiting or diarrhea. Denies any chest or abdominal pain. EMS unable to start an IV. Nothing makes her symptoms better worse. No treatment used prior to arrival.  The history is provided by the patient.    Past Medical History  Diagnosis Date  . Medical history unknown   . Hypertension   . Renal disorder    Past Surgical History  Procedure Laterality Date  . Surgical history unknown    . Cardiac surgery      cath  . Foot surgery     History reviewed. No pertinent family history. History  Substance Use Topics  . Smoking status: Unknown If Ever Smoked  . Smokeless tobacco: Not on file  . Alcohol Use: No   OB History   Grav Para Term Preterm Abortions TAB SAB Ect Mult Living                 Review of Systems  All other systems reviewed and are negative.     Allergies  Penicillins  Home Medications   Prior to Admission medications   Medication Sig Start Date End Date Taking? Authorizing Provider  Amino Acids-Protein Hydrolys (FEEDING SUPPLEMENT, PRO-STAT 64,) LIQD Take 30 mLs by mouth 3 (three) times daily with meals.    Historical Provider, MD  ascorbic acid (VITAMIN C) 500 MG tablet Take 1 tablet (500 mg total) by mouth 2 (two) times daily. 11/29/13   Shanker Levora DredgeM Ghimire, MD  feeding supplement, ENSURE COMPLETE, (ENSURE COMPLETE) LIQD Take 237 mLs by mouth 2 (two) times daily between meals. 11/29/13   Shanker Levora DredgeM Ghimire, MD   fentaNYL (DURAGESIC - DOSED MCG/HR) 12 MCG/HR Apply one patch every 72 hours. Remove old patch. Rotate sites 06/17/14   Kimber RelicArthur G Green, MD  LORazepam (ATIVAN) 0.5 MG tablet Take one tablet by mouth twice daily for anxiety 05/14/14   Kimber RelicArthur G Green, MD  Multiple Vitamin (MULTIVITAMIN WITH MINERALS) TABS tablet Take 1 tablet by mouth daily. 11/29/13   Shanker Levora DredgeM Ghimire, MD  nebivolol (BYSTOLIC) 10 MG tablet Take 10 mg by mouth daily.    Historical Provider, MD  oxyCODONE-acetaminophen (PERCOCET/ROXICET) 5-325 MG per tablet Take one to two tablets by mouth every 6 hours as needed for moderate pain or severe pain 05/26/14   Tiffany L Reed, DO  senna-docusate (SENOKOT-S) 8.6-50 MG per tablet Take 2 tablets by mouth at bedtime. 11/29/13   Shanker Levora DredgeM Ghimire, MD   BP 144/66  Pulse 78  Temp(Src) 98.2 F (36.8 C) (Oral)  Resp 14  SpO2 100% Physical Exam  Nursing note and vitals reviewed. Constitutional: She is oriented to person, place, and time. She appears well-developed and well-nourished.  Non-toxic appearance. No distress.  HENT:  Head: Normocephalic and atraumatic.  Dry mucous membranes  Eyes: Conjunctivae, EOM and lids are normal. Pupils are equal, round, and reactive to light.  Neck: Normal range of motion. Neck supple. No tracheal deviation present. No mass present.  Cardiovascular: Normal rate, regular  rhythm and normal heart sounds.  Exam reveals no gallop.   No murmur heard. Pulmonary/Chest: Effort normal and breath sounds normal. No stridor. No respiratory distress. She has no decreased breath sounds. She has no wheezes. She has no rhonchi. She has no rales.  Abdominal: Soft. Normal appearance and bowel sounds are normal. She exhibits no distension. There is no tenderness. There is no rebound and no CVA tenderness.  Musculoskeletal: Normal range of motion. She exhibits no edema and no tenderness.  Neurological: She is alert and oriented to person, place, and time. No cranial nerve deficit or  sensory deficit. GCS eye subscore is 4. GCS verbal subscore is 5. GCS motor subscore is 6.  Skin: Skin is warm and dry. No abrasion and no rash noted.  Psychiatric: Her affect is blunt. Her speech is delayed. She is slowed.    ED Course  Procedures (including critical care time) Labs Review Labs Reviewed  URINE CULTURE  CBC WITH DIFFERENTIAL  COMPREHENSIVE METABOLIC PANEL  URINALYSIS, ROUTINE W REFLEX MICROSCOPIC  TROPONIN I  I-STAT CG4 LACTIC ACID, ED    Imaging Review No results found.   EKG Interpretation None      MDM   Final diagnoses:  None     Date: 06/22/2014  Rate: 78  Rhythm: normal sinus rhythm  QRS Axis: left  Intervals: normal  ST/T Wave abnormalities: normal  Conduction Disutrbances:none  Narrative Interpretation:   Old EKG Reviewed: none available    Patient given IV fluids here and awaiting results of urinalysis. Likely to be discharged once the results.  Toy Baker, MD 06/22/14 [phone]

## 2014-06-22 NOTE — ED Notes (Signed)
Pt to ED via EMS with c/o dehydration and pt more lethargic than normal [facility]. Staff at nursing reports that they are not able obtain labs or insert IV to administer fluid. Per EMS, BP-128/62, pulse-80, RR-18, CBG-121.

## 2014-06-22 NOTE — ED Provider Notes (Signed)
Medical screening examination/treatment/procedure(s) were performed by non-physician practitioner and as supervising physician I was immediately available for consultation/collaboration.   EKG Interpretation None       Juliet RudeNathan R. Rubin PayorPickering, MD 06/22/14 [phone]

## 2014-06-22 NOTE — Discharge Instructions (Signed)
Dehydration Dehydration is when you lose more fluids from the body than you take in. Vital organs such as the kidneys, brain, and heart cannot function without a proper amount of fluids and salt. Any loss of fluids from the body can cause dehydration.  Older adults are at a higher risk of dehydration than younger adults. As we age, our bodies are less able to conserve water and do not respond to temperature changes as well. Also, older adults do not become thirsty as easily or quickly. Because of this, older adults often do not realize they need to increase fluids to avoid dehydration.  CAUSES   Vomiting.  Diarrhea.  Excessive sweating.  Excessive urination.  Fever.  Certain medicines, such as blood pressure medicines called diuretics.  Poorly controlled blood sugars. SIGNS AND SYMPTOMS  Mild dehydration:  Thirst.  Dry lips.  Slightly dry mouth. Moderate dehydration:  Very dry mouth.  Sunken eyes.  Skin does not bounce back quickly when lightly pinched and released.  Dark urine and decreased urine production.  Decreased tear production.  Headache. Severe dehydration:  Very dry mouth.  Extreme thirst.  Rapid, weak pulse (more than 100 beats per minute at rest).  Cold hands and feet.  Not able to sweat in spite of heat.  Rapid breathing.  Blue lips.  Confusion and lethargy.  Difficulty being awakened.  Minimal urine production.  No tears. DIAGNOSIS  Your health care provider will diagnose dehydration based on your symptoms and your exam. Blood and urine tests will help confirm the diagnosis. The diagnostic evaluation should also identify the cause of dehydration. TREATMENT  Treatment of mild or moderate dehydration can often be done at home by increasing the amount of fluids that you drink. It is best to drink small amounts of fluid more often. Drinking too much at one time can make vomiting worse. Severe dehydration needs to be treated at the hospital.  You may be given IV fluids that contain water and electrolytes. HOME CARE INSTRUCTIONS   Ask your health care provider about specific rehydration instructions.  Drink enough fluids to keep your urine clear or pale yellow.  Drink small amounts frequently if you have nausea and vomiting.  Eat as you normally do.  Avoid:  Foods or drinks high in sugar.  Carbonated drinks.  Juice.  Extremely hot or cold fluids.  Drinks with caffeine.  Fatty, greasy foods.  Alcohol.  Tobacco.  Overeating.  Gelatin desserts.  Wash your hands well to avoid spreading bacteria and viruses.  Only take over-the-counter or prescription medicines for pain, discomfort, or fever as directed by your health care provider.  Ask your health care provider if you should continue all prescribed and over-the-counter medicines.  Keep all follow-up appointments with your health care provider. SEEK MEDICAL CARE IF:  You have abdominal pain, and it increases or stays in one area (localizes).  You have a rash, stiff neck, or severe headache.  You are irritable, sleepy, or difficult to awaken.  You are weak, dizzy, or extremely thirsty.  You have a fever. SEEK IMMEDIATE MEDICAL CARE IF:   You are unable to keep fluids down, or you get worse despite treatment.  You have frequent episodes of vomiting or diarrhea.  You have blood or green matter (bile) in your vomit.  You have blood in your stool, or your stool looks black and tarry.  You have not urinated in 6-8 hours, or you have only urinated a small amount of very dark urine.    You faint. MAKE SURE YOU:   Understand these instructions.  Will watch your condition.  Will get help right away if you are not doing well or get worse. Document Released: 01/14/2004 Document Revised: 10/29/2013 Document Reviewed: 07/01/2013 ExitCare Patient Information 2015 [employer]. This information is not intended to replace advice given to you by your  health care provider. Make sure you discuss any questions you have with your health care provider.  

## 2014-06-22 NOTE — ED Provider Notes (Signed)
Lindsay Browning 8:00 PM patient discussed in sign out. Patient with some slight signs of dehydration and given IV fluids. Labs otherwise and concerning. She is afebrile. Normal WBC. UA is still pending. Anticipate that patient may be discharged back to nursing home.   9:20 PM UA shows very small leukocytes with 7-10 WBC however there are many Squamous epithelial. She did not exhibit significant signs concerning for infection. Urine culture has been ordered. At this time will await culture results prior to treatment. She may be discharged home.  [name] [name], PA-C 06/22/14 2123

## 2014-06-22 NOTE — ED Notes (Signed)
I Stat Lactic Acid results shown to Dr. Cordelia PocheA Allen

## 2014-06-24 ENCOUNTER — Non-Acute Institutional Stay (SKILLED_NURSING_FACILITY): Payer: 59 | Admitting: Internal Medicine

## 2014-06-24 DIAGNOSIS — A0472 Enterocolitis due to Clostridium difficile, not specified as recurrent: Secondary | ICD-10-CM

## 2014-06-24 DIAGNOSIS — E860 Dehydration: Secondary | ICD-10-CM

## 2014-06-24 DIAGNOSIS — L89309 Pressure ulcer of unspecified buttock, unspecified stage: Secondary | ICD-10-CM

## 2014-06-24 NOTE — Progress Notes (Signed)
Patient ID: Elwyn Ladesabell C Mitter, female   DOB: 07/02/1919, 80 y.o.   MRN: [id number] [facility] Chief complaint; dehydration, fever, poor oral take History; this is an elderly patient who came to us initially with bilateral buttock wounds. She didn't have found at home in an extreme state of disrepair. She was admitted to [facility] and then ultimately transferred here is no arrangements could be made for her to be cared for in her own home. She has always been resistive to care and frankly has psychotic ideation at some points and is followed by psychiatry. She never allowed physical therapy or occupational therapy to work with her and therefore she really did not rehabilitate.  Over the weekend she apparently had a fever and was felt to be dehydrated. Lab work and IVs were ordered although they could not be obtained are started. [facility] was [facility]. She apparently received an a 1 L of IV fluid at the ER and was returned to the facility. She continues not to do well with oral intake, decreased LOC. Lab work ordered on 8/17 showed a sodium of 146 a creatinine of 1.2. White count is 7.4 hemoglobin 10.7 differential count is essentially normal. The urine for CNS has been ordered and she was started on Rocephin yesterday morning although a culture of the urine from the ER on the weekend was essentially negative  Review of systems; not really possible at this point from the patient. Staff report there is not an ongoing diarrhea  Physical examination Gen. the patient is visibly deteriorated. She is arousable but minimally certainly not at her normal level of mental status/mental clarity HEENT tongue is coated and dry Respiratory clear entry bilaterally Cardiac heart sounds are distant, she appears to be more dehydrated clinically than her lab work would otherwise suggest Abdomen; she is diffusely tender but without guarding or rebound. Bowel sounds are reduced. Large  amount of diarrheal stool is observed GU no suprapubic or obvious costovertebral angle tenderness Skin there is a new wound over her left buttock which has come on in the last 2 weeks. Her original wounds healed after her arrival in the facility. This is a stage II area currently and although it is a worrisome issue I don't believe this is behind her current fever or decline  Impression/plan #1 dehydration which seems worse at the bedside than yesterday's lab work would suggest. I'm going to make another attempt to start IV fluid here #2 questionable UTI. Urine culture from the ER was negative  #3 diffuse abdominal tenderness with a large diarrheal stool which would certainly raise the possibility of pseudomembranous colitis. I think this patient needs to be empirically treated  Patient is visibly declining. 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

## 2014-06-25 NOTE — Progress Notes (Signed)
Pharmacy ED Culture Report Update  CC: dehydration HPI: Tora Ducksabell Fossum is a [age] yof presenting to the ED for possible dehydration.  Patient was treated with IVF.  She was afebrile, normal WBC, and denied fever, chills, vomiting, and diarrhea.  Urine analysis resulted in few bacteria and many squamous epithelial cells, indicating a high probability for a contaminated sample.  The urine culture grew an insignificant amount of proteus mirabilis (30,000 colonies/mL).  The culture's small quantity and contaminated urine analysis does not appear appropriate for further antibiotic treatment.    Thank you.  Red ChristiansSamson Layni Kreamer, Pharm. D. Clinical Pharmacy Resident Pager: [phone] Ph: [phone] 06/25/2014 5:59 PM

## 2014-06-25 NOTE — ED Notes (Signed)
Reviewed by Daylene PoseySanson Lee, RPH (+) urnine culture, followed [facility]. Empirically treated.

## 2014-06-26 ENCOUNTER — Telehealth (HOSPITAL_BASED_OUTPATIENT_CLINIC_OR_DEPARTMENT_OTHER): Payer: Self-pay | Admitting: Emergency Medicine

## 2014-06-26 LAB — URINE CULTURE: Colony Count: 30000

## 2014-06-26 NOTE — Telephone Encounter (Signed)
Post ED Visit - Positive Culture Follow-up  Culture report reviewed by antimicrobial stewardship pharmacist: []  Wes Dulaney, Pharm.D., BCPS []  Celedonio MiyamotoJeremy Frens, [address]Ph[address] BCPS []  Georgina PillionElizabeth Martin, Pharm.D., BCPS []  [place]h Pham, [place]arm.D., BCPS, AAHIVP []  Estella HuskMichelle Turner, Pharm.D., BCPS, AAHIVP [x]  Red ChristiansSamson Lee, Pharm.D. []  Tennis Mustassie Stewart, Pharm.D.  Positive urine culture 30,000 colonies/ml Proteus Mirabilis Treated with none, and no further patient follow-up is required at this time.  Berle MullMiller, Jamaria Amborn 06/26/2014, 10:35 AM

## 2014-06-27 ENCOUNTER — Encounter: Payer: Self-pay | Admitting: Internal Medicine

## 2014-06-28 ENCOUNTER — Telehealth (HOSPITAL_BASED_OUTPATIENT_CLINIC_OR_DEPARTMENT_OTHER): Payer: Self-pay

## 2014-06-28 NOTE — Telephone Encounter (Signed)
Post ED Visit - Positive Culture Follow-up  Culture report reviewed by antimicrobial stewardship pharmacist: []  Wes Dulaney, Pharm.D., BCPS []  Celedonio MiyamotoJeremy Frens, [address] BCPS []  Georgina PillionElizabeth Martin, Pharm.D., BCPS []  GleneagleMinh Pham, [address]Ph[address]., BCPS, AAHIVP []  Estella HuskMichelle Turner, Pharm.D., BCPS, AAHIVP [x]  Red ChristiansSamson Lee, Pharm.D. []  Tennis Mustassie Stewart, Pharm.D.  Positive URNC culture, 30,000 colonies -> proteus mirabilis Treated with No abx tx,  no further patient follow-up is required at this time.  Arvid RightClark, Martha Soltys Dorn 06/28/2014, 9:43 PM

## 2014-07-01 ENCOUNTER — Non-Acute Institutional Stay (SKILLED_NURSING_FACILITY): Payer: 59 | Admitting: Internal Medicine

## 2014-07-01 DIAGNOSIS — L89309 Pressure ulcer of unspecified buttock, unspecified stage: Secondary | ICD-10-CM

## 2014-07-01 DIAGNOSIS — I69959 Hemiplegia and hemiparesis following unspecified cerebrovascular disease affecting unspecified side: Secondary | ICD-10-CM

## 2014-07-01 NOTE — Progress Notes (Shared)
Patient ID: Lindsay Browning, female   DOB: [birth year], 80 y.o.   MRN: [id number]               PROGRESS NOTE  DATE:  06/27/2014    FACILITY: [facility]    LEVEL OF CARE:   SNF   Acute Visit   HISTORY OF PRESENT ILLNESS:  I saw Lindsay Browning earlier in the week.  She seemed to be having significant abdominal pain and tenderness at the time.  There was loose incontinent stool.  I ordered stool for C.diff.  However, this returned negative.  I had started her on oral vancomycin.  However, I stopped this after the result of the toxin assay became known.  We gave her IV fluid.    REVIEW OF SYSTEMS:   GENERAL APPEARANCE:  The patient's intake is  apparently poor, although she does better with nutritional supplements.   GI:  She has not had any further diarrheal stools.     PHYSICAL EXAMINATION:   GENERAL APPEARANCE:  The patient looks a little more comfortable and more alert than when I saw her the other day.   GASTROINTESTINAL:  ABDOMEN:   No tenderness is noted.  No masses.   CARDIOVASCULAR:  CARDIAC:  She does not appear to be dehydrated.  Heart sounds are normal.    ASSESSMENT/PLAN:  Decline.  I am not exactly sure what is driving this.  She does have very significant dementia with psychosis.  She does not have a UTI.  She does not have pseudomembranous colitis.  However, she appears to have declined in general and she is not eating well at all.  This may be the forerunner of a preterminal situation.  I will try to check on her tomorrow.    Dementia with very significant psychosis.  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  She is on a small dose of Seroquel 50 mg at bedtime.    Severe chronic pain, I felt secondary to severe widespread erosive osteoarthritis.  She is on a small dose of Fentanyl, which I think is reasonable.  I have no plans to alter this.    CPT CODE: 40981

## 2014-07-07 NOTE — Progress Notes (Addendum)
Patient ID: Lindsay Browning, female   DOB: 29-Jun-1919, 80 y.o.   MRN: [id number]               PROGRESS NOTE  DATE:  07/01/2014    FACILITY: [facility]    LEVEL OF CARE:   SNF   Acute Visit   CHIEF COMPLAINT:  Continued clinical follow-up of clinical decline.    HISTORY OF PRESENT ILLNESS:  This is a patient who, I believe, came to the building in late January 2015.  She was admitted from home where she was found to be immobilized and covered in urine and stool.    She was admitted here with fairly significant decubitus ulcers on her buttocks, also on her coccyx.  The patient was very restless, combative, and almost impossible to examine.  She was felt to have depression with severe anxiety almost to the point that I felt she was probably an undiagnosed bipolar patient.    In any case, she would not cooperate with therapy, would not accept her need to be here.  She has family who I believe are out of state and she apparently has a Armed forces operational officer of [employer] as her guardian.    Over the last week to two, she has not done well.  She was in the emergency room at one point.  Felt to have a UTI, although the culture was negative.    When  I saw her last time on 06/23/2014, I thought she was dehydrated.  I also wondered about pseudomembranous colitis based on a large liquid stool, though that study actually was negative, as well.    She has continued really to decline, eating and drinking poorly.  I am seeing her today in follow-up.    PHYSICAL EXAMINATION:   VITAL SIGNS:   O2 SATURATIONS:  93% on room air.   RESPIRATIONS:  20.   PULSE:  83.   GENERAL APPEARANCE:  The patient has the same restless, anxious look.  The most notable thing is that her speech is almost incomprehensible.   CHEST/RESPIRATORY:  Shallow, but otherwise clear air entry.    CARDIOVASCULAR:  CARDIAC:   Heart sounds are normal.  There are no murmurs.    GASTROINTESTINAL:  LIVER/SPLEEN/KIDNEYS:   No liver, no spleen.  No tenderness.   GENITOURINARY:  BLADDER:   No clear suprapubic or costovertebral angle tenderness.    MUSCULOSKELETAL:   EXTREMITIES:   BILATERAL LOWER EXTREMITIES:  Her legs are contracted at the hips and knees.   NEUROLOGICAL:   It is a bit difficult to be certain.  I wonder if this lady has a cortically based language disturbance.  She appears to talk, but nothing comes out that is comprehensible.  The other finding is an apparent right upper extremity weakness, although it is difficult to really define this exactly.    ASSESSMENT/PLAN:  ?Dominant hemisphere stroke.  I am not going to confirm my suspicions with neuroimaging.  She is a DNR.  I think she should largely be managed as comfort care in the facility.  I will initiate contact with her guardian

## 2014-07-11 ENCOUNTER — Encounter (HOSPITAL_COMMUNITY): Payer: Self-pay | Admitting: Emergency Medicine

## 2014-07-11 ENCOUNTER — Inpatient Hospital Stay (HOSPITAL_COMMUNITY)
Admission: EM | Admit: 2014-07-11 | Discharge: 2014-07-24 | DRG: 640 | Disposition: A | Discharge status: Skilled Nursing Facility | Payer: Medicare Other | Attending: Internal Medicine | Admitting: Internal Medicine

## 2014-07-11 DIAGNOSIS — Z66 Do not resuscitate: Secondary | ICD-10-CM | POA: Diagnosis present

## 2014-07-11 DIAGNOSIS — R34 Anuria and oliguria: Secondary | ICD-10-CM | POA: Diagnosis present

## 2014-07-11 DIAGNOSIS — E860 Dehydration: Secondary | ICD-10-CM

## 2014-07-11 DIAGNOSIS — L8995 Pressure ulcer of unspecified site, unstageable: Secondary | ICD-10-CM | POA: Diagnosis present

## 2014-07-11 DIAGNOSIS — E43 Unspecified severe protein-calorie malnutrition: Secondary | ICD-10-CM

## 2014-07-11 DIAGNOSIS — E039 Hypothyroidism, unspecified: Secondary | ICD-10-CM

## 2014-07-11 DIAGNOSIS — N183 Chronic kidney disease, stage 3 unspecified: Secondary | ICD-10-CM | POA: Diagnosis present

## 2014-07-11 DIAGNOSIS — R32 Unspecified urinary incontinence: Secondary | ICD-10-CM | POA: Diagnosis present

## 2014-07-11 DIAGNOSIS — G894 Chronic pain syndrome: Secondary | ICD-10-CM

## 2014-07-11 DIAGNOSIS — N170 Acute kidney failure with tubular necrosis: Secondary | ICD-10-CM | POA: Diagnosis present

## 2014-07-11 DIAGNOSIS — I959 Hypotension, unspecified: Secondary | ICD-10-CM | POA: Diagnosis present

## 2014-07-11 DIAGNOSIS — N189 Chronic kidney disease, unspecified: Secondary | ICD-10-CM

## 2014-07-11 DIAGNOSIS — D649 Anemia, unspecified: Secondary | ICD-10-CM | POA: Diagnosis present

## 2014-07-11 DIAGNOSIS — L89309 Pressure ulcer of unspecified buttock, unspecified stage: Secondary | ICD-10-CM | POA: Diagnosis present

## 2014-07-11 DIAGNOSIS — L8990 Pressure ulcer of unspecified site, unspecified stage: Secondary | ICD-10-CM

## 2014-07-11 DIAGNOSIS — E870 Hyperosmolality and hypernatremia: Secondary | ICD-10-CM | POA: Diagnosis not present

## 2014-07-11 DIAGNOSIS — R634 Abnormal weight loss: Secondary | ICD-10-CM | POA: Diagnosis present

## 2014-07-11 DIAGNOSIS — F0390 Unspecified dementia without behavioral disturbance: Secondary | ICD-10-CM | POA: Diagnosis present

## 2014-07-11 DIAGNOSIS — R5381 Other malaise: Secondary | ICD-10-CM | POA: Diagnosis present

## 2014-07-11 DIAGNOSIS — R1310 Dysphagia, unspecified: Secondary | ICD-10-CM

## 2014-07-11 DIAGNOSIS — Z515 Encounter for palliative care: Secondary | ICD-10-CM

## 2014-07-11 DIAGNOSIS — L89153 Pressure ulcer of sacral region, stage 3: Secondary | ICD-10-CM

## 2014-07-11 DIAGNOSIS — I129 Hypertensive chronic kidney disease with stage 1 through stage 4 chronic kidney disease, or unspecified chronic kidney disease: Secondary | ICD-10-CM | POA: Diagnosis present

## 2014-07-11 DIAGNOSIS — L8993 Pressure ulcer of unspecified site, stage 3: Secondary | ICD-10-CM | POA: Diagnosis present

## 2014-07-11 DIAGNOSIS — I10 Essential (primary) hypertension: Secondary | ICD-10-CM

## 2014-07-11 DIAGNOSIS — L89609 Pressure ulcer of unspecified heel, unspecified stage: Secondary | ICD-10-CM | POA: Diagnosis present

## 2014-07-11 DIAGNOSIS — R627 Adult failure to thrive: Secondary | ICD-10-CM | POA: Diagnosis present

## 2014-07-11 DIAGNOSIS — E875 Hyperkalemia: Secondary | ICD-10-CM

## 2014-07-11 DIAGNOSIS — L89109 Pressure ulcer of unspecified part of back, unspecified stage: Secondary | ICD-10-CM | POA: Diagnosis present

## 2014-07-11 DIAGNOSIS — N179 Acute kidney failure, unspecified: Secondary | ICD-10-CM

## 2014-07-11 MED ORDER — SODIUM CHLORIDE 0.9 % IV BOLUS (SEPSIS)
500.0000 mL | Freq: Once | INTRAVENOUS | Status: AC
  Administered 2014-07-12: 500 mL via INTRAVENOUS

## 2014-07-11 NOTE — ED Notes (Signed)
Per EMS, pt. Is from [facility] who was reported of generalized weakness for the past few days per next of kin, pt. Is DNR,  no SOB, flat affect , non verbal. Nurse at facility said that pt.'s weakness has been noted for weeks now.

## 2014-07-11 NOTE — ED Notes (Signed)
PA at bedside.

## 2014-07-11 NOTE — ED Provider Notes (Signed)
CSN: [id number]     Arrival date & time 07/11/14  2253 History   First MD Initiated Contact with Patient 07/11/14 2327     Chief Complaint  Patient presents with  . generalized weakness    HPI  History provided by the patient's power of attorney. Patient is a 80 year old female with history of hypertension, renal disorders, and dementia presenting with concerns for increasing weakness and weight loss. Patient is a resident at [facility] where she has been noticed to have increasing general weakness. Patient is DO NOT RESUSCITATE. She is nonverbal. There is some concern that she may have lost some significant weight over the past few weeks. Staff have been trying to feed supplemental shakes but is uncertain, she drinks. Care provider is concerned with her care at current nursing facility and they wish to relocate her. There has been no documented fevers. No significant changes in her vital signs.     Past Medical History  Diagnosis Date  . Medical history unknown   . Hypertension   . Renal disorder    Past Surgical History  Procedure Laterality Date  . Surgical history unknown    . Cardiac surgery      cath  . Foot surgery     History reviewed. No pertinent family history. History  Substance Use Topics  . Smoking status: Unknown If Ever Smoked  . Smokeless tobacco: Not on file  . Alcohol Use: No   OB History   Grav Para Term Preterm Abortions TAB SAB Ect Mult Living                 Review of Systems  Constitutional: Positive for appetite change and unexpected weight change. Negative for fever.  Neurological: Positive for weakness.  All other systems reviewed and are negative.     Allergies  Penicillins  Home Medications   Prior to Admission medications   Medication Sig Start Date End Date Taking? Authorizing Provider  ascorbic acid (VITAMIN C) 500 MG tablet Take 1 tablet (500 mg total) by mouth 2 (two) times daily. 11/29/13  Yes Shanker Levora Dredge, MD  ergocalciferol  (VITAMIN D2) 50000 UNITS capsule Take 50,000 Units by mouth every 30 (thirty) days.   Yes Historical Provider, MD  fentaNYL (DURAGESIC - DOSED MCG/HR) 12 MCG/HR Place 12.5 mcg onto the skin every 3 (three) days.   Yes Historical Provider, MD  FLUoxetine (PROZAC) 10 MG capsule Take 10 mg by mouth daily.   Yes Historical Provider, MD  LORazepam (ATIVAN) 0.5 MG tablet Take 0.5 mg by mouth 2 (two) times daily.   Yes Historical Provider, MD  Multiple Vitamins-Minerals (CERTA-VITE PO) Take 1 tablet by mouth daily.    Yes Historical Provider, MD  nebivolol (BYSTOLIC) 10 MG tablet Take 10 mg by mouth daily.   Yes Historical Provider, MD  QUEtiapine (SEROQUEL) 25 MG tablet Take 25 mg by mouth 2 (two) times daily.    Yes Historical Provider, MD  senna-docusate (SENOKOT-S) 8.6-50 MG per tablet Take 2 tablets by mouth at bedtime. 11/29/13  Yes Shanker Levora Dredge, MD  lidocaine (LIDODERM) 5 % Place 1 patch onto the skin daily. Apply to left knee, Remove & Discard patch within 12 hours or as directed by MD    Historical Provider, MD  NONFORMULARY OR COMPOUNDED ITEM Apply 1 mL topically 2 (two) times daily as needed (for anxiety or agitation). Lorazepam /1ML    Historical Provider, MD  oxyCODONE-acetaminophen (PERCOCET/ROXICET) 5-325 MG per tablet Take 1-2 tablets by  mouth every 6 (six) hours as needed for severe pain.    Historical Provider, MD   BP 100/54  Pulse 84  Resp 16  SpO2 95% Physical Exam  Nursing note and vitals reviewed. Constitutional: She appears well-developed and well-nourished. No distress.  HENT:  Head: Normocephalic.  Mucosa dry  Cardiovascular: Normal rate and regular rhythm.   Pulmonary/Chest: Effort normal and breath sounds normal.  Abdominal: Soft. She exhibits no distension. There is tenderness.  Neurological: She is alert.  Patient awake, nonverbal  Skin: Skin is warm and dry. No rash noted.  Stage 1-2 pressure ulcers over the sacral area and buttocks.  Psychiatric: She has a  normal mood and affect. Her behavior is normal.    ED Course  Procedures   COORDINATION OF CARE:  Nursing notes reviewed. Vital signs reviewed. Initial pt interview and examination performed.   Filed Vitals:   07/11/14 2255 07/11/14 2307  BP:  100/54  Pulse:  84  Resp:  16  SpO2: 95%    Rectal temperature 99.0 Fahrenheit   11:47 PM-patient seen and evaluated. Patient is nonverbal. Does grimace with palpation over the suprapubic area and mid abdomen. Abdomen soft otherwise. Patient does appear slightly dehydrated. Questionable weight loss.  4:20AM-with stress screwless. They will see patient for dehydration.  Treatment plan initiated: Medications  sodium chloride 0.9 % bolus 500 mL (500 mLs Intravenous New Bag/Given 07/12/14 0108)  sodium chloride 0.9 % bolus 1,000 mL (0 mLs Intravenous Stopped 07/12/14 0305)   Results for orders placed during the hospital encounter of 07/11/14  CBC      Result Value Ref Range   WBC 12.2 (*) 4.0 - 10.5 K/uL   RBC 3.72 (*) 3.87 - 5.11 MIL/uL   Hemoglobin 10.9 (*) 12.0 - 15.0 g/dL   HCT 21.3  08.6 - 57.8 %   MCV 97.0  78.0 - 100.0 fL   MCH 29.3  26.0 - 34.0 pg   MCHC 30.2  30.0 - 36.0 g/dL   RDW 46.9  62.9 - 52.8 %   Platelets 256  150 - 400 K/uL  BASIC METABOLIC PANEL      Result Value Ref Range   Sodium 157 (*) 137 - 147 mEq/L   Potassium 4.7  3.7 - 5.3 mEq/L   Chloride 118 (*) 96 - 112 mEq/L   CO2 23  19 - 32 mEq/L   Glucose, Bld 134 (*) 70 - 99 mg/dL   BUN 413 (*) 6 - 23 mg/dL   Creatinine, Ser 2.44 (*) 0.50 - 1.10 mg/dL   Calcium 9.8  8.4 - 01.0 mg/dL   GFR calc non Af Amer 9 (*) >90 mL/min   GFR calc Af Amer 11 (*) >90 mL/min   Anion gap 16 (*) 5 - 15  URINALYSIS, ROUTINE W REFLEX MICROSCOPIC      Result Value Ref Range   Color, Urine YELLOW  YELLOW   APPearance CLOUDY (*) CLEAR   Specific Gravity, Urine 1.021  1.005 - 1.030   pH 5.0  5.0 - 8.0   Glucose, UA NEGATIVE  NEGATIVE mg/dL   Hgb urine dipstick TRACE (*) NEGATIVE    Bilirubin Urine NEGATIVE  NEGATIVE   Ketones, ur NEGATIVE  NEGATIVE mg/dL   Protein, ur NEGATIVE  NEGATIVE mg/dL   Urobilinogen, UA 0.2  0.0 - 1.0 mg/dL   Nitrite NEGATIVE  NEGATIVE   Leukocytes, UA SMALL (*) NEGATIVE  DIFFERENTIAL      Result Value Ref Range   Neutrophils  Relative % 77  43 - 77 %   Neutro Abs 8.7 (*) 1.7 - 7.7 K/uL   Lymphocytes Relative 13  12 - 46 %   Lymphs Abs 1.5  0.7 - 4.0 K/uL   Monocytes Relative 9  3 - 12 %   Monocytes Absolute 1.0  0.1 - 1.0 K/uL   Eosinophils Relative 1  0 - 5 %   Eosinophils Absolute 0.1  0.0 - 0.7 K/uL   Basophils Relative 0  0 - 1 %   Basophils Absolute 0.1  0.0 - 0.1 K/uL  URINE MICROSCOPIC-ADD ON      Result Value Ref Range   Squamous Epithelial / LPF RARE  RARE   WBC, UA 0-2  <3 WBC/hpf   RBC / HPF 0-2  <3 RBC/hpf   Urine-Other AMORPHOUS URATES/PHOSPHATES         EKG Interpretation   Date/Time:  Friday July 11 2014 23:43:10 EDT Ventricular Rate:  82 PR Interval:  169 QRS Duration: 82 QT Interval:  417 QTC Calculation: 487 R Axis:   -22 Text Interpretation:  Sinus rhythm Borderline left axis deviation Low  voltage, precordial leads Borderline prolonged QT interval Confirmed by  Malva Cogan  MD, DOUGLAS ([id number]) on 07/12/2014 12:13:51 AM      MDM   Final diagnoses:  Dehydration     Angus Seller, PA-C 07/12/14 [phone]

## 2014-07-11 NOTE — ED Notes (Signed)
Bed: ZO10 Expected date:  Expected time:  Means of arrival:  Comments: EMS/elderly/weakness

## 2014-07-11 NOTE — ED Notes (Signed)
Could not obtain labs, RN aware.

## 2014-07-12 ENCOUNTER — Encounter (HOSPITAL_COMMUNITY): Payer: Self-pay | Admitting: Internal Medicine

## 2014-07-12 ENCOUNTER — Inpatient Hospital Stay (HOSPITAL_COMMUNITY): Payer: Medicare Other

## 2014-07-12 DIAGNOSIS — R32 Unspecified urinary incontinence: Secondary | ICD-10-CM | POA: Diagnosis present

## 2014-07-12 DIAGNOSIS — E875 Hyperkalemia: Secondary | ICD-10-CM | POA: Diagnosis not present

## 2014-07-12 DIAGNOSIS — R627 Adult failure to thrive: Secondary | ICD-10-CM | POA: Diagnosis present

## 2014-07-12 DIAGNOSIS — L89109 Pressure ulcer of unspecified part of back, unspecified stage: Secondary | ICD-10-CM | POA: Diagnosis present

## 2014-07-12 DIAGNOSIS — E039 Hypothyroidism, unspecified: Secondary | ICD-10-CM | POA: Diagnosis present

## 2014-07-12 DIAGNOSIS — N179 Acute kidney failure, unspecified: Secondary | ICD-10-CM | POA: Diagnosis present

## 2014-07-12 DIAGNOSIS — D649 Anemia, unspecified: Secondary | ICD-10-CM | POA: Diagnosis present

## 2014-07-12 DIAGNOSIS — E870 Hyperosmolality and hypernatremia: Secondary | ICD-10-CM | POA: Insufficient documentation

## 2014-07-12 DIAGNOSIS — R34 Anuria and oliguria: Secondary | ICD-10-CM | POA: Diagnosis present

## 2014-07-12 DIAGNOSIS — L89609 Pressure ulcer of unspecified heel, unspecified stage: Secondary | ICD-10-CM | POA: Diagnosis present

## 2014-07-12 DIAGNOSIS — L89309 Pressure ulcer of unspecified buttock, unspecified stage: Secondary | ICD-10-CM | POA: Diagnosis present

## 2014-07-12 DIAGNOSIS — R1310 Dysphagia, unspecified: Secondary | ICD-10-CM | POA: Diagnosis present

## 2014-07-12 DIAGNOSIS — E43 Unspecified severe protein-calorie malnutrition: Secondary | ICD-10-CM | POA: Diagnosis present

## 2014-07-12 DIAGNOSIS — N170 Acute kidney failure with tubular necrosis: Secondary | ICD-10-CM | POA: Diagnosis present

## 2014-07-12 DIAGNOSIS — F0390 Unspecified dementia without behavioral disturbance: Secondary | ICD-10-CM | POA: Diagnosis present

## 2014-07-12 DIAGNOSIS — N183 Chronic kidney disease, stage 3 unspecified: Secondary | ICD-10-CM | POA: Diagnosis present

## 2014-07-12 DIAGNOSIS — L8990 Pressure ulcer of unspecified site, unspecified stage: Secondary | ICD-10-CM

## 2014-07-12 DIAGNOSIS — Z66 Do not resuscitate: Secondary | ICD-10-CM | POA: Diagnosis present

## 2014-07-12 DIAGNOSIS — N189 Chronic kidney disease, unspecified: Secondary | ICD-10-CM

## 2014-07-12 DIAGNOSIS — E860 Dehydration: Secondary | ICD-10-CM | POA: Diagnosis present

## 2014-07-12 DIAGNOSIS — L8995 Pressure ulcer of unspecified site, unstageable: Secondary | ICD-10-CM | POA: Diagnosis present

## 2014-07-12 DIAGNOSIS — R634 Abnormal weight loss: Secondary | ICD-10-CM | POA: Diagnosis present

## 2014-07-12 DIAGNOSIS — R5381 Other malaise: Secondary | ICD-10-CM | POA: Diagnosis present

## 2014-07-12 DIAGNOSIS — I129 Hypertensive chronic kidney disease with stage 1 through stage 4 chronic kidney disease, or unspecified chronic kidney disease: Secondary | ICD-10-CM | POA: Diagnosis present

## 2014-07-12 DIAGNOSIS — L8993 Pressure ulcer of unspecified site, stage 3: Secondary | ICD-10-CM | POA: Diagnosis present

## 2014-07-12 DIAGNOSIS — I959 Hypotension, unspecified: Secondary | ICD-10-CM | POA: Diagnosis present

## 2014-07-12 LAB — BASIC METABOLIC PANEL
Anion gap: 16 — ABNORMAL HIGH (ref 5–15)
Anion gap: 17 — ABNORMAL HIGH (ref 5–15)
BUN: 101 mg/dL — ABNORMAL HIGH (ref 6–23)
BUN: 102 mg/dL — ABNORMAL HIGH (ref 6–23)
CALCIUM: 9.8 mg/dL (ref 8.4–10.5)
CO2: 23 mEq/L (ref 19–32)
CO2: 22 meq/L (ref 19–32)
Calcium: 9 mg/dL (ref 8.4–10.5)
Chloride: 118 mEq/L — ABNORMAL HIGH (ref 96–112)
Chloride: 122 mEq/L — ABNORMAL HIGH (ref 96–112)
Creatinine, Ser: 3.78 mg/dL — ABNORMAL HIGH (ref 0.50–1.10)
Creatinine, Ser: 3.64 mg/dL — ABNORMAL HIGH (ref 0.50–1.10)
GFR calc Af Amer: 11 mL/min — ABNORMAL LOW (ref >90)
GFR calc Af Amer: 11 mL/min — ABNORMAL LOW (ref >90)
GFR calc non Af Amer: 9 mL/min — ABNORMAL LOW (ref >90)
GFR calc non Af Amer: 10 mL/min — ABNORMAL LOW (ref >90)
GLUCOSE: 134 mg/dL — AB (ref 70–99)
Glucose, Bld: 157 mg/dL — ABNORMAL HIGH (ref 70–99)
Potassium: 4.7 mEq/L (ref 3.7–5.3)
Potassium: 4.7 mEq/L (ref 3.7–5.3)
Sodium: 157 mEq/L — ABNORMAL HIGH (ref 137–147)
Sodium: 161 mEq/L — ABNORMAL HIGH (ref 137–147)

## 2014-07-12 LAB — DIFFERENTIAL
Basophils Absolute: 0.1 10*3/uL (ref 0.0–0.1)
Basophils Relative: 0 % (ref 0–1)
EOS PCT: 1 % (ref 0–5)
Eosinophils Absolute: 0.1 10*3/uL (ref 0.0–0.7)
LYMPHS ABS: 1.5 10*3/uL (ref 0.7–4.0)
LYMPHS PCT: 13 % (ref 12–46)
MONO ABS: 1 10*3/uL (ref 0.1–1.0)
Monocytes Relative: 9 % (ref 3–12)
Neutro Abs: 8.7 10*3/uL — ABNORMAL HIGH (ref 1.7–7.7)
Neutrophils Relative %: 77 % (ref 43–77)

## 2014-07-12 LAB — CBC
HCT: 36.1 % (ref 36.0–46.0)
Hemoglobin: 10.9 g/dL — ABNORMAL LOW (ref 12.0–15.0)
MCH: 29.3 pg (ref 26.0–34.0)
MCHC: 30.2 g/dL (ref 30.0–36.0)
MCV: 97 fL (ref 78.0–100.0)
PLATELETS: 256 10*3/uL (ref 150–400)
RBC: 3.72 MIL/uL — ABNORMAL LOW (ref 3.87–5.11)
RDW: 14.6 % (ref 11.5–15.5)
WBC: 12.2 10*3/uL — ABNORMAL HIGH (ref 4.0–10.5)

## 2014-07-12 LAB — URINALYSIS, ROUTINE W REFLEX MICROSCOPIC
BILIRUBIN URINE: NEGATIVE
GLUCOSE, UA: NEGATIVE mg/dL
KETONES UR: NEGATIVE mg/dL
Nitrite: NEGATIVE
PH: 5 (ref 5.0–8.0)
Protein, ur: NEGATIVE mg/dL
Specific Gravity, Urine: 1.021 (ref 1.005–1.030)
Urobilinogen, UA: 0.2 mg/dL (ref 0.0–1.0)

## 2014-07-12 LAB — COMPREHENSIVE METABOLIC PANEL
ALT: 11 U/L (ref 0–35)
AST: 42 U/L — AB (ref 0–37)
Albumin: 1.8 g/dL — ABNORMAL LOW (ref 3.5–5.2)
Alkaline Phosphatase: 69 U/L (ref 39–117)
Anion gap: 18 — ABNORMAL HIGH (ref 5–15)
BUN: 102 mg/dL — ABNORMAL HIGH (ref 6–23)
CALCIUM: 8.8 mg/dL (ref 8.4–10.5)
CO2: 19 meq/L (ref 19–32)
CREATININE: 3.6 mg/dL — AB (ref 0.50–1.10)
Chloride: 120 mEq/L — ABNORMAL HIGH (ref 96–112)
GFR calc non Af Amer: 10 mL/min — ABNORMAL LOW (ref >90)
GFR, EST AFRICAN AMERICAN: 12 mL/min — AB (ref >90)
Glucose, Bld: 123 mg/dL — ABNORMAL HIGH (ref 70–99)
Potassium: 5.5 mEq/L — ABNORMAL HIGH (ref 3.7–5.3)
Sodium: 157 mEq/L — ABNORMAL HIGH (ref 137–147)
Total Bilirubin: <0.2 mg/dL — ABNORMAL LOW (ref 0.3–1.2)
Total Protein: 7.4 g/dL (ref 6.0–8.3)

## 2014-07-12 LAB — PHOSPHORUS: Phosphorus: 5.1 mg/dL — ABNORMAL HIGH (ref 2.3–4.6)

## 2014-07-12 LAB — MAGNESIUM: Magnesium: 2.9 mg/dL — ABNORMAL HIGH (ref 1.5–2.5)

## 2014-07-12 LAB — URINE MICROSCOPIC-ADD ON

## 2014-07-12 LAB — CREATININE, URINE, RANDOM: CREATININE, URINE: 211.62 mg/dL

## 2014-07-12 LAB — SODIUM, URINE, RANDOM: Sodium, Ur: <20 mEq/L

## 2014-07-12 LAB — MRSA PCR SCREENING: MRSA by PCR: POSITIVE — AB

## 2014-07-12 MED ORDER — FLUOXETINE HCL 10 MG PO CAPS
10.0000 mg | ORAL_CAPSULE | Freq: Every day | ORAL | Status: DC
  Administered 2014-07-12 – 2014-07-13 (×2): 10 mg via ORAL
  Filled 2014-07-12 (×3): qty 1

## 2014-07-12 MED ORDER — SODIUM CHLORIDE 0.9 % IV BOLUS (SEPSIS)
1000.0000 mL | Freq: Once | INTRAVENOUS | Status: AC
  Administered 2014-07-12: 1000 mL via INTRAVENOUS

## 2014-07-12 MED ORDER — DEXTROSE-NACL 5-0.45 % IV SOLN
INTRAVENOUS | Status: DC
  Administered 2014-07-12: 100 mL/h via INTRAVENOUS

## 2014-07-12 MED ORDER — SODIUM CHLORIDE 0.9 % IJ SOLN
3.0000 mL | Freq: Two times a day (BID) | INTRAMUSCULAR | Status: DC
  Administered 2014-07-16: 3 mL via INTRAVENOUS

## 2014-07-12 MED ORDER — ACETAMINOPHEN 325 MG PO TABS: 650.0000 mg | ORAL_TABLET | Freq: Four times a day (QID) | ORAL | Status: DC | PRN

## 2014-07-12 MED ORDER — ACETAMINOPHEN 650 MG RE SUPP: 650.0000 mg | Freq: Four times a day (QID) | RECTAL | Status: DC | PRN

## 2014-07-12 MED ORDER — LORAZEPAM 0.5 MG PO TABS
0.5000 mg | ORAL_TABLET | Freq: Two times a day (BID) | ORAL | Status: DC
  Administered 2014-07-12 – 2014-07-13 (×3): 0.5 mg via ORAL
  Filled 2014-07-12 (×3): qty 1

## 2014-07-12 MED ORDER — OXYCODONE-ACETAMINOPHEN 5-325 MG PO TABS: 1.0000 | ORAL_TABLET | Freq: Four times a day (QID) | ORAL | Status: DC | PRN

## 2014-07-12 MED ORDER — HYDROCODONE-ACETAMINOPHEN 5-325 MG PO TABS: 1.0000 | ORAL_TABLET | ORAL | Status: DC | PRN

## 2014-07-12 MED ORDER — ONDANSETRON HCL 4 MG PO TABS
4.0000 mg | ORAL_TABLET | Freq: Four times a day (QID) | ORAL | Status: DC | PRN
Start: 2014-07-12 — End: 2014-07-14

## 2014-07-12 MED ORDER — SODIUM POLYSTYRENE SULFONATE 15 GM/60ML PO SUSP
15.0000 g | Freq: Once | ORAL | Status: AC
  Administered 2014-07-12: 15 g via RECTAL
  Filled 2014-07-12: qty 60

## 2014-07-12 MED ORDER — DOCUSATE SODIUM 100 MG PO CAPS
100.0000 mg | ORAL_CAPSULE | Freq: Two times a day (BID) | ORAL | Status: DC
  Administered 2014-07-12: 100 mg via ORAL
  Filled 2014-07-12 (×2): qty 1

## 2014-07-12 MED ORDER — PNEUMOCOCCAL VAC POLYVALENT 25 MCG/0.5ML IJ INJ
0.5000 mL | INJECTION | INTRAMUSCULAR | Status: AC
Start: 2014-07-13 — End: 2014-07-13
  Administered 2014-07-13: 0.5 mL via INTRAMUSCULAR
  Filled 2014-07-12 (×2): qty 0.5

## 2014-07-12 MED ORDER — BISACODYL 10 MG RE SUPP: 10.0000 mg | Freq: Every day | RECTAL | Status: DC | PRN

## 2014-07-12 MED ORDER — DEXTROSE 5 % IV SOLN
INTRAVENOUS | Status: DC
  Administered 2014-07-12: 125 mL via INTRAVENOUS
  Administered 2014-07-13 (×2): via INTRAVENOUS

## 2014-07-12 MED ORDER — ENOXAPARIN SODIUM 40 MG/0.4ML ~~LOC~~ SOLN
40.0000 mg | SUBCUTANEOUS | Status: DC
  Filled 2014-07-12: qty 0.4

## 2014-07-12 MED ORDER — FLEET ENEMA 7-19 GM/118ML RE ENEM: 1.0000 | ENEMA | Freq: Once | RECTAL | Status: DC | PRN

## 2014-07-12 MED ORDER — ENOXAPARIN SODIUM 30 MG/0.3ML ~~LOC~~ SOLN
30.0000 mg | SUBCUTANEOUS | Status: DC
  Administered 2014-07-13 – 2014-07-21 (×9): 30 mg via SUBCUTANEOUS
  Filled 2014-07-12 (×10): qty 0.3

## 2014-07-12 MED ORDER — QUETIAPINE FUMARATE 25 MG PO TABS
25.0000 mg | ORAL_TABLET | Freq: Two times a day (BID) | ORAL | Status: DC
  Administered 2014-07-12 – 2014-07-13 (×3): 25 mg via ORAL
  Filled 2014-07-12 (×6): qty 1

## 2014-07-12 MED ORDER — ONDANSETRON HCL 4 MG/2ML IJ SOLN: 4.0000 mg | Freq: Four times a day (QID) | INTRAMUSCULAR | Status: DC | PRN

## 2014-07-12 MED ORDER — SENNOSIDES-DOCUSATE SODIUM 8.6-50 MG PO TABS
2.0000 | ORAL_TABLET | Freq: Every day | ORAL | Status: DC
  Filled 2014-07-12 (×2): qty 2

## 2014-07-12 MED ORDER — LIDOCAINE HCL 2 % IJ SOLN
INTRAMUSCULAR | Status: AC
  Administered 2014-07-12: 08:00:00
  Filled 2014-07-12: qty 20

## 2014-07-12 MED ORDER — LIDOCAINE HCL 1 % IJ SOLN
INTRAMUSCULAR | Status: AC
  Filled 2014-07-12: qty 20

## 2014-07-12 MED ORDER — FENTANYL 12 MCG/HR TD PT72
12.5000 ug | MEDICATED_PATCH | TRANSDERMAL | Status: DC
  Administered 2014-07-14 – 2014-07-23 (×4): 12.5 ug via TRANSDERMAL
  Filled 2014-07-12 (×4): qty 1

## 2014-07-12 MED ORDER — POLYETHYLENE GLYCOL 3350 17 G PO PACK
17.0000 g | PACK | Freq: Every day | ORAL | Status: DC | PRN
  Filled 2014-07-12: qty 1

## 2014-07-12 MED ORDER — LIDOCAINE HCL 1 % IJ SOLN: 30.0000 mL | Freq: Once | INTRAMUSCULAR | Status: DC

## 2014-07-12 MED ORDER — SODIUM CHLORIDE 0.9 % IJ SOLN
10.0000 mL | INTRAMUSCULAR | Status: DC | PRN
  Administered 2014-07-13 – 2014-07-14 (×2): 10 mL
  Administered 2014-07-15: 20 mL
  Administered 2014-07-15 – 2014-07-24 (×4): 10 mL

## 2014-07-12 NOTE — Progress Notes (Signed)
Pt holding food in mouth had to swab food out. Speech Therapy in to see.

## 2014-07-12 NOTE — Progress Notes (Addendum)
PROGRESS NOTE    Lindsay Browning RUE:[id number] DOB: 1919-02-23 DOA: 07/11/2014 PCP: Florentina Jenny, MD  HPI/Brief narrative 80 year old female patient, SNF resident, has DSS guardian, likely advanced dementia & nonverbal, hypertension, chronic kidney disease, sacral and right foot decubitus ulcers, transferred from nursing home secondary to progressive decrease in functioning, fatigue, weight loss, not eating. In the ED, found to be hypernatremic with sodium 157 and acute renal failure with creatinine 3.78. Patient DO NOT RESUSCITATE. Unable to get IV access and hence IO line placed by EDP, pending PICC.   Assessment/Plan:  1. Hypernatremic dehydration: Secondary to poor oral intake related to advanced dementia. Continue hydration with hypotonic fluids and follow BMP closely. 2. Acute on stage III chronic kidney disease: Secondary to problem #1. Continue IV fluids and follow BMP. If her renal functions do not improve, consider renal ultrasound. 3. Mild hyperkalemia: Treat for acute renal failure as above and follow BMP closely. Kayexalate enema. 4. Stage III sacral decubitus and right foot pressure ulcers: Wound care consultation. 5. Advanced dementia: Mental status may be close to baseline. Was on regular diet at SNF and able to tolerate medications with applesauce. Await speech therapy swallow evaluation. Minimize pain medications. Continue prior benzodiazepines, Prozac and Seroquel-probably has issues with agitation/behavior disturbance.  6. Hypertension: Soft blood pressures. Continue beta blockers. 7. Anemia: Follow CBCs   Code Status: DO NOT RESUSCITATE Family Communication: Left message for patient's DSS guardian-would like to discuss goals of care/? Palliative care consultation Disposition Plan: Return to SNF when medically stable.   Consultants:  None  Procedures:  IO line  Antibiotics:  None   Subjective: Non verbal.  Objective: Filed Vitals:   07/12/14 0715  07/12/14 0806 07/12/14 0849 07/12/14 0900  BP: 120/56  110/50   Pulse:  77 79   Temp:   97.5 F (36.4 C)   TempSrc:   Axillary   Resp: Height:     (1.651 m)  Weight:    68.1 kg (150 lb 2.1 oz)  SpO2:  100%  94%   No intake or output data in the 24 hours ending 07/12/14 1151 Filed Weights   07/12/14 0900  Weight: 68.1 kg (150 lb 2.1 oz)     Exam:  General exam: Elderly frail chronically ill-looking female lying comfortably supine in bed. Respiratory system: Poor inspiratory effort but seems clear to auscultation. No increased work of breathing. Cardiovascular system: S1 & S2 heard, RRR. No JVD, murmurs, gallops, clicks or pedal edema. Telemetry: Sinus rhythm. Gastrointestinal system: Abdomen is nondistended, soft and nontender. Normal bowel sounds heard. Central nervous system: Appears alert, mumbles incomprehensibly. Not oriented and does not follow instructions Extremities: Spontaneously moves all extremities. Thickening of skin/pressure damage over lateral aspect of right heel and ball of little toe. Skin: Stage III sacral decubitus without features suggest of acute infection.    Data Reviewed: Basic Metabolic Panel:  Recent Labs Lab 07/12/14 0054 07/12/14 0914  NA 157* 157*  K 4.7 5.5*  CL 118* 120*  CO2 23 19  GLUCOSE 134* 123*  BUN 101* 102*  CREATININE 3.78* 3.60*  CALCIUM 9.8 8.8   Liver Function Tests:  Recent Labs Lab 07/12/14 0914  AST 42*  ALT 11  ALKPHOS 69  BILITOT <0.2*  PROT 7.4  ALBUMIN 1.8*   No results found for this basename: LIPASE, AMYLASE,  in the last 168 hours No results found for this basename: AMMONIA,  in the last 168 hours CBC:  Recent Labs Lab 07/12/14 0054  WBC 12.2*  NEUTROABS 8.7*  HGB 10.9*  HCT 36.1  MCV 97.0  PLT 256   Cardiac Enzymes: No results found for this basename: CKTOTAL, CKMB, CKMBINDEX, TROPONINI,  in the last 168 hours BNP (last 3 results) No results found for this basename:  PROBNP,  in the last 8760 hours CBG: No results found for this basename: GLUCAP,  in the last 168 hours  No results found for this or any previous visit (from the past 240 hour(s)).     Studies: Dg Chest Port 1 View  07/12/2014   CLINICAL DATA:  Dehydration, dementia  EXAM: PORTABLE CHEST - 1 VIEW  COMPARISON:  11/21/2013  FINDINGS: Lungs are essentially clear. No focal consolidation. No pleural effusion or pneumothorax.  The heart is top-normal in size.  IMPRESSION: No evidence of acute cardiopulmonary disease.   Electronically Signed   By: Charline Bills M.D.   On: 07/12/2014 09:06        Scheduled Meds: . [START ON 07/13/2014] enoxaparin (LOVENOX) injection  30 mg Subcutaneous Q24H  . [START ON 07/14/2014] fentaNYL  12.5 mcg Transdermal Q72H  . FLUoxetine  10 mg Oral Daily  . LORazepam  0.5 mg Oral BID  . [START ON 07/13/2014] pneumococcal 23 valent vaccine  0.5 mL Intramuscular Tomorrow-1000  . QUEtiapine  25 mg Oral BID  . senna-docusate  2 tablet Oral QHS  . sodium chloride  3 mL Intravenous Q12H  . sodium polystyrene  15 g Rectal Once   Continuous Infusions: . dextrose 5 % and 0.45% NaCl      Principal Problem:   Dehydration with hypernatremia Active Problems:   Sacral decubitus ulcer, stage III   Decubital ulcer   Hypertension   Unspecified hypothyroidism   Acute on chronic renal failure   Hyperkalemia    Time spent: 40 minutes.    Marcellus Scott, MD, FACP, FHM. [facility] Pager [phone]  If 7PM-7AM, please contact night-coverage [email or web address] Password [email or web address] 07/12/2014, 11:51 AM    LOS: 1 day

## 2014-07-12 NOTE — ED Provider Notes (Signed)
Medical screening examination/treatment/procedure(s) were conducted as a shared visit with non-physician practitioner(s) and myself.  I personally evaluated the patient during the encounter. Patient is a 80 year old female with history dementia sent from [facility] for evaluation of weakness, decreased responsiveness.  She is non-verbal and adds no additional history.  On exam, the patient is afebrile.  Blood pressure is somewhat low, but vitals are otherwise stable.  Head is AT, .  Neck is supple.  Heart is RRR and lungs are clear.  Abd is soft.  Extremities are contracted with sores to both heals.  Skin has poor turgor.    Workup reveals ARF, dehydration.  Multiple attempts at IV access were obtained by nursing staff, IV team, and by physician with US guidance.  All were unsuccessful.  She ultimately required an intraosseus line to the right tibia to obtain access pending a PICC line consultation.  She will be admitted to the hospitalist service.    EKG Interpretation   Date/Time:  Friday July 11 2014 23:43:10 EDT Ventricular Rate:  82 PR Interval:  169 QRS Duration: 82 QT Interval:  417 QTC Calculation: 487 R Axis:   -22 Text Interpretation:  Sinus rhythm Borderline left axis deviation Low  voltage, precordial leads Borderline prolonged QT interval Confirmed by  Malva Cogan  MD, Melah Ebling ([id number]) on 07/12/2014 12:13:51 AM       Geoffery Lyons, MD 07/12/14 1345

## 2014-07-12 NOTE — Progress Notes (Signed)
Peripherally Inserted Central Catheter/Midline Placement  The IV Nurse has discussed with the patient and/or persons authorized to consent for the patient, the purpose of this procedure and the potential benefits and risks involved with this procedure.  The benefits include less needle sticks, lab draws from the catheter and patient may be discharged home with the catheter.  Risks include, but not limited to, infection, bleeding, blood clot (thrombus formation), and puncture of an artery; nerve damage and irregular heat beat.  Alternatives to this procedure were also discussed.  Consent obtained via telephone by Lynnell Dike Division Director for DSS.  PICC/Midline Placement Documentation  PICC / Midline Single Lumen 07/12/14 PICC Right Brachial 41 cm 1 cm (Active)  Indication for Insertion or Continuance of Line Poor Vasculature-patient has had multiple peripheral attempts or PIVs lasting less than 24 hours 07/12/2014  2:53 PM  Exposed Catheter (cm) 1 cm 07/12/2014  2:53 PM  Site Assessment Clean;Dry;Intact 07/12/2014  2:53 PM  Line Status Flushed;Saline locked;Blood return noted 07/12/2014  2:53 PM  Dressing Type Transparent 07/12/2014  2:53 PM  Dressing Status Clean;Dry;Intact;Antimicrobial disc in place 07/12/2014  2:53 PM  Line Care Connections checked and tightened 07/12/2014  2:53 PM  Line Adjustment (NICU/IV Team Only) No 07/12/2014  2:53 PM  Dressing Intervention New dressing 07/12/2014  2:53 PM  Dressing Change Due 07/19/14 07/12/2014  2:53 PM       Elliot Dally 07/12/2014, 2:54 PM

## 2014-07-12 NOTE — ED Notes (Signed)
Assisted Dr. Patria Mane and Dr. Judd Lien with OI placement for pt. PT tolerated procedure well.

## 2014-07-12 NOTE — ED Notes (Signed)
Pt.'s  PIV at left [facility] got infiltrated, attempts to access another IV line by 3 ED staff Nurses and IV Team Nurse, unsuccessful. Dr. Was aware. Dr. Judd Lien  Assessed pt. For IV access, recommended  PICC placement , to call family for consent. To notify MD. Receiving Nurse Colin Mulders made aware that pt. Held here in ED for PICC placement .

## 2014-07-12 NOTE — ED Notes (Addendum)
Right tibial adult sized IO Inserted by MD Campos. IO with NS infusing without difficulty. Pt tolerating well.

## 2014-07-12 NOTE — H&P (Signed)
PCP:   Florentina Jenny, MD    Chief Complaint:  Poor by mouth intake  HPI: Lindsay Browning is a 80 y.o. female   has a past medical history of Medical history unknown; Hypertension; and Renal disorder.   Presented with  Patient resides at [facility]. She is nonverbal. Per staff patient had had progressive decrease in functioning increased fatigue, weight loss, stopped eating and was brought in to emerge department. Patient was found to be hypernatremic with sodium up to 157 and in acute renal failure with creatinine 3.78 from baseline of 1.3 No family at bedside  Hospitalist was called for admission for hypernatremia, dehydration and acute on chronic renal  Review of Systems:  Unable to obtain this patient is severely demented and nonverbal Past Medical History: Past Medical History  Diagnosis Date  . Medical history unknown   . Hypertension   . Renal disorder    Past Surgical History  Procedure Laterality Date  . Surgical history unknown    . Cardiac surgery      cath  . Foot surgery       Medications: Prior to Admission medications   Medication Sig Start Date End Date Taking? Authorizing Provider  ascorbic acid (VITAMIN C) 500 MG tablet Take 1 tablet (500 mg total) by mouth 2 (two) times daily. 11/29/13  Yes Shanker Levora Dredge, MD  ergocalciferol (VITAMIN D2) 50000 UNITS capsule Take 50,000 Units by mouth every 30 (thirty) days.   Yes Historical Provider, MD  fentaNYL (DURAGESIC - DOSED MCG/HR) 12 MCG/HR Place 12.5 mcg onto the skin every 3 (three) days.   Yes Historical Provider, MD  FLUoxetine (PROZAC) 10 MG capsule Take 10 mg by mouth daily.   Yes Historical Provider, MD  LORazepam (ATIVAN) 0.5 MG tablet Take 0.5 mg by mouth 2 (two) times daily.   Yes Historical Provider, MD  Multiple Vitamins-Minerals (CERTA-VITE PO) Take 1 tablet by mouth daily.    Yes Historical Provider, MD  nebivolol (BYSTOLIC) 10 MG tablet Take 10 mg by mouth daily.   Yes Historical Provider,  MD  QUEtiapine (SEROQUEL) 25 MG tablet Take 25 mg by mouth 2 (two) times daily.    Yes Historical Provider, MD  senna-docusate (SENOKOT-S) 8.6-50 MG per tablet Take 2 tablets by mouth at bedtime. 11/29/13  Yes Shanker Levora Dredge, MD  lidocaine (LIDODERM) 5 % Place 1 patch onto the skin daily. Apply to left knee, Remove & Discard patch within 12 hours or as directed by MD    Historical Provider, MD  NONFORMULARY OR COMPOUNDED ITEM Apply 1 mL topically 2 (two) times daily as needed (for anxiety or agitation). Lorazepam /1ML    Historical Provider, MD  oxyCODONE-acetaminophen (PERCOCET/ROXICET) 5-325 MG per tablet Take 1-2 tablets by mouth every 6 (six) hours as needed for severe pain.    Historical Provider, MD    Allergies:   Allergies  Allergen Reactions  . Penicillins Other (See Comments)    Unknown - pt states she's never taken PCN before, per [facility]    Social History:   From facility [facility]   reports that she does not drink alcohol or use illicit drugs.    Family History: family history is not on file.    Physical Exam: Patient Vitals for the past 24 hrs:  BP Temp Temp src Pulse Resp SpO2  07/12/14 0430 108/54 mmHg - - - 20 -  07/12/14 0400 114/55 mmHg - - - 22 -  07/12/14 0345 94/53 mmHg - - -  25 -  07/12/14 0330 96/46 mmHg - - - 18 -  07/12/14 0315 102/82 mmHg - - - 13 -  07/12/14 0300 96/48 mmHg - - - 18 -  07/12/14 0230 95/53 mmHg - - - 16 -  07/12/14 0200 105/52 mmHg - - - 18 -  07/12/14 0130 107/46 mmHg - - 82 14 -  07/12/14 0008 - 99.5 F (37.5 C) Rectal - - -  07/11/14 2320 100/54 mmHg - - 80 16 -  07/11/14 2307 100/54 mmHg - - 84 16 -  07/11/14 2255 - - - - - 95 %    1. General:  in No Acute distress 2. Psychological: Alert notOriented 3. Head/ENT:   Dry Mucous Membranes                          Head Non traumatic, neck supple                           Poor Dentition 4. SKIN: decreased Skin turgor,  Skin clean Dry decubitus ulcers unstageable in the  sacrum noted. Left foot significant for black eschar unstageable 5. Heart: Regular rate and rhythm no Murmur, Rub or gallop 6. Lungs: Clear to auscultation bilaterally, no wheezes or crackles   7. Abdomen: Soft, non-tender, Non distended 8. Lower extremities: no clubbing, cyanosis, or edema 9. Neurologically Grossly intact, moving all 4 extremities equally 10. MSK: Normal range of motion  body mass index is unknown because there is no weight on file.   Labs on Admission:   Recent Labs  07/12/14 0054  NA 157*  K 4.7  CL 118*  CO2 23  GLUCOSE 134*  BUN 101*  CREATININE 3.78*  CALCIUM 9.8   No results found for this basename: AST, ALT, ALKPHOS, BILITOT, PROT, ALBUMIN,  in the last 72 hours No results found for this basename: LIPASE, AMYLASE,  in the last 72 hours  Recent Labs  07/12/14 0054  WBC 12.2*  NEUTROABS 8.7*  HGB 10.9*  HCT 36.1  MCV 97.0  PLT 256   No results found for this basename: CKTOTAL, CKMB, CKMBINDEX, TROPONINI,  in the last 72 hours No results found for this basename: TSH, T4TOTAL, FREET3, T3FREE, THYROIDAB,  in the last 72 hours No results found for this basename: VITAMINB12, FOLATE, FERRITIN, TIBC, IRON, RETICCTPCT,  in the last 72 hours No results found for this basename: HGBA1C    The CrCl is unknown because both a height and weight (above a minimum accepted value) are required for this calculation. ABG No results found for this basename: phart, pco2, po2, hco3, tco2, acidbasedef, o2sat     No results found for this basename: DDIMER     Other results:  I have pearsonaly reviewed this: ECG REPORT  Rate: 82 sinus rhythm  Rhythm:  ST&T Change: No ischemic changes   UA no evidence of UTI  BNP (last 3 results) No results found for this basename: PROBNP,  in the last 8760 hours  There were no vitals filed for this visit.   Cultures:    Component Value Date/Time   SDES URINE, CATHETERIZED 06/22/2014 2010   SPECREQUEST NONE 06/22/2014  2010   CULT  Value: PROTEUS MIRABILIS Performed at [facility] 06/22/2014 2010   REPTSTATUS 06/26/2014 FINAL 06/22/2014 2010    Radiological Exams on Admission: No results found.  Chart has been reviewed  Assessment/Plan  80 year old nonverbal female  with severe dementia was brought in from [facility] due to progressive decrease in functioning poor by mouth intake and debility patient was found to have acute on chronic renal failure and hypernatremia  Present on Admission:  . Sacral decubitus ulcer, stage III - wound care consult  . Chronic kidney disease, unspecified - worsening renal function likely second to dehydration will give the fluids  . Acute on chronic renal failure - give IV fluids obtain urine electrolytes hold renal toxic medications  . Hypernatremia - give half normal saline at 100 hour check sodium levels frequently  . Dehydration - administer IV fluids   Prophylaxis:   Lovenox, Protonix  CODE STATUS:    DNR/DNI patient has advanced care planning. Patient's guardian is Junita Push [phone] Okay to give IV for hydration, antibiotics oral antibiotics IM and IV, hospitalization either of a critical symptom management laboratory studies and diagnostic testings. Things that are undecided tube feedings, blood transfusion, pacemaker, major surgery  Other plan as per orders.  I have spent a total of 55 min on this admission  Jacoya Bauman 07/12/2014, 4:58 AM  [facility]  Pager [phone]   If 7AM-7PM, please contact the day team taking care of the patient  [email or web address]  Password [email or web address]

## 2014-07-12 NOTE — ED Notes (Signed)
Attempted to access IV line for meds and lab works, unsuccessful. IV Team paged.

## 2014-07-12 NOTE — Evaluation (Signed)
Clinical/Bedside Swallow Evaluation Patient Details  Name: Lindsay Browning MRN: [id number] Date of Birth: [birth year]  Today's Date: 07/12/2014 Time: 4098-1191 SLP Time Calculation (min): 13 min  Past Medical History:  Past Medical History  Diagnosis Date  . Medical history unknown   . Hypertension   . Renal disorder    Past Surgical History:  Past Surgical History  Procedure Laterality Date  . Surgical history unknown    . Cardiac surgery      cath  . Foot surgery     HPI:  80 y.o. PMH: HTN, renal disorder admitted with progressive decrease in functioning increased fatigue, weight loss, stopped eating.  Found to be hypernatremic.  CXR no evidence of acute cardiopulmonary disease.  Per MD note pt. on regular diet at SNF and able to tolerate medications with applesauce.   Assessment / Plan / Recommendation Clinical Impression  SLP arrived as RN feeding pt. late lunch tray.  Pt. with poor awareness and oral, manipulation resulting in residue on tongue, hard palate and right buccal cavity.  No attempts made to manipulate given total tactile/verbal/visual stimuli, therefore, oral cavity cleaned.  She appeared in pain attempting to move in bed.  SLP and RN repositioned pt. and she no longer opened oral cavity to accept po's; keeping eyes shut, falling asleep.  SLP will downgrade to Dys 1, nectar, however, do not feed unless alert; continue regular oral care.  Prognosis for safe and efficient swallow function appears poor at this time and may benefit from Palliative care.  SLP will follow briefly.      Aspiration Risk  Severe    Diet Recommendation Dysphagia 1 (Puree);Nectar-thick liquid   Liquid Administration via: Cup;No straw Medication Administration: Crushed with puree Supervision: Staff to assist with self feeding;Full supervision/cueing for compensatory strategies Compensations: Slow rate;Small sips/bites;Check for pocketing Postural Changes and/or Swallow Maneuvers: Seated  upright 90 degrees;Upright 30-60 min after meal    Other  Recommendations Oral Care Recommendations: Oral care BID   Follow Up Recommendations  None    Frequency and Duration min 1 x/week  2 weeks   Pertinent Vitals/Pain Evidence of pain, RN aware      Swallow Study         Oral/Motor/Sensory Function Overall Oral Motor/Sensory Function:  (not following directions, no overt asymmetry)   Ice Chips Ice chips: Not tested   Thin Liquid Thin Liquid: Not tested (unable to accept/ no attempts)    Nectar Thick Nectar Thick Liquid: Not tested   Honey Thick Honey Thick Liquid: Not tested   Puree Puree: Impaired Presentation: Spoon Oral Phase Impairments: Reduced lingual movement/coordination;Impaired anterior to posterior transit;Poor awareness of bolus Oral Phase Functional Implications: Left lateral sulci pocketing;Right lateral sulci pocketing;Oral residue;Oral holding Pharyngeal Phase Impairments:  (no pharyngeal swallow initiated)   Solid   GO    Solid: Not tested       Royce Macadamia 07/12/2014,4:21 PM Breck Coons Lonell Face.Ed [employer] [phone]

## 2014-07-12 NOTE — ED Notes (Signed)
MD Campos at bedside.  

## 2014-07-12 NOTE — ED Provider Notes (Signed)
SEE PROCEDURE NOTE BELOW   Physical Exam  BP 120/56  Pulse 77  Temp(Src) 99.5 F (37.5 C) (Rectal)  Resp 21  SpO2 100%  Physical Exam  Nursing note and vitals reviewed. Constitutional: She appears well-developed and well-nourished.  HENT:  Head: Normocephalic.  Dry mucous membrane   Eyes: EOM are normal.  Neck: Normal range of motion.  Pulmonary/Chest: Effort normal.  Abdominal: She exhibits no distension.  Musculoskeletal: Normal range of motion.  Neurological: She is alert.  Psychiatric: She has a normal mood and affect.    ED Course  IO LINE INSERTION Date/Time: 07/12/2014 8:26 AM Performed by: Lyanne Co Authorized by: Lyanne Co Consent: Verbal consent not obtained. written consent not obtained. The procedure was performed in an emergent situation. Required items: required blood products, implants, devices, and special equipment available Patient identity confirmed: arm band, provided demographic data and hospital-assigned identification number Time out: Immediately prior to procedure a "time out" was called to verify the correct patient, procedure, equipment, support staff and site/side marked as required. Indications: fluid administration Local anesthesia used: yes Local anesthetic: lidocaine 2% without epinephrine Anesthetic total: 5 ml Patient sedated: no Insertion site: right proximal tibia Site preparation: chlorhexidine Preparation: Patient was prepped and draped in the usual sterile fashion. Insertion device: drill device Insertion: needle was inserted through the bony cortex Number of attempts: 1 Confirmation method: stability of the needle, easy infusion of fluids and aspiration of blood/marrow Secured with: protective shield Patient tolerance: Patient tolerated the procedure well with no immediate complications.    Angiocath insertion Performed by: Lyanne Co Consent: Verbal consent obtained. Risks and benefits: risks, benefits and  alternatives were discussed Time out: Immediately prior to procedure a "time out" was called to verify the correct patient, procedure, equipment, support staff and site/side marked as required. Preparation: Patient was prepped and draped in the usual sterile fashion. Vein Location: left basilic vein Ultrasound Guided Gauge: 20 Normal blood return and flush without difficulty Patient tolerance: Patient tolerated the procedure well with no immediate complications. Unfortunately, shortly after infusion began the IV became infiltrated. IV was removed without complication and pressure dressing applied        Lyanne Co, MD 07/12/14 [phone]

## 2014-07-12 NOTE — ED Notes (Signed)
Patient's chart accessed by this RN to document IO access.

## 2014-07-12 NOTE — ED Notes (Signed)
Pt. Still in room , Dr. Judd Lien and Dr. Patria Mane still working to access IV line. Endorsed to [place], Charity fundraiser  And report given to Darl Pikes on the Floor.

## 2014-07-13 DIAGNOSIS — I10 Essential (primary) hypertension: Secondary | ICD-10-CM

## 2014-07-13 DIAGNOSIS — E870 Hyperosmolality and hypernatremia: Secondary | ICD-10-CM | POA: Diagnosis not present

## 2014-07-13 LAB — BASIC METABOLIC PANEL
ANION GAP: 15 (ref 5–15)
ANION GAP: 13 (ref 5–15)
BUN: 97 mg/dL — ABNORMAL HIGH (ref 6–23)
BUN: 88 mg/dL — ABNORMAL HIGH (ref 6–23)
CALCIUM: 8.4 mg/dL (ref 8.4–10.5)
CHLORIDE: 117 meq/L — AB (ref 96–112)
CO2: 22 mEq/L (ref 19–32)
CO2: 21 meq/L (ref 19–32)
Calcium: 8.7 mg/dL (ref 8.4–10.5)
Chloride: 110 mEq/L (ref 96–112)
Creatinine, Ser: 3.38 mg/dL — ABNORMAL HIGH (ref 0.50–1.10)
Creatinine, Ser: 3.01 mg/dL — ABNORMAL HIGH (ref 0.50–1.10)
GFR calc Af Amer: 13 mL/min — ABNORMAL LOW (ref >90)
GFR calc Af Amer: 14 mL/min — ABNORMAL LOW (ref >90)
GFR, EST NON AFRICAN AMERICAN: 11 mL/min — AB (ref >90)
GFR, EST NON AFRICAN AMERICAN: 12 mL/min — AB (ref >90)
GLUCOSE: 145 mg/dL — AB (ref 70–99)
Glucose, Bld: 236 mg/dL — ABNORMAL HIGH (ref 70–99)
POTASSIUM: 4 meq/L (ref 3.7–5.3)
POTASSIUM: 3.8 meq/L (ref 3.7–5.3)
SODIUM: 154 meq/L — AB (ref 137–147)
Sodium: 144 mEq/L (ref 137–147)

## 2014-07-13 LAB — TSH: TSH: 6.42 u[IU]/mL — ABNORMAL HIGH (ref 0.350–4.500)

## 2014-07-13 LAB — CBC
HCT: 28.7 % — ABNORMAL LOW (ref 36.0–46.0)
HEMOGLOBIN: 8.5 g/dL — AB (ref 12.0–15.0)
MCH: 28.7 pg (ref 26.0–34.0)
MCHC: 29.6 g/dL — ABNORMAL LOW (ref 30.0–36.0)
MCV: 97 fL (ref 78.0–100.0)
Platelets: 183 10*3/uL (ref 150–400)
RBC: 2.96 MIL/uL — AB (ref 3.87–5.11)
RDW: 14.6 % (ref 11.5–15.5)
WBC: 10.8 10*3/uL — ABNORMAL HIGH (ref 4.0–10.5)

## 2014-07-13 LAB — PREALBUMIN: PREALBUMIN: 3.4 mg/dL — AB (ref 17.0–34.0)

## 2014-07-13 MED ORDER — DEXTROSE 5 % IV SOLN
INTRAVENOUS | Status: AC
  Administered 2014-07-13 (×2): via INTRAVENOUS

## 2014-07-13 MED ORDER — DEXTROSE 5 % IV BOLUS
250.0000 mL | Freq: Once | INTRAVENOUS | Status: AC
  Administered 2014-07-13: 250 mL via INTRAVENOUS

## 2014-07-13 NOTE — Progress Notes (Signed)
Call placed to floor coverage, T. Claiborne Billings to report patient urine output over previous 3.5 hours 75 mL. Discussed patient with decreased output throughout day. Reviewed IV fluids and lab values. Provider will place order for 250 mL bolus. Will monitor output over next 3-4 hours and call back if not adequate.

## 2014-07-13 NOTE — Progress Notes (Signed)
PROGRESS NOTE    Lindsay Browning:[id number] DOB: [birth year] DOA: 07/11/2014 PCP: Florentina Jenny, MD  HPI/Brief narrative 80 year old female patient, SNF resident, has DSS guardian, likely advanced dementia & nonverbal, hypertension, chronic kidney disease, sacral and right foot decubitus ulcers, transferred from nursing home secondary to progressive decrease in functioning, fatigue, weight loss, not eating. In the ED, found to be hypernatremic with sodium 157 and acute renal failure with creatinine 3.78. Patient DO NOT RESUSCITATE. Unable to get IV access>IO>PICC line.    Assessment/Plan:  1. Hypernatremic dehydration: Secondary to poor oral intake related to advanced dementia. Continue hydration with hypotonic fluids and follow BMP closely. Sodium slowly improving. 2. Acute on stage III chronic kidney disease: Secondary to problem #1. If her renal functions do not improve, consider renal ultrasound. Oliguric. Probably stool intravascularly volume repleted. Increase IV fluids and follow BMP closely. Foley placed 9/5 for close intake and output monitoring secondary to incontinence 3. Mild hyperkalemia: Treat for acute renal failure as above and follow BMP closely. Kayexalate enema. Resolved. 4. Stage III sacral decubitus and right foot pressure ulcers: Wound care consultation-pending. 5. Advanced dementia: Mental status may be close to baseline. Was on regular diet at SNF and able to tolerate medications with applesauce. Await speech therapy swallow evaluation. Minimize pain medications. Continue prior benzodiazepines, Prozac and Seroquel-probably has issues with agitation/behavior disturbance. Speech therapy evaluated patient and recommended dysphagia 1 diet and nectar thick liquids. Patient at high risk of aspiration secondary to advanced dementia. 6. Hypertension: Controlled. Continue beta blockers. 7. Anemia: Hemoglobin dropped from 10.9 > 8.5, likely dilutional. Follow CBCs 8. Dysphagia:  Dysphagia 1 diet and nectar thick liquids per speech therapist recommendation.   Code Status: DO NOT RESUSCITATE Family Communication: Nursing has left message for patient's DSS guardian to call M.D.-would like to discuss goals of care/? Palliative care consultation Disposition Plan: Return to SNF when medically stable.   Consultants:  None  Procedures:  IO line 9/5 > 9/5  Foley catheter-9/5 >  Antibiotics:  None   Subjective: Non verbal. As per nursing, low urine output despite IV fluids and a 500 mL IV bolus x1 overnight  Objective: Filed Vitals:   07/12/14 0900 07/12/14 1530 07/12/14 2117 07/13/14 0511  BP:  117/94 129/41 150/95  Pulse:  70 67 74  Temp:   97.4 F (36.3 C) 97.4 F (36.3 C)  TempSrc:   Axillary Axillary  Resp:  Height:  (1.651 m)     Weight: 68.1 kg (150 lb 2.1 oz)     SpO2: 94% 99% 98% 93%    Intake/Output Summary (Last 24 hours) at 07/13/14 8119 Last data filed at 07/13/14 0700  Gross per 24 hour  Intake 2093.75 ml  Output    375 ml  Net 1718.75 ml   Filed Weights   07/12/14 0900  Weight: 68.1 kg (150 lb 2.1 oz)     Exam:  General exam: Elderly frail chronically ill-looking female lying comfortably supine in bed. Respiratory system: Poor inspiratory effort but seems clear to auscultation. No increased work of breathing. Cardiovascular system: S1 & S2 heard, RRR. No JVD, murmurs, gallops, clicks or pedal edema. Telemetry: Sinus rhythm -DC'ed 9/6. Gastrointestinal system: Abdomen is nondistended, soft and nontender. Normal bowel sounds heard. Central nervous system: Appears alert, mumbles incomprehensibly. Not oriented and does not follow instructions Extremities: Spontaneously moves all extremities. Thickening of skin/pressure damage over lateral aspect of right heel and ball of little toe. Skin: Stage III  sacral decubitus without features suggest of acute infection.    Data Reviewed: Basic Metabolic Panel:  Recent  Labs Lab 07/12/14 0054 07/12/14 0914 07/12/14 1630 07/13/14 0409  NA 157* 157* 161* 154*  K 4.7 5.5* 4.7 4.0  CL 118* 120* 122* 117*  CO2 GLUCOSE 134* 123* 157* 236*  BUN 101* 102* 102* 97*  CREATININE 3.78* 3.60* 3.64* 3.38*  CALCIUM 9.8 8.8 9.0 8.7  MG  --   --  2.9*  --   PHOS  --   --  5.1*  --    Liver Function Tests:  Recent Labs Lab 07/12/14 0914  AST 42*  ALT 11  ALKPHOS 69  BILITOT <0.2*  PROT 7.4  ALBUMIN 1.8*   No results found for this basename: LIPASE, AMYLASE,  in the last 168 hours No results found for this basename: AMMONIA,  in the last 168 hours CBC:  Recent Labs Lab 07/12/14 0054 07/13/14 0409  WBC 12.2* 10.8*  NEUTROABS 8.7*  --   HGB 10.9* 8.5*  HCT 36.1 28.7*  MCV 97.0 97.0  PLT 256 183   Cardiac Enzymes: No results found for this basename: CKTOTAL, CKMB, CKMBINDEX, TROPONINI,  in the last 168 hours BNP (last 3 results) No results found for this basename: PROBNP,  in the last 8760 hours CBG: No results found for this basename: GLUCAP,  in the last 168 hours  Recent Results (from the past 240 hour(s))  MRSA PCR SCREENING     Status: Abnormal   Collection Time    07/12/14 11:01 AM      Result Value Ref Range Status   MRSA by PCR POSITIVE (*) NEGATIVE Final   Comment:            The GeneXpert MRSA Assay (FDA     approved for NASAL specimens     only), is one component of a     comprehensive MRSA colonization     surveillance program. It is not     intended to diagnose MRSA     infection nor to guide or     monitor treatment for     MRSA infections.     RESULT CALLED TO, READ BACK BY AND VERIFIED WITH:     Brynda Greathouse [id number] @ 1236 BY J SCOTTON       Studies: Dg Chest Port 1 View  07/12/2014   CLINICAL DATA:  Dehydration, dementia  EXAM: PORTABLE CHEST - 1 VIEW  COMPARISON:  11/21/2013  FINDINGS: Lungs are essentially clear. No focal consolidation. No pleural effusion or pneumothorax.  The heart is  top-normal in size.  IMPRESSION: No evidence of acute cardiopulmonary disease.   Electronically Signed   By: Charline Bills M.D.   On: 07/12/2014 09:06        Scheduled Meds: . enoxaparin (LOVENOX) injection  30 mg Subcutaneous Q24H  . [START ON 07/14/2014] fentaNYL  12.5 mcg Transdermal Q72H  . FLUoxetine  10 mg Oral Daily  . LORazepam  0.5 mg Oral BID  . pneumococcal 23 valent vaccine  0.5 mL Intramuscular Tomorrow-1000  . QUEtiapine  25 mg Oral BID  . senna-docusate  2 tablet Oral QHS  . sodium chloride  3 mL Intravenous Q12H   Continuous Infusions: . dextrose 125 mL/hr at 07/13/14 9147    Principal Problem:   Dehydration with hypernatremia Active Problems:   Sacral decubitus ulcer, stage III   Decubital ulcer   Hypertension   Unspecified hypothyroidism  Acute on chronic renal failure   Hyperkalemia    Time spent: 40 minutes.    Marcellus Scott, MD, FACP, FHM. [facility] Pager [phone]  If 7PM-7AM, please contact night-coverage [email or web address] Password [email or web address] 07/13/2014, 8:33 AM    LOS: 2 days

## 2014-07-13 NOTE — Progress Notes (Signed)
During assessment patient found to have applesauce in mouth from more than 2 hours ago. Mouth swabbed and remaining food removed. PO meds held at this time due to patient not swallowing.

## 2014-07-13 NOTE — Progress Notes (Signed)
Call placed to floor coverage T. Claiborne Billings, informed him after patient received 250 mL bolus has only had 25 mL of urine in previous 2 hours. Order for additional 250 mL bolus received.

## 2014-07-13 NOTE — Consult Note (Signed)
WOC wound consult note Reason for Consult: Bilateral buttock pressure ulcer (Stage III), Unstageable pressure ulcer on left heel Wound type: Pressure Pressure Ulcer POA: Yes Measurement:Left Heel:  Dried black firmly adherent eschar measuring 6cm x 5.5cm.  Left great toe (hallux): 1.5cm round dark purple, skin-level sDTI.  Right heel intact, but mushy, no discoloration.  Left buttocks:  9cm x 6cm x 0.4cm Stage III PrU, 60% pink tissue, 40% thin yellow slough that is dissolving via autolysis.  Right Buttocks:  3cm x 3cm x 0.4cm Stage III Pressure Ulcer, 100% pink base. In both buttock pressure ulcers there is evidence of previous wound healing at periphery; scarring and return of pigmentation. Wound bed:As described above. Drainage (amount, consistency, odor) None from heel or hallus.  Light yellow exudate from buttocks, left > right. Periwound:AS described above. Dressing procedure/placement/frequency:I will provide a therapeutic sleep surface and bilateral pressure redistribution boots for pressure ulcer healing and prevention. Topical wound care for the heel is a silicone dressing and of course, the pressure redistribution. The buttock ulcerations require addition of a calcium alginate dressing beneath the soft silicone dressing to absorb excess exudate and to enhance autolytic debridement. Frequent turning and repositioning of the patient is indicated despite the therapeutic sleep surface, for pulmonary and other reasons. WOC nursing team will not follow, but will remain available to this patient, the nursing and medical team.  Please re-consult if needed. Thanks, Ladona Mow, MSN, RN, GNP, [place], CWON-AP [phone])

## 2014-07-14 ENCOUNTER — Inpatient Hospital Stay (HOSPITAL_COMMUNITY): Payer: Medicare Other

## 2014-07-14 DIAGNOSIS — R1310 Dysphagia, unspecified: Secondary | ICD-10-CM

## 2014-07-14 LAB — BASIC METABOLIC PANEL
ANION GAP: 14 (ref 5–15)
BUN: 85 mg/dL — ABNORMAL HIGH (ref 6–23)
CO2: 21 meq/L (ref 19–32)
Calcium: 8.3 mg/dL — ABNORMAL LOW (ref 8.4–10.5)
Chloride: 107 mEq/L (ref 96–112)
Creatinine, Ser: 2.96 mg/dL — ABNORMAL HIGH (ref 0.50–1.10)
GFR calc non Af Amer: 13 mL/min — ABNORMAL LOW (ref >90)
GFR, EST AFRICAN AMERICAN: 15 mL/min — AB (ref >90)
Glucose, Bld: 103 mg/dL — ABNORMAL HIGH (ref 70–99)
POTASSIUM: 3.5 meq/L — AB (ref 3.7–5.3)
Sodium: 142 mEq/L (ref 137–147)

## 2014-07-14 LAB — CBC
HEMATOCRIT: 26.8 % — AB (ref 36.0–46.0)
Hemoglobin: 8.1 g/dL — ABNORMAL LOW (ref 12.0–15.0)
MCH: 28.7 pg (ref 26.0–34.0)
MCHC: 30.2 g/dL (ref 30.0–36.0)
MCV: 95 fL (ref 78.0–100.0)
PLATELETS: 186 10*3/uL (ref 150–400)
RBC: 2.82 MIL/uL — ABNORMAL LOW (ref 3.87–5.11)
RDW: 14 % (ref 11.5–15.5)
WBC: 7.8 10*3/uL (ref 4.0–10.5)

## 2014-07-14 MED ORDER — SENNOSIDES 8.8 MG/5ML PO SYRP
10.0000 mL | ORAL_SOLUTION | Freq: Every day | ORAL | Status: DC
  Administered 2014-07-15 – 2014-07-23 (×5): 10 mL via ORAL
  Filled 2014-07-14 (×11): qty 10

## 2014-07-14 MED ORDER — LORAZEPAM 2 MG/ML PO CONC
0.5000 mg | Freq: Two times a day (BID) | ORAL | Status: DC
  Administered 2014-07-15 – 2014-07-24 (×12): 0.5 mg via ORAL
  Filled 2014-07-14 (×2): qty 1
  Filled 2014-07-14 (×2): qty 0.25
  Filled 2014-07-14 (×3): qty 1
  Filled 2014-07-14: qty 0.25
  Filled 2014-07-14 (×3): qty 1
  Filled 2014-07-14: qty 0.25
  Filled 2014-07-14: qty 1
  Filled 2014-07-14: qty 0.25
  Filled 2014-07-14: qty 1
  Filled 2014-07-14: qty 0.25

## 2014-07-14 MED ORDER — FLUOXETINE HCL 20 MG/5ML PO SOLN
10.0000 mg | Freq: Every day | ORAL | Status: DC
  Administered 2014-07-14 – 2014-07-16 (×3): 10 mg via ORAL
  Filled 2014-07-14 (×3): qty 5

## 2014-07-14 MED ORDER — DEXTROSE 5 % IV SOLN
INTRAVENOUS | Status: AC
  Administered 2014-07-14 – 2014-07-15 (×2): via INTRAVENOUS

## 2014-07-14 MED ORDER — DOCUSATE SODIUM 50 MG/5ML PO LIQD
100.0000 mg | Freq: Every day | ORAL | Status: DC
  Administered 2014-07-16 – 2014-07-23 (×4): 100 mg via ORAL
  Filled 2014-07-14 (×11): qty 10

## 2014-07-14 NOTE — Progress Notes (Signed)
Clinical Social Work Department CLINICAL SOCIAL WORK PLACEMENT NOTE 07/14/2014  Patient:  Lindsay Browning, Lindsay Browning  Account Number:  [id number] Admit date:  07/11/2014  Clinical Social Worker:  Unk Lightning, LCSW  Date/time:  07/14/2014 10:45 AM  Clinical Social Work is seeking post-discharge placement for this patient at the following level of care:   SKILLED NURSING   (*CSW will update this form in Epic as items are completed)   07/14/2014  Patient/family provided with [facility] Department of Clinical Social Work's list of facilities offering this level of care within the geographic area requested by the patient (or if unable, by the patient's family).  07/14/2014  Patient/family informed of their freedom to choose among providers that offer the needed level of care, that participate in Medicare, Medicaid or managed care program needed by the patient, have an available bed and are willing to accept the patient.  07/14/2014  Patient/family informed of [facility]' ownership interest in [facility], as well as of the fact that they are under no obligation to receive care at this facility.  PASARR submitted to EDS on existing # PASARR number received on   FL2 transmitted to all facilities in geographic area requested by pt/family on  07/14/2014 FL2 transmitted to all facilities within larger geographic area on   Patient informed that his/her managed care company has contracts with or will negotiate with  certain facilities, including the following:     Patient/family informed of bed offers received:   Patient chooses bed at  Physician recommends and patient chooses bed at    Patient to be transferred to  on   Patient to be transferred to facility by  Patient and family notified of transfer on  Name of family member notified:    The following physician request were entered in Epic:   Additional Comments:

## 2014-07-14 NOTE — Progress Notes (Signed)
Clinical Social Work Department BRIEF PSYCHOSOCIAL ASSESSMENT 07/14/2014  Patient:  Lindsay Browning, Lindsay Browning     Account Number:  [id number]     Admit date:  07/11/2014  Clinical Social Worker:  Dennison Bulla  Date/Time:  07/14/2014 10:45 AM  Referred by:  Physician  Date Referred:  07/14/2014 Referred for  Psychosocial assessment   Other Referral:   Interview type:  Other - See comment Other interview type:   Guardian-[name] [phone]    PSYCHOSOCIAL DATA Living Status:  FACILITY Admitted from facility:  [facility] Level of care:  Skilled Nursing Facility Primary support name:  Ms. Danna Hefty Primary support relationship to patient:  NONE Degree of support available:   Adequate-Guardian through DSS    CURRENT CONCERNS Current Concerns  Post-Acute Placement   Other Concerns:    SOCIAL WORK ASSESSMENT / PLAN CSW received referral in order to assist with DC planning. Per chart review, patient is from [facility] and has a guardian through DSS. CSW reviewed chart and attempted to meet with patient but patient unable to fully participate in assessment.    CSW spoke with guardian ([name]) through DSS. Guardian reports that Director ([name] [phone]) and assistant to director (Ms. Dion Saucier [phone]) are working with attending MD re: medical concerns but that [name] is making decisions re: SNF placement. Guardian is not satisfied with care with [facility] and wants patient to go to a different facility at DC. Guardian prefers placement at [facility] or [facility] because they have other clients there and feel that they receive adequate care. CSW explained DC plans and guardian aware and agreeable.    CSW completed FL2 and faxed out. CSW will follow up with guardian re: bed offers.   Assessment/plan status:  Psychosocial Support/Ongoing Assessment of Needs Other assessment/ plan:   Information/referral to community resources:   SNF list     PATIENT'S/FAMILY'S RESPONSE TO PLAN OF CARE: Patient unable to participate in assessment. Guardian engaged and involved and reports she wants what is best for patient. Guardian would feel more secure with patient's placement if she was at a facility that she feels provides good care for LT patients. Guardian familiar with process and agreeable to [facility] search.       Unk Lightning, LCSW (Coverage for [employer])

## 2014-07-14 NOTE — Progress Notes (Addendum)
PROGRESS NOTE    Lindsay Browning ZOX:[id number] DOB: 12-11-1918 DOA: 07/11/2014 PCP: Florentina Jenny, MD  HPI/Brief narrative 80 year old female patient, SNF resident, has DSS guardian, likely advanced dementia & nonverbal, hypertension, chronic kidney disease, sacral and right foot decubitus ulcers, transferred from nursing home secondary to progressive decrease in functioning, fatigue, weight loss, not eating. In the ED, found to be hypernatremic with sodium 157 and acute renal failure with creatinine 3.78. Patient DO NOT RESUSCITATE. Unable to get IV access>IO>PICC line.    Assessment/Plan:  1. Hypernatremic dehydration: Secondary to poor oral intake related to advanced dementia. Hypernatremia had resolved. Continue gentle IV fluids. N.p.o. >patient pocketing food in the mouth and high risk for aspiration. 2. Acute on stage III chronic kidney disease: Secondary to problem #1.Oliguric. Foley placed 9/5 for close intake and output monitoring secondary to incontinence. Creatinine has slowly improved but not as quickly as expected.? ATN. Renal ultrasound without hydronephrosis. Continue gentle IV fluids. 3. Mild hyperkalemia: Treat for acute renal failure as above and follow BMP closely. Kayexalate enema. Resolved. 4. Stage III sacral decubitus and right foot pressure ulcers: Wound care consultation-pending. 5. Advanced dementia: Mental status may be close to baseline. Was on regular diet at SNF and here initially felt able to tolerate medications with applesauce. Speech therapy evaluated patient and recommended dysphagia 1 diet and nectar thick liquids. Patient at high risk of aspiration secondary to advanced dementia. Patient pocketing food in the mouth-changed to n.p.o. Discussed with DSS guardian regarding clarification of GOC-she stated that she would speak with her director and get back to [place]. As per guardian, progressive MS decline since June. 6. Hypertension: Controlled. Continue beta  blockers. 7. Anemia: Hemoglobin dropped from 10.9 > 8.5, likely dilutional. Stable over last 24 hours. 8. Dysphagia: NPO   Code Status: DO NOT RESUSCITATE Family Communication: Discussed in detail with DSS guardian Ms. Dontad Woolard on 9/7-advised her in detail regarding patient's ongoing illness and care. Advised her regarding poor overall prognosis secondary to advanced age, advanced dementia and related poor oral intake leading to current dehydration and acute renal failure which will likely recur, dysphagia related to dementia and high risk for aspiration from by mouth intake. Requested clarification regarding continued aggressive care including tube feeding versus palliative care consultation for comfort oriented care. She stated that she would discuss with her director and get back to [place]. Left message for daughter Ms. Tennis Ship- only to update care (not allowed to make medical decisions) Disposition Plan: To be determined   Consultants:  None  Procedures:  IO line 9/5 > 9/5  Foley catheter-9/5 >  Antibiotics:  None   Subjective: Non verbal. As per nursing, patient pockets food and meds in the mouth.  Objective: Filed Vitals:   07/13/14 1826 07/13/14 2205 07/14/14 0500 07/14/14 1406  BP: 127/61 110/84 108/43 112/45  Pulse:  63 69 72  Temp:  98.2 F (36.8 C) 97.2 F (36.2 C) 97.7 F (36.5 C)  TempSrc:  Axillary Axillary Axillary  Resp:  Height:      Weight:      SpO2:  100% 99% 100%    Intake/Output Summary (Last 24 hours) at 07/14/14 1418 Last data filed at 07/14/14 1411  Gross per 24 hour  Intake 2366.25 ml  Output    725 ml  Net 1641.25 ml   Filed Weights   07/12/14 0900  Weight: 68.1 kg (150 lb 2.1 oz)     Exam:  General exam: Elderly frail  chronically ill-looking female lying comfortably supine in bed. Respiratory system: Poor inspiratory effort but seems clear to auscultation. No increased work of breathing. Cardiovascular system: S1 & S2  heard, RRR. No JVD, murmurs, gallops, clicks or pedal edema.  Gastrointestinal system: Abdomen is nondistended, soft and nontender. Normal bowel sounds heard. Central nervous system: Appears alert, mumbles incomprehensibly. Not oriented and does not follow instructions Extremities: Spontaneously moves upper > lower extremities. Thickening of skin/pressure damage over lateral aspect of right heel and ball of little toe. Skin: Stage III sacral decubitus without features suggest of acute infection.    Data Reviewed: Basic Metabolic Panel:  Recent Labs Lab 07/12/14 0914 07/12/14 1630 07/13/14 0409 07/13/14 1739 07/14/14 0525  NA 157* 161* 154* 144 142  K 5.5* 4.7 4.0 3.8 3.5*  CL 120* 122* 117* 110 107  CO2 GLUCOSE 123* 157* 236* 145* 103*  BUN 102* 102* 97* 88* 85*  CREATININE 3.60* 3.64* 3.38* 3.01* 2.96*  CALCIUM 8.8 9.0 8.7 8.4 8.3*  MG  --  2.9*  --   --   --   PHOS  --  5.1*  --   --   --    Liver Function Tests:  Recent Labs Lab 07/12/14 0914  AST 42*  ALT 11  ALKPHOS 69  BILITOT <0.2*  PROT 7.4  ALBUMIN 1.8*   No results found for this basename: LIPASE, AMYLASE,  in the last 168 hours No results found for this basename: AMMONIA,  in the last 168 hours CBC:  Recent Labs Lab 07/12/14 0054 07/13/14 0409 07/14/14 0525  WBC 12.2* 10.8* 7.8  NEUTROABS 8.7*  --   --   HGB 10.9* 8.5* 8.1*  HCT 36.1 28.7* 26.8*  MCV 97.0 97.0 95.0  PLT 256 183 186   Cardiac Enzymes: No results found for this basename: CKTOTAL, CKMB, CKMBINDEX, TROPONINI,  in the last 168 hours BNP (last 3 results) No results found for this basename: PROBNP,  in the last 8760 hours CBG: No results found for this basename: GLUCAP,  in the last 168 hours  Recent Results (from the past 240 hour(s))  MRSA PCR SCREENING     Status: Abnormal   Collection Time    07/12/14 11:01 AM      Result Value Ref Range Status   MRSA by PCR POSITIVE (*) NEGATIVE Final   Comment:             The GeneXpert MRSA Assay (FDA     approved for NASAL specimens     only), is one component of a     comprehensive MRSA colonization     surveillance program. It is not     intended to diagnose MRSA     infection nor to guide or     monitor treatment for     MRSA infections.     RESULT CALLED TO, READ BACK BY AND VERIFIED WITH:     Brynda Greathouse [id number] @ 1236 BY J SCOTTON       Studies: US Renal  07/14/2014   CLINICAL DATA:  Acute renal failure.  Evaluate for hydronephrosis.  EXAM: RENAL/URINARY TRACT ULTRASOUND COMPLETE  COMPARISON:  None.  FINDINGS: Right Kidney:  Length: 8.4 cm. Parenchymal echogenicity is increased. There is a 7.3 x 5.1 x 7.5 cm anechoic mass with increased through transmission off the upper pole. No solid lesion. No hydronephrosis.  Left Kidney:  Length: 7.4 cm. Parenchymal echogenicity is increased. No hydronephrosis. No  focal lesion.  Bladder:  Foley catheter is seen in a decompressed bladder.  IMPRESSION: 1. Small echogenic kidneys, indicative of chronic medical renal disease. 2. Large right renal cyst.   Electronically Signed   By: Leanna Battles M.D.   On: 07/14/2014 12:31        Scheduled Meds: . enoxaparin (LOVENOX) injection  30 mg Subcutaneous Q24H  . fentaNYL  12.5 mcg Transdermal Q72H  . FLUoxetine  10 mg Oral Daily  . LORazepam  0.5 mg Oral BID  . QUEtiapine  25 mg Oral BID  . senna-docusate  2 tablet Oral QHS  . sodium chloride  3 mL Intravenous Q12H   Continuous Infusions: . dextrose 75 mL/hr at 07/14/14 1101    Principal Problem:   Dehydration with hypernatremia Active Problems:   Sacral decubitus ulcer, stage III   Decubital ulcer   Hypertension   Unspecified hypothyroidism   Acute on chronic renal failure   Hyperkalemia    Time spent: 40 minutes.    Marcellus Scott, MD, FACP, FHM. [facility] Pager [phone]  If 7PM-7AM, please contact night-coverage [email or web address] Password [email or web address] 07/14/2014, 2:18 PM    LOS: 3  days

## 2014-07-15 DIAGNOSIS — L89109 Pressure ulcer of unspecified part of back, unspecified stage: Secondary | ICD-10-CM

## 2014-07-15 DIAGNOSIS — L8993 Pressure ulcer of unspecified site, stage 3: Secondary | ICD-10-CM

## 2014-07-15 LAB — CBC
HCT: 25.6 % — ABNORMAL LOW (ref 36.0–46.0)
HEMOGLOBIN: 7.9 g/dL — AB (ref 12.0–15.0)
MCH: 28.9 pg (ref 26.0–34.0)
MCHC: 30.9 g/dL (ref 30.0–36.0)
MCV: 93.8 fL (ref 78.0–100.0)
Platelets: 180 10*3/uL (ref 150–400)
RBC: 2.73 MIL/uL — ABNORMAL LOW (ref 3.87–5.11)
RDW: 13.8 % (ref 11.5–15.5)
WBC: 6.9 10*3/uL (ref 4.0–10.5)

## 2014-07-15 LAB — T4, FREE: FREE T4: 0.77 ng/dL — AB (ref 0.80–1.80)

## 2014-07-15 LAB — BASIC METABOLIC PANEL
ANION GAP: 14 (ref 5–15)
BUN: 77 mg/dL — ABNORMAL HIGH (ref 6–23)
CHLORIDE: 104 meq/L (ref 96–112)
CO2: 21 mEq/L (ref 19–32)
Calcium: 8.1 mg/dL — ABNORMAL LOW (ref 8.4–10.5)
Creatinine, Ser: 2.79 mg/dL — ABNORMAL HIGH (ref 0.50–1.10)
GFR calc Af Amer: 16 mL/min — ABNORMAL LOW (ref >90)
GFR calc non Af Amer: 14 mL/min — ABNORMAL LOW (ref >90)
Glucose, Bld: 103 mg/dL — ABNORMAL HIGH (ref 70–99)
POTASSIUM: 3.7 meq/L (ref 3.7–5.3)
SODIUM: 139 meq/L (ref 137–147)

## 2014-07-15 LAB — T3, FREE: T3 FREE: 1.8 pg/mL — AB (ref 2.3–4.2)

## 2014-07-15 MED ORDER — DEXTROSE 5 % IV SOLN
INTRAVENOUS | Status: AC
  Administered 2014-07-15 – 2014-07-16 (×2): via INTRAVENOUS

## 2014-07-15 NOTE — Care Management Note (Addendum)
    Page 1 of 2   07/24/2014     11:55:08 AM CARE MANAGEMENT NOTE 07/24/2014  Patient:  Lindsay Browning, Lindsay Browning   Account Number:  [id number]  Date Initiated:  07/15/2014  Documentation initiated by:  Lanier Clam  Subjective/Objective Assessment:   80 Y/O F ADMITTED W/DEHYDRATION,HYPERNATREMIA.EX:BMWUXLKG DEMENTIA.     Action/Plan:   FROM SNF.DSS-GUARDIAN.   Anticipated DC Date:  07/24/2014   Anticipated DC Plan:  SKILLED NURSING FACILITY  In-house referral  Clinical Social Worker      DC Planning Services  CM consult      Choice offered to / List presented to:             Status of service:  Completed, signed off Medicare Important Message given?  YES (If response is "NO", the following Medicare IM given date fields will be blank) Date Medicare IM given:  07/14/2014 Medicare IM given by:  [facility] Date Additional Medicare IM given:  07/23/2014 Additional Medicare IM given by:  Lanier Clam  Discharge Disposition:  SKILLED NURSING FACILITY  Per UR Regulation:  Reviewed for med. necessity/level of care/duration of stay  If discussed at Long Length of Stay Meetings, dates discussed:   07/17/2014  07/22/2014  07/24/2014    Comments:  07/24/14 Hades Mathew RN,BSN NCM [phone] D/C SNF.  07/23/14 Aashi Derrington RN,BSN NCM [phone] PEG PLACED TODAY.FOR D/C SNF.  07/22/14 Courtne Lighty RN,BSN,NCM [phone] FOR PEG TODAY,HGB 7.2-1U PRBC,OCCULT STOOL PENDING-MD ANTICIPATES D/C IN AM SNF.NEW SNF-[facility].  07/21/14 Quanell Loughney RN,BSN NCM [phone] PEG IN AM.D/C PLAN RETURN TO SNF.  07/18/14 Nekeshia Lenhardt RN,BSN NCM [phone] AWAIT RECOMMENDATIONS FROM PALLIATIVE AFTER GOC MEETING.  07/17/14 Liela Rylee RN,BSN NCM [phone] PALLIATIVE CONS IN AM FOR GOC?PEG.DSS GUARDIAN TO MEET IN AM.CURRENT D/C PLAN NEW SNF.  07/15/14 Miken Stecher RN,BSN NCM [phone] ST-NPO,POOR PO INTAKE,DNR.NOTED MD TRYING TO REACH DSS GUARDIAN FOR PALLIATIVE CONS.NOTED D/C PLAN CURRENTLY NEW SNF.SW  FOLLOWING.

## 2014-07-15 NOTE — Progress Notes (Signed)
CSW confirmed with Lawson Fiscal @ [facility] SNF that they would have a bed for patient when she's stable for discharge. DSS Guardian, Ms. Woodlard (cell#: [phone]) aware.   Lincoln Maxin, LCSW [facility] Clinical Social Worker cell #: [phone]

## 2014-07-15 NOTE — Progress Notes (Addendum)
Nutrition Note  Patient identified as Braden Score < 12. Currently NPO as pt has been pocketing food and high risk for aspiration. Plan of care currently pending to determine tube feeding/agressive care vs palliative for comfort oriented care.  Please consult if nutrition interventions needed pending GOC clarification.  Lloyd Huger MS RD LDN Clinical Dietitian [phone]

## 2014-07-15 NOTE — Progress Notes (Addendum)
PROGRESS NOTE    Lindsay Browning ZOX:[id number] DOB: [birth year] DOA: 07/11/2014 PCP: Florentina Jenny, MD  HPI/Brief narrative 80 year old female patient, SNF resident, has DSS guardian, likely advanced dementia & nonverbal, hypertension, chronic kidney disease, sacral and right foot decubitus ulcers, transferred from nursing home secondary to progressive decrease in functioning, fatigue, weight loss, not eating. In the ED, found to be hypernatremic with sodium 157 and acute renal failure with creatinine 3.78. Patient DO NOT RESUSCITATE. Unable to get IV access>IO>PICC line. Acute renal failure is improving. Poor candidate for by mouth intake secondary to high aspiration risk related to mental status changes and pocketing food in the mouth. Have discussed with DSS guardian and await further instructions re GOC- may decide for PEG tube despite advising them that this may not be the best option for her.   Assessment/Plan:  1. Hypernatremic dehydration: Secondary to poor oral intake related to advanced dementia. Hypernatremia had resolved. Continue gentle IV fluids. N.p.o. >patient pocketing food in the mouth and high risk for aspiration. 2. Acute on stage III chronic kidney disease: Secondary to problem #1.Oliguric. Foley placed 9/5 for close intake and output monitoring secondary to incontinence. Creatinine has slowly improved but not as quickly as expected.? ATN. Renal ultrasound without hydronephrosis. Continue gentle IV fluids. Urine output seems to be picking up. 3. Mild hyperkalemia: Treat for acute renal failure as above and follow BMP closely. Kayexalate enema. Resolved. 4. Stage III sacral decubitus and right foot pressure ulcers: Management per wound care consultation. 5. Advanced dementia: Mental status may be close to baseline. Was on regular diet at SNF and here initially felt able to tolerate medications with applesauce. Speech therapy evaluated patient and recommended dysphagia 1 diet and  nectar thick liquids. Patient at high risk of aspiration secondary to advanced dementia. Patient pocketing food in the mouth-changed to n.p.o. on 9/7. Discussed with DSS guardian [name] on 9/8-she will discuss with her supervisor and get back to [place] regarding further course of action-she stated that they may consider tube feeding. Advised her that tube feeding for this elderly frail patient with advanced dementia may not be the best option. It may prolong life but will not change prior poor quality of life and it has its own complications including pain, infection, bleeding and aspiration risks. Await call back. As per guardian, progressive MS decline since June 2015. 6. Hypertension: Controlled. Currently off medications. 7. Anemia: Hemoglobin dropped from 10.9 > 8.5, likely dilutional. Stable. 8. Dysphagia: NPO 9. Elevated TSH: Check Free T3 & T4   Code Status: DO NOT RESUSCITATE Family Communication: Discussed in detail with DSS guardian Ms. Dontad Woolard on 9/7 & 9/8-advised her in detail regarding patient's ongoing illness and care. Advised her regarding poor overall prognosis secondary to advanced age, advanced dementia and related poor oral intake leading to current dehydration and acute renal failure which will likely recur, dysphagia related to dementia and high risk for aspiration from by mouth intake. Requested clarification regarding continued aggressive care including tube feeding versus palliative care consultation for comfort oriented care. She stated that she would discuss with her director and get back to [place]. Left message for daughter Ms. Tennis Ship on 9/7- only to update care (not allowed to make medical decisions)- have not heard back. Disposition Plan: To be determined   Consultants:  None  Procedures:  IO line 9/5 > 9/5  Foley catheter-9/5 >  Antibiotics:  None   Subjective: Non verbal. Seems to be much more alert today.  Objective:  Filed Vitals:   07/14/14 1406  07/14/14 2100 07/15/14 0650 07/15/14 1257  BP: 112/45 106/44 110/43 112/46  Pulse: 72 75 72 69  Temp: 97.7 F (36.5 C) 97.5 F (36.4 C) 98 F (36.7 C) 97.6 F (36.4 C)  TempSrc: Axillary Axillary Axillary Axillary  Resp: Height:      Weight:      SpO2: 100% 100% 100% 100%    Intake/Output Summary (Last 24 hours) at 07/15/14 1532 Last data filed at 07/15/14 1435  Gross per 24 hour  Intake 1423.75 ml  Output    850 ml  Net 573.75 ml   Filed Weights   07/12/14 0900  Weight: 68.1 kg (150 lb 2.1 oz)     Exam:  General exam: Elderly frail chronically ill-looking female lying comfortably supine in bed. Respiratory system: Poor inspiratory effort but seems clear to auscultation. No increased work of breathing. Cardiovascular system: S1 & S2 heard, RRR. No JVD, murmurs, gallops, clicks or pedal edema.  Gastrointestinal system: Abdomen is nondistended, soft and nontender. Normal bowel sounds heard. Central nervous system: Appears alert and tracks activity around her eyes, mumbles incomprehensibly. Not oriented and does not follow instructions Extremities: Spontaneously moves upper > lower extremities-seems to have contractures. Thickening of skin/pressure damage over lateral aspect of right heel and ball of little toe. Skin: Stage III sacral decubitus without features suggest of acute infection.    Data Reviewed: Basic Metabolic Panel:  Recent Labs Lab 07/12/14 0914 07/12/14 1630 07/13/14 0409 07/13/14 1739 07/14/14 0525 07/15/14 0432  NA 157* 161* 154* 144 142 139  K 5.5* 4.7 4.0 3.8 3.5* 3.7  CL 120* 122* 117* 110 107 104  CO2 GLUCOSE 123* 157* 236* 145* 103* 103*  BUN 102* 102* 97* 88* 85* 77*  CREATININE 3.60* 3.64* 3.38* 3.01* 2.96* 2.79*  CALCIUM 8.8 9.0 8.7 8.4 8.3* 8.1*  MG  --  2.9*  --   --   --   --   PHOS  --  5.1*  --   --   --   --    Liver Function Tests:  Recent Labs Lab 07/12/14 0914  AST 42*  ALT 11  ALKPHOS  69  BILITOT <0.2*  PROT 7.4  ALBUMIN 1.8*   No results found for this basename: LIPASE, AMYLASE,  in the last 168 hours No results found for this basename: AMMONIA,  in the last 168 hours CBC:  Recent Labs Lab 07/12/14 0054 07/13/14 0409 07/14/14 0525 07/15/14 0432  WBC 12.2* 10.8* 7.8 6.9  NEUTROABS 8.7*  --   --   --   HGB 10.9* 8.5* 8.1* 7.9*  HCT 36.1 28.7* 26.8* 25.6*  MCV 97.0 97.0 95.0 93.8  PLT 256 183 186 180   Cardiac Enzymes: No results found for this basename: CKTOTAL, CKMB, CKMBINDEX, TROPONINI,  in the last 168 hours BNP (last 3 results) No results found for this basename: PROBNP,  in the last 8760 hours CBG: No results found for this basename: GLUCAP,  in the last 168 hours  Recent Results (from the past 240 hour(s))  MRSA PCR SCREENING     Status: Abnormal   Collection Time    07/12/14 11:01 AM      Result Value Ref Range Status   MRSA by PCR POSITIVE (*) NEGATIVE Final   Comment:            The GeneXpert MRSA Assay (FDA  approved for NASAL specimens     only), is one component of a     comprehensive MRSA colonization     surveillance program. It is not     intended to diagnose MRSA     infection nor to guide or     monitor treatment for     MRSA infections.     RESULT CALLED TO, READ BACK BY AND VERIFIED WITH:     Brynda Greathouse [id number] @ 1236 BY J SCOTTON       Studies: US Renal  07/14/2014   CLINICAL DATA:  Acute renal failure.  Evaluate for hydronephrosis.  EXAM: RENAL/URINARY TRACT ULTRASOUND COMPLETE  COMPARISON:  None.  FINDINGS: Right Kidney:  Length: 8.4 cm. Parenchymal echogenicity is increased. There is a 7.3 x 5.1 x 7.5 cm anechoic mass with increased through transmission off the upper pole. No solid lesion. No hydronephrosis.  Left Kidney:  Length: 7.4 cm. Parenchymal echogenicity is increased. No hydronephrosis. No focal lesion.  Bladder:  Foley catheter is seen in a decompressed bladder.  IMPRESSION: 1. Small echogenic kidneys,  indicative of chronic medical renal disease. 2. Large right renal cyst.   Electronically Signed   By: Leanna Battles M.D.   On: 07/14/2014 12:31        Scheduled Meds: . sennosides  10 mL Oral QHS   And  . docusate  100 mg Oral QHS  . enoxaparin (LOVENOX) injection  30 mg Subcutaneous Q24H  . fentaNYL  12.5 mcg Transdermal Q72H  . FLUoxetine  10 mg Oral Daily  . LORazepam  0.5 mg Oral BID  . sodium chloride  3 mL Intravenous Q12H   Continuous Infusions:    Principal Problem:   Dehydration with hypernatremia Active Problems:   Sacral decubitus ulcer, stage III   Decubital ulcer   Hypertension   Unspecified hypothyroidism   Acute on chronic renal failure   Hyperkalemia    Time spent: 30 minutes.    Marcellus Scott, MD, FACP, FHM. [facility] Pager [phone]  If 7PM-7AM, please contact night-coverage [email or web address] Password [email or web address] 07/15/2014, 3:32 PM    LOS: 4 days

## 2014-07-15 NOTE — Progress Notes (Signed)
Speech Language Pathology Treatment: Dysphagia  Patient Details Name: Lindsay Browning MRN: [id number] DOB: 07-23-1919 Today's Date: 07/15/2014 Time: 4098-1191 SLP Time Calculation (min): 19 min  Assessment / Plan / Recommendation Clinical Impression  Pt resting in bed. No family present. Pt is apparently a ward of the state. Discussions regarding goals of care are pending, per RN. Pt is currently NPO except medications, which RN reports pt is being given in liquid form via syringe. Caution was encouraged re: syringe use, as aspiration risk is very high if pt unable to cognitively manipulate oral bolus. BSE completed 07/12/14 recommended dys1 (puree) with nectar thick liquids when pt is awake, alert, and cognitively attending to task. Pt is poorly responsive at this time, so po trials were not attempted.  Poor po intake is anticipated, and will be highly variable at best. Information regarding continuing po intake with known risks of aspiration was left in pt room. RN informed.  ST to sign off at this time, as there is no need for skilled intervention aside from education, which has been completed with RN. Please reconsult if needs arise.   HPI HPI: 80 y.o. PMH: HTN, renal disorder admitted with progressive decrease in functioning increased fatigue, weight loss, stopped eating.  Found to be hypernatremic.  CXR no evidence of acute cardiopulmonary disease.  Per MD note pt. on regular diet at SNF and able to tolerate medications with applesauce. BSE completed 07/12/14 with recommendation for dys1/nectar when alert, NPO otherwise.   Pertinent Vitals Pain Assessment: Faces Faces Pain Scale: No hurt  SLP Plan       Recommendations Diet recommendations: NPO (unless awake, alert. Then dys1/full liquid as tolerated) Liquids provided via: Teaspoon Medication Administration: Other (Comment) (RN reported giving meds in liquid form via syringe) Supervision: Full supervision/cueing for compensatory strategies               Oral Care Recommendations: Oral care BID Follow up Recommendations: None    GO    [facility] [name] [phone]  Lindsay Browning 07/15/2014, 12:00 PM

## 2014-07-16 DIAGNOSIS — E039 Hypothyroidism, unspecified: Secondary | ICD-10-CM

## 2014-07-16 LAB — CBC
HCT: 25.4 % — ABNORMAL LOW (ref 36.0–46.0)
HEMOGLOBIN: 8 g/dL — AB (ref 12.0–15.0)
MCH: 28.6 pg (ref 26.0–34.0)
MCHC: 31.5 g/dL (ref 30.0–36.0)
MCV: 90.7 fL (ref 78.0–100.0)
Platelets: 206 10*3/uL (ref 150–400)
RBC: 2.8 MIL/uL — ABNORMAL LOW (ref 3.87–5.11)
RDW: 13.3 % (ref 11.5–15.5)
WBC: 7 10*3/uL (ref 4.0–10.5)

## 2014-07-16 LAB — RENAL FUNCTION PANEL
ANION GAP: 12 (ref 5–15)
Albumin: 1.6 g/dL — ABNORMAL LOW (ref 3.5–5.2)
BUN: 68 mg/dL — ABNORMAL HIGH (ref 6–23)
CO2: 21 mEq/L (ref 19–32)
Calcium: 8.1 mg/dL — ABNORMAL LOW (ref 8.4–10.5)
Chloride: 102 mEq/L (ref 96–112)
Creatinine, Ser: 2.52 mg/dL — ABNORMAL HIGH (ref 0.50–1.10)
GFR, EST AFRICAN AMERICAN: 18 mL/min — AB (ref >90)
GFR, EST NON AFRICAN AMERICAN: 15 mL/min — AB (ref >90)
GLUCOSE: 112 mg/dL — AB (ref 70–99)
PHOSPHORUS: 3.7 mg/dL (ref 2.3–4.6)
POTASSIUM: 3.6 meq/L — AB (ref 3.7–5.3)
SODIUM: 135 meq/L — AB (ref 137–147)

## 2014-07-16 MED ORDER — MUPIROCIN 2 % EX OINT
TOPICAL_OINTMENT | Freq: Two times a day (BID) | CUTANEOUS | Status: DC
  Administered 2014-07-17 – 2014-07-24 (×15): via NASAL
  Filled 2014-07-16: qty 22

## 2014-07-16 MED ORDER — CHLORHEXIDINE GLUCONATE CLOTH 2 % EX PADS
6.0000 | MEDICATED_PAD | Freq: Every day | CUTANEOUS | Status: DC
  Administered 2014-07-17 – 2014-07-24 (×8): 6 via TOPICAL

## 2014-07-16 MED ORDER — MUPIROCIN 2 % EX OINT: TOPICAL_OINTMENT | Freq: Two times a day (BID) | CUTANEOUS | Status: DC

## 2014-07-16 NOTE — Progress Notes (Signed)
Spoke to [employer], patient's daughter, who says that the patient was started on Prozac, which caused her to be lethargic and want that stopped. Will d/c Prozac. She wants to be involved in the goals of care meeting, phone [phone].

## 2014-07-16 NOTE — Progress Notes (Signed)
PROGRESS NOTE    Lindsay Browning AVW:[id number] DOB: [birth year] DOA: 07/11/2014 PCP: Florentina Jenny, MD  HPI/Brief narrative 80 year old female patient, SNF resident, has DSS guardian, likely advanced dementia & nonverbal, hypertension, chronic kidney disease, sacral and right foot decubitus ulcers, transferred from nursing home secondary to progressive decrease in functioning, fatigue, weight loss, not eating. In the ED, found to be hypernatremic with sodium 157 and acute renal failure with creatinine 3.78. Patient DO NOT RESUSCITATE. Unable to get IV access>IO>PICC line. Acute renal failure is improving. Poor candidate for by mouth intake secondary to high aspiration risk related to mental status changes and pocketing food in the mouth. Have discussed with DSS guardian and await further instructions re GOC- may decide for PEG tube despite advising them that this may not be the best option for her.   Assessment/Plan:  1. Hypernatremic dehydration: Secondary to poor oral intake related to advanced dementia. Hypernatremia had resolved. Continue gentle IV fluids. N.p.o. >patient pocketing food in the mouth and high risk for aspiration. 2. Acute on stage III chronic kidney disease: Secondary to problem #1.Oliguric. Foley placed 9/5 for close intake and output monitoring secondary to incontinence. Creatinine has slowly improved but not as quickly as expected.? ATN. Renal ultrasound without hydronephrosis. Continue gentle IV fluids. Urine output improving. 3. Mild hyperkalemia: Treat for acute renal failure as above and follow BMP closely. Kayexalate enema. Resolved. 4. Stage III sacral decubitus and right foot pressure ulcers: Management per wound care consultation. 5. Advanced dementia: Mental status may be close to baseline. Was on regular diet at SNF and here initially felt able to tolerate medications with applesauce. Speech therapy evaluated patient and recommended dysphagia 1 diet and nectar thick  liquids. Patient at high risk of aspiration secondary to advanced dementia. Patient pocketing food in the mouth-changed to n.p.o. on 9/7. Discussed with DSS guardian [name] on 9/8-she will discuss with her supervisor and get back to [place] regarding further course of action-she stated that they may consider tube feeding. Advised her that tube feeding for this elderly frail patient with advanced dementia may not be the best option. It may prolong life but will not change prior poor quality of life and it has its own complications including pain, infection, bleeding and aspiration risks. Await call back. As per guardian, progressive MS decline since June 2015. 6. Hypertension: Controlled. Currently off medications. 7. Anemia: Hemoglobin dropped from 10.9 > 8.5, likely dilutional. Stable. 8. Dysphagia: NPO 9. Elevated TSH:  F T4 0.77, F T3 1.8, Will need to repeat the thyroid studiers in 6-8 weeks   Code Status: DO NOT RESUSCITATE  Family Communication: Dr Waymon Amato  discussed in detail with DSS guardian Ms. Dontad Woolard on 9/7 & 9/8-advised her in detail regarding patient's ongoing illness and care. Advised her regarding poor overall prognosis secondary to advanced age, advanced dementia and related poor oral intake leading to current dehydration and acute renal failure which will likely recur, dysphagia related to dementia and high risk for aspiration from by mouth intake. Requested clarification regarding continued aggressive care including tube feeding versus palliative care consultation for comfort oriented care. She stated that she would discuss with her director and get back to [place] Again cal;led today and I discussed with Ms Danna Hefty, and she told me that she is aware of the situation and would get back to [place] after she discuss with the director.  Disposition Plan: To be determined   Consultants:  None  Procedures:  IO line 9/5 >  9/5  Foley catheter-9/5 >  Antibiotics:  None    Subjective: Patient seen and examined, nonverbal.  Objective: Filed Vitals:   07/15/14 1257 07/15/14 2230 07/16/14 0509 07/16/14 0635  BP: 112/46 102/58 96/31 110/55  Pulse: 69 65 73 75  Temp: 97.6 F (36.4 C) 97.5 F (36.4 C) 97.5 F (36.4 C)   TempSrc: Axillary Axillary Axillary   Resp: Height:      Weight:      SpO2: 100% 100% 100%     Intake/Output Summary (Last 24 hours) at 07/16/14 1251 Last data filed at 07/16/14 0600  Gross per 24 hour  Intake    995 ml  Output    800 ml  Net    195 ml   Filed Weights   07/12/14 0900  Weight: 68.1 kg (150 lb 2.1 oz)     Exam:  Physical Exam: Head: Normocephalic, atraumatic.  Eyes: No signs of jaundice, EOMI Nose: Mucous membranes dry.  Lungs: Normal respiratory effort. B/L Clear to auscultation, no crackles or wheezes.  Heart: Regular RR. S1 and S2 normal  Abdomen: BS normoactive. Soft, Nondistended, non-tender.  Extremities: No pretibial edema, no erythema     Data Reviewed: Basic Metabolic Panel:  Recent Labs Lab 07/12/14 0914 07/12/14 1630 07/13/14 0409 07/13/14 1739 07/14/14 0525 07/15/14 0432 07/16/14 0355  NA 157* 161* 154* 144 142 139 135*  K 5.5* 4.7 4.0 3.8 3.5* 3.7 3.6*  CL 120* 122* 117* 110 107 104 102  CO2 GLUCOSE 123* 157* 236* 145* 103* 103* 112*  BUN 102* 102* 97* 88* 85* 77* 68*  CREATININE 3.60* 3.64* 3.38* 3.01* 2.96* 2.79* 2.52*  CALCIUM 8.8 9.0 8.7 8.4 8.3* 8.1* 8.1*  MG  --  2.9*  --   --   --   --   --   PHOS  --  5.1*  --   --   --   --  3.7   Liver Function Tests:  Recent Labs Lab 07/12/14 0914 07/16/14 0355  AST 42*  --   ALT 11  --   ALKPHOS 69  --   BILITOT <0.2*  --   PROT 7.4  --   ALBUMIN 1.8* 1.6*   No results found for this basename: LIPASE, AMYLASE,  in the last 168 hours No results found for this basename: AMMONIA,  in the last 168 hours CBC:  Recent Labs Lab 07/12/14 0054 07/13/14 0409 07/14/14 0525 07/15/14 0432  07/16/14 0355  WBC 12.2* 10.8* 7.8 6.9 7.0  NEUTROABS 8.7*  --   --   --   --   HGB 10.9* 8.5* 8.1* 7.9* 8.0*  HCT 36.1 28.7* 26.8* 25.6* 25.4*  MCV 97.0 97.0 95.0 93.8 90.7  PLT 256 183 186 180 206   Cardiac Enzymes: No results found for this basename: CKTOTAL, CKMB, CKMBINDEX, TROPONINI,  in the last 168 hours BNP (last 3 results) No results found for this basename: PROBNP,  in the last 8760 hours CBG: No results found for this basename: GLUCAP,  in the last 168 hours  Recent Results (from the past 240 hour(s))  MRSA PCR SCREENING     Status: Abnormal   Collection Time    07/12/14 11:01 AM      Result Value Ref Range Status   MRSA by PCR POSITIVE (*) NEGATIVE Final   Comment:            The GeneXpert  MRSA Assay (FDA     approved for NASAL specimens     only), is one component of a     comprehensive MRSA colonization     surveillance program. It is not     intended to diagnose MRSA     infection nor to guide or     monitor treatment for     MRSA infections.     RESULT CALLED TO, READ BACK BY AND VERIFIED WITH:     Brynda Greathouse [id number] @ 1236 BY J SCOTTON       Studies: No results found.      Scheduled Meds: . sennosides  10 mL Oral QHS   And  . docusate  100 mg Oral QHS  . enoxaparin (LOVENOX) injection  30 mg Subcutaneous Q24H  . fentaNYL  12.5 mcg Transdermal Q72H  . FLUoxetine  10 mg Oral Daily  . LORazepam  0.5 mg Oral BID  . sodium chloride  3 mL Intravenous Q12H   Continuous Infusions: . dextrose 75 mL/hr at 07/16/14 0557    Principal Problem:   Dehydration with hypernatremia Active Problems:   Sacral decubitus ulcer, stage III   Decubital ulcer   Hypertension   Unspecified hypothyroidism   Acute on chronic renal failure   Hyperkalemia    Time spent: 30 minutes.    Meredeth Ide, [facility] Pager [phone]  If 7PM-7AM, please contact night-coverage [email or web address] Password [email or web address] 07/16/2014, 12:51 PM    LOS: 5 days

## 2014-07-16 NOTE — Progress Notes (Signed)
Spoke to Catering manager at [employer], [name] [phone]  and she would like further discussion with palliative care regarding patient's long-term nutrition needs. Wants to discuss PEG tube placement.

## 2014-07-17 NOTE — Plan of Care (Signed)
Problem: Phase II Progression Outcomes Goal: Progress activity as tolerated unless otherwise ordered Outcome: Not Met (add Reason) Patient is having a Palliative care consult

## 2014-07-17 NOTE — Progress Notes (Signed)
PROGRESS NOTE    Lindsay Browning WUJ:[id number] DOB: Jul 23, 1919 DOA: 07/11/2014 PCP: Florentina Jenny, MD  HPI/Brief narrative 80 year old female patient, SNF resident, has DSS guardian, likely advanced dementia & nonverbal, hypertension, chronic kidney disease, sacral and right foot decubitus ulcers, transferred from nursing home secondary to progressive decrease in functioning, fatigue, weight loss, not eating. In the ED, found to be hypernatremic with sodium 157 and acute renal failure with creatinine 3.78. Patient DO NOT RESUSCITATE. Unable to get IV access>IO>PICC line. Acute renal failure is improving. Poor candidate for by mouth intake secondary to high aspiration risk related to mental status changes and pocketing food in the mouth. Have discussed with DSS guardian and await further instructions re GOC- may decide for PEG tube despite advising them that this may not be the best option for her.   Assessment/Plan:  1. Hypernatremic dehydration: Secondary to poor oral intake related to advanced dementia. Hypernatremia had resolved. Continue gentle IV fluids. N.p.o. >patient pocketing food in the mouth and high risk for aspiration. 2. Acute on stage III chronic kidney disease: Secondary to problem #1.Oliguric. Foley placed 9/5 for close intake and output monitoring secondary to incontinence. Creatinine has slowly improved but not as quickly as expected.? ATN. Renal ultrasound without hydronephrosis. Continue gentle IV fluids. Urine output improving. 3. Mild hyperkalemia: Treat for acute renal failure as above and follow BMP closely. Kayexalate enema. Resolved. 4. Stage III sacral decubitus and right foot pressure ulcers: Management per wound care consultation. 5. Advanced dementia: Mental status may be close to baseline. Was on regular diet at SNF and here initially felt able to tolerate medications with applesauce. Speech therapy evaluated patient and recommended dysphagia 1 diet and nectar thick  liquids. Patient at high risk of aspiration secondary to advanced dementia. Patient pocketing food in the mouth-changed to n.p.o. on 9/7. Discussed with DSS guardian [name] on 9/8-she will discuss with her supervisor and get back to [place] regarding further course of action-she stated that they may consider tube feeding. Advised her that tube feeding for this elderly frail patient with advanced dementia may not be the best option. It may prolong life but will not change prior poor quality of life and it has its own complications including pain, infection, bleeding and aspiration risks. Await call back. As per guardian, progressive MS decline since June 2015. 6. Hypertension: Controlled. Currently off medications. 7. Anemia: Hemoglobin dropped from 10.9 > 8.5, likely dilutional. Stable. 8. Dysphagia: NPO 9. Elevated TSH:  F T4 0.77, F T3 1.8, Will need to repeat the thyroid studiers in 6-8 weeks   Code Status: DO NOT RESUSCITATE  Family Communication: Dr Waymon Amato  discussed in detail with DSS guardian Ms. Dontad Woolard on 9/7 & 9/8-advised her in detail regarding patient's ongoing illness and care. Advised her regarding poor overall prognosis secondary to advanced age, advanced dementia and related poor oral intake leading to current dehydration and acute renal failure which will likely recur, dysphagia related to dementia and high risk for aspiration from by mouth intake. Requested clarification regarding continued aggressive care including tube feeding versus palliative care consultation for comfort oriented care. She stated that she would discuss with her director and get back to [place] I called and discussed with DSS director [name], who is willing to get palliative care consultation for goals of care meeting. As per the notes the meeting was set for 07/18/2014 at 10:30 AM.  Disposition Plan: To be determined   Consultants:  None  Procedures:  IO  line 9/5 > 9/5  Foley catheter-9/5  >  Antibiotics:  None   Subjective: Patient seen and examined, nonverbal.  Objective: Filed Vitals:   07/16/14 0635 07/16/14 1515 07/16/14 2125 07/17/14 0610  BP: 110/55 116/74 111/52 110/60  Pulse: 75 78 78 75  Temp:  97.4 F (36.3 C) 98.3 F (36.8 C) 98 F (36.7 C)  TempSrc:  Axillary Axillary Axillary  Resp:   18 18  Height:      Weight:      SpO2:  100% 100% 100%    Intake/Output Summary (Last 24 hours) at 07/17/14 1116 Last data filed at 07/17/14 1000  Gross per 24 hour  Intake      0 ml  Output   1500 ml  Net  -1500 ml   Filed Weights   07/12/14 0900  Weight: 68.1 kg (150 lb 2.1 oz)     Exam:  Physical Exam: Head: Normocephalic, atraumatic.  Eyes: No signs of jaundice, EOMI Nose: Mucous membranes dry.  Lungs: Normal respiratory effort. B/L Clear to auscultation, no crackles or wheezes.  Heart: Regular RR. S1 and S2 normal  Abdomen: BS normoactive. Soft, Nondistended, non-tender.  Extremities: No pretibial edema, no erythema     Data Reviewed: Basic Metabolic Panel:  Recent Labs Lab 07/12/14 0914 07/12/14 1630 07/13/14 0409 07/13/14 1739 07/14/14 0525 07/15/14 0432 07/16/14 0355  NA 157* 161* 154* 144 142 139 135*  K 5.5* 4.7 4.0 3.8 3.5* 3.7 3.6*  CL 120* 122* 117* 110 107 104 102  CO2 GLUCOSE 123* 157* 236* 145* 103* 103* 112*  BUN 102* 102* 97* 88* 85* 77* 68*  CREATININE 3.60* 3.64* 3.38* 3.01* 2.96* 2.79* 2.52*  CALCIUM 8.8 9.0 8.7 8.4 8.3* 8.1* 8.1*  MG  --  2.9*  --   --   --   --   --   PHOS  --  5.1*  --   --   --   --  3.7   Liver Function Tests:  Recent Labs Lab 07/12/14 0914 07/16/14 0355  AST 42*  --   ALT 11  --   ALKPHOS 69  --   BILITOT <0.2*  --   PROT 7.4  --   ALBUMIN 1.8* 1.6*   No results found for this basename: LIPASE, AMYLASE,  in the last 168 hours No results found for this basename: AMMONIA,  in the last 168 hours CBC:  Recent Labs Lab 07/12/14 0054 07/13/14 0409  07/14/14 0525 07/15/14 0432 07/16/14 0355  WBC 12.2* 10.8* 7.8 6.9 7.0  NEUTROABS 8.7*  --   --   --   --   HGB 10.9* 8.5* 8.1* 7.9* 8.0*  HCT 36.1 28.7* 26.8* 25.6* 25.4*  MCV 97.0 97.0 95.0 93.8 90.7  PLT 256 183 186 180 206   Cardiac Enzymes: No results found for this basename: CKTOTAL, CKMB, CKMBINDEX, TROPONINI,  in the last 168 hours BNP (last 3 results) No results found for this basename: PROBNP,  in the last 8760 hours CBG: No results found for this basename: GLUCAP,  in the last 168 hours  Recent Results (from the past 240 hour(s))  MRSA PCR SCREENING     Status: Abnormal   Collection Time    07/12/14 11:01 AM      Result Value Ref Range Status   MRSA by PCR POSITIVE (*) NEGATIVE Final   Comment:  The GeneXpert MRSA Assay (FDA     approved for NASAL specimens     only), is one component of a     comprehensive MRSA colonization     surveillance program. It is not     intended to diagnose MRSA     infection nor to guide or     monitor treatment for     MRSA infections.     RESULT CALLED TO, READ BACK BY AND VERIFIED WITH:     Brynda Greathouse [id number] @ 1236 BY J SCOTTON       Studies: No results found.      Scheduled Meds: . Chlorhexidine Gluconate Cloth  6 each Topical Q0600  . sennosides  10 mL Oral QHS   And  . docusate  100 mg Oral QHS  . enoxaparin (LOVENOX) injection  30 mg Subcutaneous Q24H  . fentaNYL  12.5 mcg Transdermal Q72H  . LORazepam  0.5 mg Oral BID  . mupirocin ointment   Nasal BID  . sodium chloride  3 mL Intravenous Q12H   Continuous Infusions:    Principal Problem:   Dehydration with hypernatremia Active Problems:   Sacral decubitus ulcer, stage III   Decubital ulcer   Hypertension   Unspecified hypothyroidism   Acute on chronic renal failure   Hyperkalemia    Time spent: 30 minutes.    Meredeth Ide, [facility] Pager [phone]  If 7PM-7AM, please contact night-coverage [email or web address] Password  [email or web address] 07/17/2014, 11:16 AM    LOS: 6 days

## 2014-07-17 NOTE — Progress Notes (Signed)
Patient ZO:XWRUEAV C Stinnette      DOB: 05-24-1919      WUJ:[id number]  Consult received for PMT goals of care meeting.  I have left a message on [name]' number to coordinate a time to meet with the patient's family.    Will reupdate note when time established.   Manasvini Whatley L. Ladona Ridgel, MD MBA The Palliative Medicine Team at [facility] Phone: [phone] Pager: [phone] ( Use team phone after hours)

## 2014-07-17 NOTE — Progress Notes (Signed)
Patient ZO:XWRUEAV C Gelder      DOB: 1919-09-19      WUJ:[id number]  [name] [phone]) is making herself available at 1030 am Friday.  Will call family and request their presence.   Kule Gascoigne L. Ladona Ridgel, MD MBA The Palliative Medicine Team at [facility] Phone: [phone] Pager: [phone] ( Use team phone after hours)

## 2014-07-18 ENCOUNTER — Inpatient Hospital Stay (HOSPITAL_COMMUNITY): Payer: Medicare Other

## 2014-07-18 DIAGNOSIS — Z515 Encounter for palliative care: Secondary | ICD-10-CM

## 2014-07-18 DIAGNOSIS — E43 Unspecified severe protein-calorie malnutrition: Secondary | ICD-10-CM | POA: Insufficient documentation

## 2014-07-18 NOTE — Consult Note (Signed)
Patient Lindsay Browning      DOB: 02-Dec-1918      HYQ:[id number]     Consult Note from the Palliative Medicine Team at [facility]    Consult Requested by: Dr Sharl Ma     PCP: Florentina Jenny, MD Reason for Consultation:  Clarification of GOC and options     Phone Number:[phone]  Assessment of patients Current state: Continued physical, functional and cognitive decline 2/2 to ES-dementia and overall failure to thrive.  DSS is guardian for this patient and they are faced with advanced directive decisions and anticipatory care needs.  Consult is for review of medical treatment options, clarification of goals of care and end of life issues, disposition and options, and symptom recommendation.  This NP Lorinda Creed reviewed medical records, received report from team, assessed the patient and then meet at the patient's bedside along with [facility]/leagal guardian (Lindsay Browning family member participated vis phone conference)  to discuss diagnosis prognosis, GOC, EOL wishes disposition and options.  A detailed discussion was had today regarding advanced directives.  Concepts specific to code status, artifical feeding and hydration, continued IV antibiotics and rehospitalization was had.  The difference between a aggressive medical intervention path  and a palliative comfort care path for this patient at this time was had.  Values and goals of care important to patient and family were attempted to be elicited.  Concept of Hospice and Palliative Care were discussed  Natural trajectory and expectations at EOL were discussed.  Questions and concerns addressed.  Hard Choices booklet left for review. Family encouraged to call with questions or concerns.  PMT will continue to support holistically.  MOST from completed     Goals of Care: 1.  Code Status:  DNR/DNI   2. Scope of Treatment:  Decision is to move forward with PEG placement in hopes of improvement.  All verbalize an understanding of  her limited life expectancy but "just want to give her a chance".  We discussed the importance of putting a time frame for continuation of artifical feeding.  Lindsay Earlene Plater plans to re-eva lute in 2 months  the  effectiveness of feeding tube as it relates to her quality of life.   1. Vital Signs: per unit  2. Respiratory/Oxygen: as needed 3. Nutritional Support/Tube Feeds:   PEG placement desired 4. Antibiotics: yes 5. IVF: yes 6. Labs: yes  3. Disposition: Back to a SNF (requesting different placement than [facility]) when medically stable   4. Symptom Management:   1.  Dysphagia:  Evaluate for PEG placement.    5. Psychosocial:  Emotional support and education offered to guardian and family member regarding the natural trajectory and expectations of dementia and at  EOL.       Patient Documents Completed or Given: Document Given Completed  Advanced Directives Pkt    MOST  yes  DNR  yes  Gone from My Sight    Hard Choices yes     Brief HPI:  80 year old female patient, SNF resident, has DSS guardian, likely advanced dementia & nonverbal, hypertension, chronic kidney disease, sacral and right foot decubitus ulcers, transferred from nursing home secondary to progressive decrease in functioning, fatigue, weight loss, not eating. In the ED, found to be hypernatremic with sodium 157 and acute renal failure with creatinine 3.78. Patient DO NOT RESUSCITATE. Unable to get IV access>IO>PICC line. Acute renal failure is improving. Poor candidate for by mouth intake secondary to high aspiration risk related to mental status changes  and pocketing food in the mouth.  Overall failure to thrive.    ROS: unable to ilicit due to cognitive deficit   PMH:  Past Medical History  Diagnosis Date  . Medical history unknown   . Hypertension   . Renal disorder      PSH: Past Surgical History  Procedure Laterality Date  . Surgical history unknown    . Cardiac surgery      cath  . Foot surgery      I have reviewed the FH and SH and  If appropriate update it with new information. Allergies  Allergen Reactions  . Penicillins Other (See Comments)    Unknown - pt states she's never taken PCN before, per [facility]   Scheduled Meds: . Chlorhexidine Gluconate Cloth  6 each Topical Q0600  . sennosides  10 mL Oral QHS   And  . docusate  100 mg Oral QHS  . enoxaparin (LOVENOX) injection  30 mg Subcutaneous Q24H  . fentaNYL  12.5 mcg Transdermal Q72H  . LORazepam  0.5 mg Oral BID  . mupirocin ointment   Nasal BID  . sodium chloride  3 mL Intravenous Q12H   Continuous Infusions:  PRN Meds:.acetaminophen, ondansetron (ZOFRAN) IV, sodium chloride    BP 128/73  Pulse 81  Temp(Src) 98.5 F (36.9 C) (Axillary)  Resp 20  Ht  (1.651 m)  Wt 68.1 kg (150 lb 2.1 oz)  BMI 24.98 kg/m2  SpO2 100%   PPS: 20 %   Intake/Output Summary (Last 24 hours) at 07/18/14 1645 Last data filed at 07/18/14 1500  Gross per 24 hour  Intake      0 ml  Output   1100 ml  Net  -1100 ml    Physical Exam:  General: chronically ill appearing, non communicative, unable to follow commands HEENT: moist buccal membranes, no exudate Chest:   Decreased in bases  CTA CVS: RRR Abdomen: soft +BS Ext: without edema Neuro: non communicative, moves all four extremities  Labs: CBC    Component Value Date/Time   WBC 7.0 07/16/2014 0355   RBC 2.80* 07/16/2014 0355   HGB 8.0* 07/16/2014 0355   HCT 25.4* 07/16/2014 0355   PLT 206 07/16/2014 0355   MCV 90.7 07/16/2014 0355   MCH 28.6 07/16/2014 0355   MCHC 31.5 07/16/2014 0355   RDW 13.3 07/16/2014 0355   LYMPHSABS 1.5 07/12/2014 0054   MONOABS 1.0 07/12/2014 0054   EOSABS 0.1 07/12/2014 0054   BASOSABS 0.1 07/12/2014 0054    BMET    Component Value Date/Time   NA 135* 07/16/2014 0355   K 3.6* 07/16/2014 0355   CL 102 07/16/2014 0355   CO2 21 07/16/2014 0355   GLUCOSE 112* 07/16/2014 0355   BUN 68* 07/16/2014 0355   CREATININE 2.52* 07/16/2014 0355   CALCIUM 8.1* 07/16/2014 0355    GFRNONAA 15* 07/16/2014 0355   GFRAA 18* 07/16/2014 0355    CMP     Component Value Date/Time   NA 135* 07/16/2014 0355   K 3.6* 07/16/2014 0355   CL 102 07/16/2014 0355   CO2 21 07/16/2014 0355   GLUCOSE 112* 07/16/2014 0355   BUN 68* 07/16/2014 0355   CREATININE 2.52* 07/16/2014 0355   CALCIUM 8.1* 07/16/2014 0355   PROT 7.4 07/12/2014 0914   ALBUMIN 1.6* 07/16/2014 0355   AST 42* 07/12/2014 0914   ALT 11 07/12/2014 0914   ALKPHOS 69 07/12/2014 0914   BILITOT <0.2* 07/12/2014 0914   GFRNONAA 15* 07/16/2014  0355   GFRAA 18* 07/16/2014 0355     Time In Time Out Total Time Spent with Patient Total Overall Time  1030 1200 80 min 90 min    Greater than 50%  of this time was spent counseling and coordinating care related to the above assessment and plan.   Lorinda Creed NP  Palliative Medicine Team Team Phone # [phone] Pager [phone]  Discussed with Dr Sharl Ma

## 2014-07-18 NOTE — Progress Notes (Signed)
INITIAL NUTRITION ASSESSMENT  DOCUMENTATION CODES Per approved criteria  -Severe malnutrition in the context of chronic illness  Pt meets criteria for severe MALNUTRITION in the context of chronic illness as evidenced by 19% body weight loss in 3 months, severe and moderate muscle wasting and subcutaneous fat loss; likely PO intake < 75% for > one month   INTERVENTION: -As warranted:  -Initiate Osmolite 1.2 @ 20 ml/hr via PEG and increase by 10 ml  every 8 hours to goal rate of 60 ml/hr. Tube feeding regimen  provides 1728 kcal (100% of needs), 80 grams of protein, and  1181 ml of H2O. Pt will need additional 600-800 ml free water   -Recommend refeeding labs 24-48 hours upon initiation of  TF; can increase advancement rate as warranted -RD to continue to monitor   NUTRITION DIAGNOSIS: Inadequate oral intake related to inability to eat as evidenced by NPO status.   Goal: TF to meet >/= 90% of their estimated nutrition needs    Monitor:  TF order, total protein/energy intake, labs, weights, skin integrity  Reason for Assessment: Low Braden  80 y.o. female  Admitting Dx: Dehydration with hypernatremia  ASSESSMENT: Patient resides at [facility]. She is nonverbal. Per staff patient had had progressive decrease in functioning increased fatigue, weight loss, stopped eating and was brought in to emerge department.   9/08: Patient identified as Braden Score < 12. Currently NPO as pt has been pocketing food and high risk for aspiration. Plan of care currently pending to determine tube feeding/agressive care vs palliative for comfort oriented care.   9/11: -GOC meeting occurred today to determine further course of action. Per discussion with RN, it is likely PEG tube will be placed. RD will place recommendations to use as warranted. Expressed concern for risk of refeeding as pt with hx of weight loss, and prolonged period of minimal nutritional intake (0% PO intake for > one week) -Pt  nonverbal and unable to provide food/nutrition hx. Per previous medical records, pt has lost approximately 35 lbs in past 3 months (19% body weight loss, severe for time frame) likely d/t decreased intake reported from SNF -WOC evaluated pt on 9/06 for stage III PU on bilateral buttocks, and unstageable PU on left heel -Phos WNL, K Low. Recommend refeeding labs for 24-48 hrs upon initiation of TF, and increase advancement rate of TF as warranted  Nutrition Focused Physical Exam:  Subcutaneous Fat:  Orbital Region: WDL Upper Arm Region: severe Thoracic and Lumbar Region: n/a  Muscle:  Temple Region: moderate Clavicle Bone Region: moderate Clavicle and Acromion Bone Region: moderate Scapular Bone Region: n/a Dorsal Hand: n/a Patellar Region:severe Anterior Thigh Region: n/a Posterior Calf Region: severe  Edema: +1 RLE edema,  +1 LLE edema    Height: Ht Readings from Last 1 Encounters:  07/12/14  (1.651 m)    Weight: Wt Readings from Last 1 Encounters:  07/12/14 150 lb 2.1 oz (68.1 kg)    Ideal Body Weight: 125 lbs  % Ideal Body Weight: 120%  Wt Readings from Last 10 Encounters:  07/12/14 150 lb 2.1 oz (68.1 kg)  04/14/14 190 lb (86.183 kg)  04/07/14 185 lb (83.915 kg)  03/27/14 185 lb (83.915 kg)  03/05/14 185 lb (83.915 kg)  11/22/13 193 lb (87.544 kg)    Usual Body Weight: 185 lb per previous medical records  % Usual Body Weight: 81%  BMI:  Body mass index is 24.98 kg/(m^2).  Estimated Nutritional Needs: Kcal: 1500-1700 Protein: 80-90 gram  Fluid: >/=1700 ml/daily  Skin: stg 3 bilateral PU on buttock, unstageable heel ulcer  Diet Order: NPO  EDUCATION NEEDS: -No education needs identified at this time   Intake/Output Summary (Last 24 hours) at 07/18/14 1418 Last data filed at 07/18/14 0453  Gross per 24 hour  Intake      0 ml  Output    800 ml  Net   -800 ml    Last BM: 9/10  Labs:   Recent Labs Lab 07/12/14 0914 07/12/14 1630   07/14/14 0525 07/15/14 0432 07/16/14 0355  NA 157* 161*  < > 142 139 135*  K 5.5* 4.7  < > 3.5* 3.7 3.6*  CL 120* 122*  < > 107 104 102  CO2 19 22  < > BUN 102* 102*  < > 85* 77* 68*  CREATININE 3.60* 3.64*  < > 2.96* 2.79* 2.52*  CALCIUM 8.8 9.0  < > 8.3* 8.1* 8.1*  MG  --  2.9*  --   --   --   --   PHOS  --  5.1*  --   --   --  3.7  GLUCOSE 123* 157*  < > 103* 103* 112*  < > = values in this interval not displayed.  CBG (last 3)  No results found for this basename: GLUCAP,  in the last 72 hours  Scheduled Meds: . Chlorhexidine Gluconate Cloth  6 each Topical Q0600  . sennosides  10 mL Oral QHS   And  . docusate  100 mg Oral QHS  . enoxaparin (LOVENOX) injection  30 mg Subcutaneous Q24H  . fentaNYL  12.5 mcg Transdermal Q72H  . LORazepam  0.5 mg Oral BID  . mupirocin ointment   Nasal BID  . sodium chloride  3 mL Intravenous Q12H    Continuous Infusions:   Past Medical History  Diagnosis Date  . Medical history unknown   . Hypertension   . Renal disorder     Past Surgical History  Procedure Laterality Date  . Surgical history unknown    . Cardiac surgery      cath  . Foot surgery      Lloyd Huger MS RD LDN Clinical Dietitian [phone]

## 2014-07-18 NOTE — Progress Notes (Signed)
Patient ID: Lindsay Browning, female   DOB: [birth year], 80 y.o.   MRN: [id number] Request received for perc gastrostomy tube placement in pt. Hx noted. No prior imaging of abdomen present for review. Case d/w Dr. Deanne Coffer and he recommends limited CT abdomen for further evaluation prior to G tube placement. Will cont to monitor.

## 2014-07-18 NOTE — Progress Notes (Signed)
PROGRESS NOTE    Lindsay Browning BHA:[id number] DOB: 09/18/1919 DOA: 07/11/2014 PCP: [name], MD  HPI/Brief narrative 80 year old female patient, SNF resident, has DSS guardian, likely advanced dementia & nonverbal, hypertension, chronic kidney disease, sacral and right foot decubitus ulcers, transferred from nursing home secondary to progressive decrease in functioning, fatigue, weight loss, not eating. In the ED, found to be hypernatremic with sodium 157 and acute renal failure with creatinine 3.78. Patient DO NOT RESUSCITATE. Unable to get IV access>IO>PICC line. Acute renal failure is improving. Poor candidate for by mouth intake secondary to high aspiration risk related to mental status changes and pocketing food in the mouth. Have discussed with DSS guardian and await further instructions re GOC- may decide for PEG tube despite advising them that this may not be the best option for her.   Assessment/Plan:  1. Hypernatremic dehydration: Secondary to poor oral intake related to advanced dementia. Hypernatremia had resolved. Continue gentle IV fluids. N.p.o. >patient pocketing food in the mouth and high risk for aspiration. 2. Acute on stage III chronic kidney disease: Secondary to problem #1.Oliguric. Foley placed 9/5 for close intake and output monitoring secondary to incontinence. Creatinine has slowly improved but not as quickly as expected.? ATN. Renal ultrasound without hydronephrosis. Continue gentle IV fluids. Urine output improving. 3. Mild hyperkalemia: Treat for acute renal failure as above and follow BMP closely. Kayexalate enema. Resolved. 4. Stage III sacral decubitus and right foot pressure ulcers: Management per wound care consultation. 5. Advanced dementia: Mental status may be close to baseline. Was on regular diet at SNF and here initially felt able to tolerate medications with applesauce. Speech therapy evaluated patient and recommended dysphagia 1 diet and nectar thick  liquids. Patient at high risk of aspiration secondary to advanced dementia. Patient pocketing food in the mouth-changed to n.p.o. on 9/7. Discussed with DSS guardian [name] on 9/8-she will discuss with her supervisor and get back to [place] regarding further course of action-she stated that they may consider tube feeding. Advised her that tube feeding for this elderly frail patient with advanced dementia may not be the best option. It may prolong life but will not change prior poor quality of life and it has its own complications including pain, infection, bleeding and aspiration risks. Await call back. As per guardian, progressive MS decline since June 2015. 6. Hypertension: Controlled. Currently off medications. 7. Anemia: Hemoglobin dropped from 10.9 > 8.5, likely dilutional. Stable. 8. Dysphagia: NPO, will get IR consult for peg tube placement. 9. Elevated TSH:  F T4 0.77, F T3 1.8, Will need to repeat the thyroid studiers in 6-8 weeks   Code Status: DO NOT RESUSCITATE  Family Communication: Dr Algis Liming  discussed in detail with DSS guardian Ms. Dontad Woolard on 9/7 & 9/8-advised her in detail regarding patient's ongoing illness and care. Advised her regarding poor overall prognosis secondary to advanced age, advanced dementia and related poor oral intake leading to current dehydration and acute renal failure which will likely recur, dysphagia related to dementia and high risk for aspiration from by mouth intake. Requested clarification regarding continued aggressive care including tube feeding versus palliative care consultation for comfort oriented care. She stated that she would discuss with her director and get back to [place] Discussed with [name] palliative care, who says that the family has decided to go ahead with peg tube placement.  Disposition Plan: To be determined   Consultants:  None  Procedures:  IO line 9/5 > 9/5  Foley  catheter-9/5 >  Antibiotics:  None    Subjective: Patient seen and examined, nonverbal. Palliative care meeting done today   Objective: Filed Vitals:   07/17/14 0610 07/17/14 1348 07/17/14 2142 07/18/14 1500  BP: 110/60 112/65 144/91 128/73  Pulse: 75 74 76 81  Temp: 98 F (36.7 C) 98.1 F (36.7 C) 98.1 F (36.7 C) 98.5 F (36.9 C)  TempSrc: Axillary Axillary Axillary Axillary  Resp: Height:      Weight:      SpO2: 100% 100% 100% 100%    Intake/Output Summary (Last 24 hours) at 07/18/14 1614 Last data filed at 07/18/14 1500  Gross per 24 hour  Intake      0 ml  Output   1100 ml  Net  -1100 ml   Filed Weights   07/12/14 0900  Weight: 68.1 kg (150 lb 2.1 oz)     Exam:  Physical Exam: Head: Normocephalic, atraumatic.  Eyes: No signs of jaundice, EOMI Nose: Mucous membranes dry.  Lungs: Normal respiratory effort. B/L Clear to auscultation, no crackles or wheezes.  Heart: Regular RR. S1 and S2 normal  Abdomen: BS normoactive. Soft, Nondistended, non-tender.  Extremities: No pretibial edema, no erythema     Data Reviewed: Basic Metabolic Panel:  Recent Labs Lab 07/12/14 0914 07/12/14 1630 07/13/14 0409 07/13/14 1739 07/14/14 0525 07/15/14 0432 07/16/14 0355  NA 157* 161* 154* 144 142 139 135*  K 5.5* 4.7 4.0 3.8 3.5* 3.7 3.6*  CL 120* 122* 117* 110 107 104 102  CO2 GLUCOSE 123* 157* 236* 145* 103* 103* 112*  BUN 102* 102* 97* 88* 85* 77* 68*  CREATININE 3.60* 3.64* 3.38* 3.01* 2.96* 2.79* 2.52*  CALCIUM 8.8 9.0 8.7 8.4 8.3* 8.1* 8.1*  MG  --  2.9*  --   --   --   --   --   PHOS  --  5.1*  --   --   --   --  3.7   Liver Function Tests:  Recent Labs Lab 07/12/14 0914 07/16/14 0355  AST 42*  --   ALT 11  --   ALKPHOS 69  --   BILITOT <0.2*  --   PROT 7.4  --   ALBUMIN 1.8* 1.6*   No results found for this basename: LIPASE, AMYLASE,  in the last 168 hours No results found for this basename: AMMONIA,  in the last 168 hours CBC:  Recent  Labs Lab 07/12/14 0054 07/13/14 0409 07/14/14 0525 07/15/14 0432 07/16/14 0355  WBC 12.2* 10.8* 7.8 6.9 7.0  NEUTROABS 8.7*  --   --   --   --   HGB 10.9* 8.5* 8.1* 7.9* 8.0*  HCT 36.1 28.7* 26.8* 25.6* 25.4*  MCV 97.0 97.0 95.0 93.8 90.7  PLT 256 183 186 180 206   Cardiac Enzymes: No results found for this basename: CKTOTAL, CKMB, CKMBINDEX, TROPONINI,  in the last 168 hours BNP (last 3 results) No results found for this basename: PROBNP,  in the last 8760 hours CBG: No results found for this basename: GLUCAP,  in the last 168 hours  Recent Results (from the past 240 hour(s))  MRSA PCR SCREENING     Status: Abnormal   Collection Time    07/12/14 11:01 AM      Result Value Ref Range Status   MRSA by PCR POSITIVE (*) NEGATIVE Final   Comment:  The GeneXpert MRSA Assay (FDA     approved for NASAL specimens     only), is one component of a     comprehensive MRSA colonization     surveillance program. It is not     intended to diagnose MRSA     infection nor to guide or     monitor treatment for     MRSA infections.     RESULT CALLED TO, READ BACK BY AND VERIFIED WITH:     Brynda Greathouse [id number] @ 1236 BY J SCOTTON       Studies: No results found.      Scheduled Meds: . Chlorhexidine Gluconate Cloth  6 each Topical Q0600  . sennosides  10 mL Oral QHS   And  . docusate  100 mg Oral QHS  . enoxaparin (LOVENOX) injection  30 mg Subcutaneous Q24H  . fentaNYL  12.5 mcg Transdermal Q72H  . LORazepam  0.5 mg Oral BID  . mupirocin ointment   Nasal BID  . sodium chloride  3 mL Intravenous Q12H   Continuous Infusions:    Principal Problem:   Dehydration with hypernatremia Active Problems:   Sacral decubitus ulcer, stage III   Decubital ulcer   Hypertension   Unspecified hypothyroidism   Acute on chronic renal failure   Hyperkalemia   Protein-calorie malnutrition, severe    Time spent: 30 minutes.    Meredeth Ide, [facility] Pager  [phone]  If 7PM-7AM, please contact night-coverage [email or web address] Password [email or web address] 07/18/2014, 4:14 PM    LOS: 7 days

## 2014-07-18 NOTE — Plan of Care (Signed)
Problem: Problem: Skin/Wound Progression Goal: OTHER SKIN/WOUND GOAL(S) Outcome: Completed/Met Date Met:  07/18/14 Routine wound care

## 2014-07-18 NOTE — Progress Notes (Signed)
CSW spoke with patient's guardian, [name] at bedside, continue to follow for discharge planning. Patient was admitted from [facility], but plans to go to [facility] at discharge. CSW will follow-up Monday.   Clinical Social Work Department CLINICAL SOCIAL WORK PLACEMENT NOTE 07/18/2014  Patient:  Lindsay Browning, Lindsay Browning  Account Number:  [id number] Admit date:  07/11/2014  Clinical Social Worker:  Unk Lightning, LCSW  Date/time:  07/14/2014 10:45 AM  Clinical Social Work is seeking post-discharge placement for this patient at the following level of care:   SKILLED NURSING   (*CSW will update this form in Epic as items are completed)   07/14/2014  Patient/family provided with [facility] Department of Clinical Social Work's list of facilities offering this level of care within the geographic area requested by the patient (or if unable, by the patient's family).  07/14/2014  Patient/family informed of their freedom to choose among providers that offer the needed level of care, that participate in Medicare, Medicaid or managed care program needed by the patient, have an available bed and are willing to accept the patient.  07/14/2014  Patient/family informed of [facility]' ownership interest in [facility], as well as of the fact that they are under no obligation to receive care at this facility.  PASARR submitted to EDS on 07/18/2014 PASARR number received on 07/18/2014  FL2 transmitted to all facilities in geographic area requested by pt/family on  07/14/2014 FL2 transmitted to all facilities within larger geographic area on   Patient informed that his/her managed care company has contracts with or will negotiate with  certain facilities, including the following:     Patient/family informed of bed offers received:  07/18/2014 Patient chooses bed at [facility] Physician recommends and patient chooses bed at    Patient to be transferred to [facility] on   Patient to be transferred to facility by  Patient and family notified of transfer on  Name of family member notified:    The following physician request were entered in Epic:   Additional Comments:   Lincoln Maxin, LCSW [facility] Clinical Social Worker cell #: [phone]

## 2014-07-19 ENCOUNTER — Inpatient Hospital Stay (HOSPITAL_COMMUNITY): Payer: Medicare Other

## 2014-07-19 DIAGNOSIS — R1310 Dysphagia, unspecified: Secondary | ICD-10-CM

## 2014-07-19 LAB — BASIC METABOLIC PANEL
Anion gap: 12 (ref 5–15)
BUN: 47 mg/dL — ABNORMAL HIGH (ref 6–23)
CO2: 21 mEq/L (ref 19–32)
Calcium: 8.4 mg/dL (ref 8.4–10.5)
Chloride: 106 mEq/L (ref 96–112)
Creatinine, Ser: 2.17 mg/dL — ABNORMAL HIGH (ref 0.50–1.10)
GFR calc non Af Amer: 18 mL/min — ABNORMAL LOW (ref >90)
GFR, EST AFRICAN AMERICAN: 21 mL/min — AB (ref >90)
Glucose, Bld: 88 mg/dL (ref 70–99)
POTASSIUM: 3.5 meq/L — AB (ref 3.7–5.3)
Sodium: 139 mEq/L (ref 137–147)

## 2014-07-19 LAB — CBC
HCT: 22.8 % — ABNORMAL LOW (ref 36.0–46.0)
HEMOGLOBIN: 7.5 g/dL — AB (ref 12.0–15.0)
MCH: 29.6 pg (ref 26.0–34.0)
MCHC: 32.9 g/dL (ref 30.0–36.0)
MCV: 90.1 fL (ref 78.0–100.0)
PLATELETS: 241 10*3/uL (ref 150–400)
RBC: 2.53 MIL/uL — ABNORMAL LOW (ref 3.87–5.11)
RDW: 13.5 % (ref 11.5–15.5)
WBC: 5.2 10*3/uL (ref 4.0–10.5)

## 2014-07-19 LAB — PROTIME-INR
INR: 1.15 (ref 0.00–1.49)
Prothrombin Time: 14.7 seconds (ref 11.6–15.2)

## 2014-07-19 NOTE — Progress Notes (Signed)
PROGRESS NOTE    Lindsay Browning BHA:[id number] DOB: 09/18/1919 DOA: 07/11/2014 PCP: [name], MD  HPI/Brief narrative 80 year old female patient, SNF resident, has DSS guardian, likely advanced dementia & nonverbal, hypertension, chronic kidney disease, sacral and right foot decubitus ulcers, transferred from nursing home secondary to progressive decrease in functioning, fatigue, weight loss, not eating. In the ED, found to be hypernatremic with sodium 157 and acute renal failure with creatinine 3.78. Patient DO NOT RESUSCITATE. Unable to get IV access>IO>PICC line. Acute renal failure is improving. Poor candidate for by mouth intake secondary to high aspiration risk related to mental status changes and pocketing food in the mouth. Have discussed with DSS guardian and await further instructions re GOC- may decide for PEG tube despite advising them that this may not be the best option for her.   Assessment/Plan:  1. Hypernatremic dehydration: Secondary to poor oral intake related to advanced dementia. Hypernatremia had resolved. Continue gentle IV fluids. N.p.o. >patient pocketing food in the mouth and high risk for aspiration. 2. Acute on stage III chronic kidney disease: Secondary to problem #1.Oliguric. Foley placed 9/5 for close intake and output monitoring secondary to incontinence. Creatinine has slowly improved but not as quickly as expected.? ATN. Renal ultrasound without hydronephrosis. Continue gentle IV fluids. Urine output improving. 3. Mild hyperkalemia: Treat for acute renal failure as above and follow BMP closely. Kayexalate enema. Resolved. 4. Stage III sacral decubitus and right foot pressure ulcers: Management per wound care consultation. 5. Advanced dementia: Mental status may be close to baseline. Was on regular diet at SNF and here initially felt able to tolerate medications with applesauce. Speech therapy evaluated patient and recommended dysphagia 1 diet and nectar thick  liquids. Patient at high risk of aspiration secondary to advanced dementia. Patient pocketing food in the mouth-changed to n.p.o. on 9/7. Discussed with DSS guardian [name] on 9/8-she will discuss with her supervisor and get back to [place] regarding further course of action-she stated that they may consider tube feeding. Advised her that tube feeding for this elderly frail patient with advanced dementia may not be the best option. It may prolong life but will not change prior poor quality of life and it has its own complications including pain, infection, bleeding and aspiration risks. Await call back. As per guardian, progressive MS decline since June 2015. 6. Hypertension: Controlled. Currently off medications. 7. Anemia: Hemoglobin dropped from 10.9 > 8.5, likely dilutional. Stable. 8. Dysphagia: NPO, will get IR consult for peg tube placement. 9. Elevated TSH:  F T4 0.77, F T3 1.8, Will need to repeat the thyroid studiers in 6-8 weeks   Code Status: DO NOT RESUSCITATE  Family Communication: Dr Algis Liming  discussed in detail with DSS guardian Ms. Dontad Woolard on 9/7 & 9/8-advised her in detail regarding patient's ongoing illness and care. Advised her regarding poor overall prognosis secondary to advanced age, advanced dementia and related poor oral intake leading to current dehydration and acute renal failure which will likely recur, dysphagia related to dementia and high risk for aspiration from by mouth intake. Requested clarification regarding continued aggressive care including tube feeding versus palliative care consultation for comfort oriented care. She stated that she would discuss with her director and get back to [place] Discussed with [name] palliative care, who says that the family has decided to go ahead with peg tube placement.  Disposition Plan: To be determined   Consultants:  None  Procedures:  IO line 9/5 > 9/5  Foley  catheter-9/5 >  Antibiotics:  None    Subjective: Patient seen and examined, nonverbal. Palliative care met with family and DSS, Director Ms [name]. They all agree with Peg tube placement.  Objective: Filed Vitals:   07/18/14 1500 07/18/14 2137 07/19/14 0558 07/19/14 0634  BP: 128/73 97/78 128/50   Pulse: 81 77 64   Temp: 98.5 F (36.9 C) 98.4 F (36.9 C) 97.3 F (36.3 C)   TempSrc: Axillary Axillary Axillary   Resp: $Remo'20 18 18   'TmRXx$ Height:      Weight:      SpO2: 100% 97% 82% 100%    Intake/Output Summary (Last 24 hours) at 07/19/14 1122 Last data filed at 07/19/14 0808  Gross per 24 hour  Intake      0 ml  Output    710 ml  Net   -710 ml   Filed Weights   07/12/14 0900  Weight: 68.1 kg (150 lb 2.1 oz)     Exam:  Physical Exam: Head: Normocephalic, atraumatic.  Eyes: No signs of jaundice, EOMI Nose: Mucous membranes dry.  Lungs: Normal respiratory effort. B/L Clear to auscultation, no crackles or wheezes.  Heart: Regular RR. S1 and S2 normal  Abdomen: BS normoactive. Soft, Nondistended, non-tender.  Extremities: No pretibial edema, no erythema     Data Reviewed: Basic Metabolic Panel:  Recent Labs Lab 07/12/14 1630  07/13/14 1739 07/14/14 0525 07/15/14 0432 07/16/14 0355 07/19/14 0455  NA 161*  < > 144 142 139 135* 139  K 4.7  < > 3.8 3.5* 3.7 3.6* 3.5*  CL 122*  < > 110 107 104 102 106  CO2 22  < > $R'21 21 21 21 21  'Re$ GLUCOSE 157*  < > 145* 103* 103* 112* 88  BUN 102*  < > 88* 85* 77* 68* 47*  CREATININE 3.64*  < > 3.01* 2.96* 2.79* 2.52* 2.17*  CALCIUM 9.0  < > 8.4 8.3* 8.1* 8.1* 8.4  MG 2.9*  --   --   --   --   --   --   PHOS 5.1*  --   --   --   --  3.7  --   < > = values in this interval not displayed. Liver Function Tests:  Recent Labs Lab 07/16/14 0355  ALBUMIN 1.6*   No results found for this basename: LIPASE, AMYLASE,  in the last 168 hours No results found for this basename: AMMONIA,  in the last 168 hours CBC:  Recent Labs Lab 07/13/14 0409 07/14/14 0525  07/15/14 0432 07/16/14 0355 07/19/14 0455  WBC 10.8* 7.8 6.9 7.0 5.2  HGB 8.5* 8.1* 7.9* 8.0* 7.5*  HCT 28.7* 26.8* 25.6* 25.4* 22.8*  MCV 97.0 95.0 93.8 90.7 90.1  PLT 183 186 180 206 241     Recent Results (from the past 240 hour(s))  MRSA PCR SCREENING     Status: Abnormal   Collection Time    07/12/14 11:01 AM      Result Value Ref Range Status   MRSA by PCR POSITIVE (*) NEGATIVE Final   Comment:            The GeneXpert MRSA Assay (FDA     approved for NASAL specimens     only), is one component of a     comprehensive MRSA colonization     surveillance program. It is not     intended to diagnose MRSA     infection nor to guide or  monitor treatment for     MRSA infections.     RESULT CALLED TO, READ BACK BY AND VERIFIED WITH:     Randalyn Rhea [id number] @ 0479 BY J SCOTTON       Studies: Ct Abdomen Wo Contrast  07/29/14   CLINICAL DATA:  Preop for G-tube placement. Evaluate position of stomach.  EXAM: CT ABDOMEN WITHOUT CONTRAST  TECHNIQUE: Multidetector CT imaging of the abdomen was performed following the standard protocol without IV contrast.  COMPARISON:  None.  FINDINGS: The lung bases are grossly clear. The heart is normal in size. A small to moderate-sized hiatal hernia is noted.  The liver is grossly normal without contrast. No biliary dilatation. The gallbladder is unremarkable. Layering slightly denser material in the gallbladder may be sludge. The pancreas is grossly normal. The spleen is normal in size. No focal lesions. The adrenal glands should knees are unremarkable except for a large right upper pole renal cyst.  The stomach is located normally just below the xiphoid process. The transverse colon is located just inferior to the stomach. No mesenteric or retroperitoneal mass or adenopathy. There is tortuosity and calcification of the thoracic aorta. The bony structures are intact.  IMPRESSION: Unremarkable CT abdomen.   Electronically Signed   By: Kalman Jewels M.D.   On: Jul 29, 2014 01:48        Scheduled Meds: . Chlorhexidine Gluconate Cloth  6 each Topical Q0600  . sennosides  10 mL Oral QHS   And  . docusate  100 mg Oral QHS  . enoxaparin (LOVENOX) injection  30 mg Subcutaneous Q24H  . fentaNYL  12.5 mcg Transdermal Q72H  . LORazepam  0.5 mg Oral BID  . mupirocin ointment   Nasal BID  . sodium chloride  3 mL Intravenous Q12H   Continuous Infusions:    Principal Problem:   Dehydration with hypernatremia Active Problems:   Sacral decubitus ulcer, stage III   Decubital ulcer   Hypertension   Unspecified hypothyroidism   Acute on chronic renal failure   Hyperkalemia   Protein-calorie malnutrition, severe   Dysphagia, unspecified(787.20)    Time spent: 30 minutes.    Oswald Hillock, [facility] Pager [phone]  If 7PM-7AM, please contact night-coverage [email or web address] Password [email or web address] 07/29/2014, 11:22 AM    LOS: 8 days

## 2014-07-20 ENCOUNTER — Encounter (HOSPITAL_COMMUNITY): Payer: Self-pay

## 2014-07-20 MED ORDER — DEXTROSE-NACL 5-0.45 % IV SOLN
INTRAVENOUS | Status: DC
  Administered 2014-07-20 – 2014-07-22 (×4): via INTRAVENOUS

## 2014-07-20 MED ORDER — INFLUENZA VAC SPLIT QUAD 0.5 ML IM SUSY
0.5000 mL | PREFILLED_SYRINGE | INTRAMUSCULAR | Status: AC
  Administered 2014-07-21: 0.5 mL via INTRAMUSCULAR
  Filled 2014-07-20 (×2): qty 0.5

## 2014-07-20 NOTE — Progress Notes (Signed)
CT imaging approved for percutaneous gastrostomy tube placement for Tuesday 9/15 secondary to 44F G-tubes not in stock until Tuesday. IR PA to see patient and obtain consent from DSS who is only available for non-emergencies on M-F.  Pattricia Boss PA-C Interventional Radiology  07/20/14  9:17 AM

## 2014-07-20 NOTE — Progress Notes (Signed)
PROGRESS NOTE    TRUST CRAGO NGE:[id number] DOB: 1919-03-13 DOA: 07/11/2014 PCP: [name], MD  HPI/Brief narrative 80 year old female patient, SNF resident, has DSS guardian, likely advanced dementia & nonverbal, hypertension, chronic kidney disease, sacral and right foot decubitus ulcers, transferred from nursing home secondary to progressive decrease in functioning, fatigue, weight loss, not eating. In the ED, found to be hypernatremic with sodium 157 and acute renal failure with creatinine 3.78. Patient DO NOT RESUSCITATE. Unable to get IV access>IO>PICC line. Acute renal failure is improving. Poor candidate for by mouth intake secondary to high aspiration risk related to mental status changes and pocketing food in the mouth. Have discussed with DSS guardian and await further instructions re GOC- may decide for PEG tube despite advising them that this may not be the best option for her.   Assessment/Plan:  1. Hypernatremic dehydration: Secondary to poor oral intake related to advanced dementia. Hypernatremia had resolved. Continue gentle IV fluids. N.p.o. >patient pocketing food in the mouth and high risk for aspiration. 2. Acute on stage III chronic kidney disease: Secondary to problem #1.Oliguric. Foley placed 9/5 for close intake and output monitoring secondary to incontinence. Creatinine has slowly improved but not as quickly as expected.? ATN. Renal ultrasound without hydronephrosis. Continue gentle IV fluids. Urine output improving. 3. Mild hyperkalemia: Treat for acute renal failure as above and follow BMP closely. Kayexalate enema. Resolved. 4. Stage III sacral decubitus and right foot pressure ulcers: Management per wound care consultation. 5. Advanced dementia: Mental status may be close to baseline. Was on regular diet at SNF and here initially felt able to tolerate medications with applesauce. Speech therapy evaluated patient and recommended dysphagia 1 diet and nectar thick  liquids. Patient at high risk of aspiration secondary to advanced dementia. Patient pocketing food in the mouth-changed to n.p.o. on 9/7. Discussed with DSS guardian [name] on 9/8-she will discuss with her supervisor and get back to [place] regarding further course of action-she stated that they may consider tube feeding. Advised her that tube feeding for this elderly frail patient with advanced dementia may not be the best option. It may prolong life but will not change prior poor quality of life and it has its own complications including pain, infection, bleeding and aspiration risks. Await call back. As per guardian, progressive MS decline since June 2015. 6. Hypertension: Controlled. Currently off medications. 7. Anemia: Hemoglobin dropped from 10.9 > 7.5, will check stool for occult blood. 8. Dysphagia: NPO, will get IR consult for peg tube placement. Plan is for peg tube placement on Tuesday. 9. Elevated TSH:  F T4 0.77, F T3 1.8, Will need to repeat the thyroid studiers in 6-8 weeks   Code Status: DO NOT RESUSCITATE  Family Communication: Dr Algis Liming  discussed in detail with DSS guardian Ms. Dontad Woolard on 9/7 & 9/8-advised her in detail regarding patient's ongoing illness and care. Advised her regarding poor overall prognosis secondary to advanced age, advanced dementia and related poor oral intake leading to current dehydration and acute renal failure which will likely recur, dysphagia related to dementia and high risk for aspiration from by mouth intake. Requested clarification regarding continued aggressive care including tube feeding versus palliative care consultation for comfort oriented care. She stated that she would discuss with her director and get back to [place] Discussed with [name] palliative care, who says that the family has decided to go ahead with peg tube placement.  Disposition Plan: To be determined   Consultants:  None  Procedures:  IO line 9/5 > 9/5  Foley  catheter-9/5 >  Antibiotics:  None   Subjective: Patient seen and examined, nonverbal. Palliative care met with family and DSS, Director Ms [name]. They all agree with Peg tube placement.  Objective: Filed Vitals:   07-Aug-2014 0634 2014/08/07 1329 2014/08/07 2144 07/20/14 0550  BP:  120/64 124/81 116/47  Pulse:  68 64 79  Temp:  97.9 F (36.6 C) 97.5 F (36.4 C) 98.1 F (36.7 C)  TempSrc:  Axillary Axillary Axillary  Resp:  _0 Height:      Weight:      SpO2: 100% 99% 91% 100%    Intake/Output Summary (Last 24 hours) at 07/20/14 1253 Last data filed at 07/20/14 0901  Gross per 24 hour  Intake      0 ml  Output    726 ml  Net   -726 ml   Filed Weights   07/12/14 0900  Weight: 68.1 kg (150 lb 2.1 oz)     Exam:  Physical Exam: Head: Normocephalic, atraumatic.  Eyes: No signs of jaundice, EOMI Nose: Mucous membranes dry.  Lungs: Normal respiratory effort. B/L Clear to auscultation, no crackles or wheezes.  Heart: Regular RR. S1 and S2 normal  Abdomen: BS normoactive. Soft, Nondistended, non-tender.  Extremities: No pretibial edema, no erythema     Data Reviewed: Basic Metabolic Panel:  Recent Labs Lab 07/13/14 1739 07/14/14 0525 07/15/14 0432 07/16/14 0355 08/07/2014 0455  NA 144 142 139 135* 139  K 3.8 3.5* 3.7 3.6* 3.5*  CL 110 107 104 102 106  CO2 _1 GLUCOSE 145* 103* 103* 112* 88  BUN 88* 85* 77* 68* 47*  CREATININE 3.01* 2.96* 2.79* 2.52* 2.17*  CALCIUM 8.4 8.3* 8.1* 8.1* 8.4  PHOS  --   --   --  3.7  --    Liver Function Tests:  Recent Labs Lab 07/16/14 0355  ALBUMIN 1.6*   No results found for this basename: LIPASE, AMYLASE,  in the last 168 hours No results found for this basename: AMMONIA,  in the last 168 hours CBC:  Recent Labs Lab 07/14/14 0525 07/15/14 0432 07/16/14 0355 08/07/14 0455  WBC 7.8 6.9 7.0 5.2  HGB 8.1* 7.9* 8.0* 7.5*  HCT 26.8* 25.6* 25.4* 22.8*  MCV 95.0 93.8 90.7 90.1  PLT 186 180 206 241      Recent Results (from the past 240 hour(s))  MRSA PCR SCREENING     Status: Abnormal   Collection Time    07/12/14 11:01 AM      Result Value Ref Range Status   MRSA by PCR POSITIVE (*) NEGATIVE Final   Comment:            The GeneXpert MRSA Assay (FDA     approved for NASAL specimens     only), is one component of a     comprehensive MRSA colonization     surveillance program. It is not     intended to diagnose MRSA     infection nor to guide or     monitor treatment for     MRSA infections.     RESULT CALLED TO, READ BACK BY AND VERIFIED WITH:     Randalyn Rhea [id number] @ 6440 BY J SCOTTON       Studies: Ct Abdomen Wo Contrast  07-Aug-2014   CLINICAL DATA:  Preop for G-tube placement. Evaluate position of stomach.  EXAM: CT ABDOMEN  WITHOUT CONTRAST  TECHNIQUE: Multidetector CT imaging of the abdomen was performed following the standard protocol without IV contrast.  COMPARISON:  None.  FINDINGS: The lung bases are grossly clear. The heart is normal in size. A small to moderate-sized hiatal hernia is noted.  The liver is grossly normal without contrast. No biliary dilatation. The gallbladder is unremarkable. Layering slightly denser material in the gallbladder may be sludge. The pancreas is grossly normal. The spleen is normal in size. No focal lesions. The adrenal glands should knees are unremarkable except for a large right upper pole renal cyst.  The stomach is located normally just below the xiphoid process. The transverse colon is located just inferior to the stomach. No mesenteric or retroperitoneal mass or adenopathy. There is tortuosity and calcification of the thoracic aorta. The bony structures are intact.  IMPRESSION: Unremarkable CT abdomen.   Electronically Signed   By: Kalman Jewels M.D.   On: 07/19/2014 01:48        Scheduled Meds: . Chlorhexidine Gluconate Cloth  6 each Topical Q0600  . sennosides  10 mL Oral QHS   And  . docusate  100 mg Oral QHS  .  enoxaparin (LOVENOX) injection  30 mg Subcutaneous Q24H  . fentaNYL  12.5 mcg Transdermal Q72H  . [START ON 07/21/2014] Influenza vac split quadrivalent PF  0.5 mL Intramuscular Tomorrow-1000  . LORazepam  0.5 mg Oral BID  . mupirocin ointment   Nasal BID  . sodium chloride  3 mL Intravenous Q12H   Continuous Infusions: . dextrose 5 % and 0.45% NaCl 1,000 mL infusion 50 mL/hr at 07/20/14 0848    Principal Problem:   Dehydration with hypernatremia Active Problems:   Sacral decubitus ulcer, stage III   Decubital ulcer   Hypertension   Unspecified hypothyroidism   Acute on chronic renal failure   Hyperkalemia   Protein-calorie malnutrition, severe   Dysphagia, unspecified(787.20)    Time spent: 30 minutes.    Oswald Hillock, [facility] Pager [phone]  If 7PM-7AM, please contact night-coverage [email or web address] Password [email or web address] 07/20/2014, 12:53 PM    LOS: 9 days

## 2014-07-21 DIAGNOSIS — E870 Hyperosmolality and hypernatremia: Secondary | ICD-10-CM | POA: Diagnosis not present

## 2014-07-21 LAB — CBC
HCT: 23.2 % — ABNORMAL LOW (ref 36.0–46.0)
HEMOGLOBIN: 7.5 g/dL — AB (ref 12.0–15.0)
MCH: 28.8 pg (ref 26.0–34.0)
MCHC: 32.3 g/dL (ref 30.0–36.0)
MCV: 89.2 fL (ref 78.0–100.0)
PLATELETS: 285 10*3/uL (ref 150–400)
RBC: 2.6 MIL/uL — AB (ref 3.87–5.11)
RDW: 13.5 % (ref 11.5–15.5)
WBC: 5.1 10*3/uL (ref 4.0–10.5)

## 2014-07-21 LAB — COMPREHENSIVE METABOLIC PANEL
ALT: 9 U/L (ref 0–35)
AST: 36 U/L (ref 0–37)
Albumin: 1.7 g/dL — ABNORMAL LOW (ref 3.5–5.2)
Alkaline Phosphatase: 75 U/L (ref 39–117)
Anion gap: 12 (ref 5–15)
BILIRUBIN TOTAL: 0.2 mg/dL — AB (ref 0.3–1.2)
BUN: 33 mg/dL — ABNORMAL HIGH (ref 6–23)
CALCIUM: 8.6 mg/dL (ref 8.4–10.5)
CO2: 21 mEq/L (ref 19–32)
Chloride: 105 mEq/L (ref 96–112)
Creatinine, Ser: 1.84 mg/dL — ABNORMAL HIGH (ref 0.50–1.10)
GFR calc Af Amer: 26 mL/min — ABNORMAL LOW (ref >90)
GFR, EST NON AFRICAN AMERICAN: 23 mL/min — AB (ref >90)
GLUCOSE: 109 mg/dL — AB (ref 70–99)
Potassium: 3.5 mEq/L — ABNORMAL LOW (ref 3.7–5.3)
SODIUM: 138 meq/L (ref 137–147)
Total Protein: 6.7 g/dL (ref 6.0–8.3)

## 2014-07-21 MED ORDER — VANCOMYCIN HCL IN DEXTROSE 1-5 GM/200ML-% IV SOLN
1000.0000 mg | INTRAVENOUS | Status: AC
  Filled 2014-07-21: qty 200

## 2014-07-21 NOTE — Progress Notes (Signed)
PROGRESS NOTE    TRUST CRAGO NGE:[id number] DOB: 1919-03-13 DOA: 07/11/2014 PCP: Lindsay Poll, MD  HPI/Brief narrative 80 year old female patient, SNF resident, has DSS guardian, likely advanced dementia & nonverbal, hypertension, chronic kidney disease, sacral and right foot decubitus ulcers, transferred from nursing home secondary to progressive decrease in functioning, fatigue, weight loss, not eating. In the ED, found to be hypernatremic with sodium 157 and acute renal failure with creatinine 3.78. Patient DO NOT RESUSCITATE. Unable to get IV access>IO>PICC line. Acute renal failure is improving. Poor candidate for by mouth intake secondary to high aspiration risk related to mental status changes and pocketing food in the mouth. Have discussed with DSS guardian and await further instructions re GOC- may decide for PEG tube despite advising them that this may not be the best option for her.   Assessment/Plan:  1. Hypernatremic dehydration: Secondary to poor oral intake related to advanced dementia. Hypernatremia had resolved. Continue gentle IV fluids. N.p.o. >patient pocketing food in the mouth and high risk for aspiration. 2. Acute on stage III chronic kidney disease: Secondary to problem #1.Oliguric. Foley placed 9/5 for close intake and output monitoring secondary to incontinence. Creatinine has slowly improved but not as quickly as expected.? ATN. Renal ultrasound without hydronephrosis. Continue gentle IV fluids. Urine output improving. 3. Mild hyperkalemia: Treat for acute renal failure as above and follow BMP closely. Kayexalate enema. Resolved. 4. Stage III sacral decubitus and right foot pressure ulcers: Management per wound care consultation. 5. Advanced dementia: Mental status may be close to baseline. Was on regular diet at SNF and here initially felt able to tolerate medications with applesauce. Speech therapy evaluated patient and recommended dysphagia 1 diet and nectar thick  liquids. Patient at high risk of aspiration secondary to advanced dementia. Patient pocketing food in the mouth-changed to n.p.o. on 9/7. Discussed with DSS guardian Lindsay. Browning on 9/8-she will discuss with her supervisor and get back to [place] regarding further course of action-she stated that they may consider tube feeding. Advised her that tube feeding for this elderly frail patient with advanced dementia may not be the best option. It may prolong life but will not change prior poor quality of life and it has its own complications including pain, infection, bleeding and aspiration risks. Await call back. As per guardian, progressive Lindsay decline since June 2015. 6. Hypertension: Controlled. Currently off medications. 7. Anemia: Hemoglobin dropped from 10.9 > 7.5, will check stool for occult blood. 8. Dysphagia: NPO, will get IR consult for peg tube placement. Plan is for peg tube placement on Tuesday. 9. Elevated TSH:  F T4 0.77, F T3 1.8, Will need to repeat the thyroid studiers in 6-8 weeks   Code Status: DO NOT RESUSCITATE  Family Communication: Dr Algis Liming  discussed in detail with DSS guardian Lindsay. Dontad Browning on 9/7 & 9/8-advised her in detail regarding patient's ongoing illness and care. Advised her regarding poor overall prognosis secondary to advanced age, advanced dementia and related poor oral intake leading to current dehydration and acute renal failure which will likely recur, dysphagia related to dementia and high risk for aspiration from by mouth intake. Requested clarification regarding continued aggressive care including tube feeding versus palliative care consultation for comfort oriented care. She stated that she would discuss with her director and get back to [place] Discussed with Lindsay Browning palliative care, who says that the family has decided to go ahead with peg tube placement.  Disposition Plan: To be determined   Consultants:  None  Procedures:  IO line 9/5 > 9/5  Foley  catheter-9/5 >  Antibiotics:  None   Subjective: Patient seen and examined, nonverbal. Palliative care met with family and DSS, Director Lindsay [name]. They all agree with Peg tube placement.  Objective: Filed Vitals:   07/20/14 1400 07/20/14 2127 07/21/14 0612 07/21/14 1425  BP: 118/67 98/64 113/72 116/80  Pulse: 62 89 78 90  Temp: 97.8 F (36.6 C) 99 F (37.2 C) 98.5 F (36.9 C) 98.6 F (37 C)  TempSrc: Axillary Oral Oral Axillary  Resp: _0 Height:      Weight:      SpO2: 98% 100% 100% 100%    Intake/Output Summary (Last 24 hours) at 07/21/14 1543 Last data filed at 07/21/14 1500  Gross per 24 hour  Intake 1175.83 ml  Output    575 ml  Net 600.83 ml   Filed Weights   07/12/14 0900  Weight: 68.1 kg (150 lb 2.1 oz)     Exam:  Physical Exam: Head: Normocephalic, atraumatic.  Eyes: No signs of jaundice, EOMI Nose: Mucous membranes dry.  Lungs: Normal respiratory effort. B/L Clear to auscultation, no crackles or wheezes.  Heart: Regular RR. S1 and S2 normal  Abdomen: BS normoactive. Soft, Nondistended, non-tender.  Extremities: No pretibial edema, no erythema     Data Reviewed: Basic Metabolic Panel:  Recent Labs Lab 07/15/14 0432 07/16/14 0355 07/19/14 0455 07/21/14 0317  NA 139 135* 139 138  K 3.7 3.6* 3.5* 3.5*  CL 104 102 106 105  CO2 _1 GLUCOSE 103* 112* 88 109*  BUN 77* 68* 47* 33*  CREATININE 2.79* 2.52* 2.17* 1.84*  CALCIUM 8.1* 8.1* 8.4 8.6  PHOS  --  3.7  --   --    Liver Function Tests:  Recent Labs Lab 07/16/14 0355 07/21/14 0317  AST  --  36  ALT  --  9  ALKPHOS  --  75  BILITOT  --  0.2*  PROT  --  6.7  ALBUMIN 1.6* 1.7*   No results found for this basename: LIPASE, AMYLASE,  in the last 168 hours No results found for this basename: AMMONIA,  in the last 168 hours CBC:  Recent Labs Lab 07/15/14 0432 07/16/14 0355 07/19/14 0455 07/21/14 0317  WBC 6.9 7.0 5.2 5.1  HGB 7.9* 8.0* 7.5* 7.5*  HCT  25.6* 25.4* 22.8* 23.2*  MCV 93.8 90.7 90.1 89.2  PLT 180 206 241 285     Recent Results (from the past 240 hour(s))  MRSA PCR SCREENING     Status: Abnormal   Collection Time    07/12/14 11:01 AM      Result Value Ref Range Status   MRSA by PCR POSITIVE (*) NEGATIVE Final   Comment:            The GeneXpert MRSA Assay (FDA     approved for NASAL specimens     only), is one component of a     comprehensive MRSA colonization     surveillance program. It is not     intended to diagnose MRSA     infection nor to guide or     monitor treatment for     MRSA infections.     RESULT CALLED TO, READ BACK BY AND VERIFIED WITH:     Randalyn Rhea [id number] @ [id number] BY J SCOTTON       Studies: No results found.  Scheduled Meds: . Chlorhexidine Gluconate Cloth  6 each Topical Q0600  . sennosides  10 mL Oral QHS   And  . docusate  100 mg Oral QHS  . enoxaparin (LOVENOX) injection  30 mg Subcutaneous Q24H  . fentaNYL  12.5 mcg Transdermal Q72H  . LORazepam  0.5 mg Oral BID  . mupirocin ointment   Nasal BID  . sodium chloride  3 mL Intravenous Q12H  . [START ON 07/22/2014] vancomycin  1,000 mg Intravenous On Call   Continuous Infusions: . dextrose 5 % and 0.45% NaCl 1,000 mL infusion 50 mL/hr at 07/21/14 0354    Principal Problem:   Dehydration with hypernatremia Active Problems:   Sacral decubitus ulcer, stage III   Decubital ulcer   Hypertension   Unspecified hypothyroidism   Acute on chronic renal failure   Hyperkalemia   Protein-calorie malnutrition, severe   Dysphagia, unspecified(787.20)    Time spent: 30 minutes.    Oswald Hillock, [facility] Pager [phone]  If 7PM-7AM, please contact night-coverage [email or web address] Password [email or web address] 07/21/2014, 3:43 PM    LOS: 10 days

## 2014-07-21 NOTE — Consult Note (Signed)
Reason for consult: gastrostomy tube placement  Referring Physician(s): Dr. Drusilla Kanner  History of Present Illness: Lindsay Browning is a 80 y.o.  frail elderly  female with history of advanced dementia/nonverbal, dysphagia, HTN, CKD, sacral and right foot decubitus ulcers admitted with FTT, hypernatremia and ARF. Due to MS changes pt has not been eating appropriately (pocketing food in mouth) and is high aspiration risk. Recent discussions held by primary service with DSS guardian and family who are now requesting tube feeds despite fact that it will not alter quality of life. Recent imaging has been reviewed by Dr. Deanne Coffer and anatomically pt is candidate for gastrostomy tube placement.  Past Medical History  Diagnosis Date  . Medical history unknown   . Hypertension   . Renal disorder     Past Surgical History  Procedure Laterality Date  . Surgical history unknown    . Cardiac surgery      cath  . Foot surgery      Allergies: Penicillins  Medications: Prior to Admission medications   Medication Sig Start Date End Date Taking? Authorizing Provider  ascorbic acid (VITAMIN C) 500 MG tablet Take 1 tablet (500 mg total) by mouth 2 (two) times daily. 11/29/13  Yes Shanker Levora Dredge, MD  ergocalciferol (VITAMIN D2) 50000 UNITS capsule Take 50,000 Units by mouth every 30 (thirty) days.   Yes Historical Provider, MD  fentaNYL (DURAGESIC - DOSED MCG/HR) 12 MCG/HR Place 12.5 mcg onto the skin every 3 (three) days.   Yes Historical Provider, MD  FLUoxetine (PROZAC) 10 MG capsule Take 10 mg by mouth daily.   Yes Historical Provider, MD  LORazepam (ATIVAN) 0.5 MG tablet Take 0.5 mg by mouth 2 (two) times daily.   Yes Historical Provider, MD  Multiple Vitamins-Minerals (CERTA-VITE PO) Take 1 tablet by mouth daily.    Yes Historical Provider, MD  nebivolol (BYSTOLIC) 10 MG tablet Take 10 mg by mouth daily.   Yes Historical Provider, MD  QUEtiapine (SEROQUEL) 25 MG tablet Take 25 mg by mouth  2 (two) times daily.    Yes Historical Provider, MD  senna-docusate (SENOKOT-S) 8.6-50 MG per tablet Take 2 tablets by mouth at bedtime. 11/29/13  Yes Shanker Levora Dredge, MD  lidocaine (LIDODERM) 5 % Place 1 patch onto the skin daily. Apply to left knee, Remove & Discard patch within 12 hours or as directed by MD    Historical Provider, MD  NONFORMULARY OR COMPOUNDED ITEM Apply 1 mL topically 2 (two) times daily as needed (for anxiety or agitation). Lorazepam /1ML    Historical Provider, MD  oxyCODONE-acetaminophen (PERCOCET/ROXICET) 5-325 MG per tablet Take 1-2 tablets by mouth every 6 (six) hours as needed for severe pain.    Historical Provider, MD    History reviewed. No pertinent family history.  History   Social History  . Marital Status: Widowed    Spouse Name: N/A    Number of Children: N/A  . Years of Education: N/A   Social History Main Topics  . Smoking status: Unknown If Ever Smoked  . Smokeless tobacco: None     Comment: patient is nonverbal, no family present  . Alcohol Use: No  . Drug Use: No  . Sexual Activity: None   Other Topics Concern  . None   Social History Narrative  . None         Review of Systems  see H& P  Vital Signs: BP 113/72  Pulse 78  Temp(Src) 98.5 F (36.9  C) (Oral)  Resp 16  Ht  (1.651 m)  Wt 150 lb 2.1 oz (68.1 kg)  BMI 24.98 kg/m2  SpO2 100%  Physical Exam pt nonverbal; chest- CTA bilat ant; heart- RRR; abd- soft,+BS,NT; ext- no pretib edema; feet in boots  Imaging: Ct Abdomen Wo Contrast  07/19/2014   CLINICAL DATA:  Preop for G-tube placement. Evaluate position of stomach.  EXAM: CT ABDOMEN WITHOUT CONTRAST  TECHNIQUE: Multidetector CT imaging of the abdomen was performed following the standard protocol without IV contrast.  COMPARISON:  None.  FINDINGS: The lung bases are grossly clear. The heart is normal in size. A small to moderate-sized hiatal hernia is noted.  The liver is grossly normal without contrast. No  biliary dilatation. The gallbladder is unremarkable. Layering slightly denser material in the gallbladder may be sludge. The pancreas is grossly normal. The spleen is normal in size. No focal lesions. The adrenal glands should knees are unremarkable except for a large right upper pole renal cyst.  The stomach is located normally just below the xiphoid process. The transverse colon is located just inferior to the stomach. No mesenteric or retroperitoneal mass or adenopathy. There is tortuosity and calcification of the thoracic aorta. The bony structures are intact.  IMPRESSION: Unremarkable CT abdomen.   Electronically Signed   By: Loralie Champagne M.D.   On: 07/19/2014 01:48   US Renal  07/14/2014   CLINICAL DATA:  Acute renal failure.  Evaluate for hydronephrosis.  EXAM: RENAL/URINARY TRACT ULTRASOUND COMPLETE  COMPARISON:  None.  FINDINGS: Right Kidney:  Length: 8.4 cm. Parenchymal echogenicity is increased. There is a 7.3 x 5.1 x 7.5 cm anechoic mass with increased through transmission off the upper pole. No solid lesion. No hydronephrosis.  Left Kidney:  Length: 7.4 cm. Parenchymal echogenicity is increased. No hydronephrosis. No focal lesion.  Bladder:  Foley catheter is seen in a decompressed bladder.  IMPRESSION: 1. Small echogenic kidneys, indicative of chronic medical renal disease. 2. Large right renal cyst.   Electronically Signed   By: Leanna Battles M.D.   On: 07/14/2014 12:31   Dg Chest Port 1 View  07/12/2014   CLINICAL DATA:  Dehydration, dementia  EXAM: PORTABLE CHEST - 1 VIEW  COMPARISON:  11/21/2013  FINDINGS: Lungs are essentially clear. No focal consolidation. No pleural effusion or pneumothorax.  The heart is top-normal in size.  IMPRESSION: No evidence of acute cardiopulmonary disease.   Electronically Signed   By: Charline Bills M.D.   On: 07/12/2014 09:06    Labs: Lab Results  Component Value Date   WBC 5.1 07/21/2014   HCT 23.2* 07/21/2014   MCV 89.2 07/21/2014   PLT 285  07/21/2014   NA 138 07/21/2014   K 3.5* 07/21/2014   CL 105 07/21/2014   CO2 21 07/21/2014   GLUCOSE 109* 07/21/2014   BUN 33* 07/21/2014   CREATININE 1.84* 07/21/2014   CALCIUM 8.6 07/21/2014   PROT 6.7 07/21/2014   ALBUMIN 1.7* 07/21/2014   AST 36 07/21/2014   ALT 9 07/21/2014   ALKPHOS 75 07/21/2014   BILITOT 0.2* 07/21/2014   GFRNONAA 23* 07/21/2014   GFRAA 26* 07/21/2014   INR 1.15 07/19/2014    Assessment and Plan: Lindsay Browning is a 80 y.o.  frail elderly  female with history of advanced dementia/nonverbal, dysphagia, HTN, CKD, sacral and right foot decubitus ulcers admitted with FTT, hypernatremia and ARF. Due to MS changes pt has not been eating appropriately (pocketing food in mouth) and  is high aspiration risk. Recent discussions held by primary service with DSS guardian and family who are now requesting tube feeds despite fact that it will not alter quality of life. Recent imaging has been reviewed by Dr. Deanne Coffer and anatomically pt is candidate for gastrostomy tube placement. Details/risks of procedure d/w pt's POA/ guardian  , Lynnell Dike, with her understanding and consent.  Jeananne Rama, PAC   I spent a total of 30 minutes face to face in clinical consultation, greater than 50% of which was counseling/coordinating care for Ms. Claudette Laws.

## 2014-07-22 LAB — BASIC METABOLIC PANEL
ANION GAP: 11 (ref 5–15)
BUN: 26 mg/dL — ABNORMAL HIGH (ref 6–23)
CALCIUM: 8.4 mg/dL (ref 8.4–10.5)
CO2: 21 meq/L (ref 19–32)
CREATININE: 1.72 mg/dL — AB (ref 0.50–1.10)
Chloride: 105 mEq/L (ref 96–112)
GFR calc Af Amer: 28 mL/min — ABNORMAL LOW (ref >90)
GFR, EST NON AFRICAN AMERICAN: 24 mL/min — AB (ref >90)
Glucose, Bld: 92 mg/dL (ref 70–99)
Potassium: 3.4 mEq/L — ABNORMAL LOW (ref 3.7–5.3)
Sodium: 137 mEq/L (ref 137–147)

## 2014-07-22 LAB — CBC
HCT: 22.1 % — ABNORMAL LOW (ref 36.0–46.0)
Hemoglobin: 7.2 g/dL — ABNORMAL LOW (ref 12.0–15.0)
MCH: 29.1 pg (ref 26.0–34.0)
MCHC: 32.6 g/dL (ref 30.0–36.0)
MCV: 89.5 fL (ref 78.0–100.0)
PLATELETS: 275 10*3/uL (ref 150–400)
RBC: 2.47 MIL/uL — AB (ref 3.87–5.11)
RDW: 13.7 % (ref 11.5–15.5)
WBC: 4.8 10*3/uL (ref 4.0–10.5)

## 2014-07-22 LAB — ABO/RH: ABO/RH(D): A POS

## 2014-07-22 LAB — PREPARE RBC (CROSSMATCH)

## 2014-07-22 MED ORDER — SODIUM CHLORIDE 0.9 % IV SOLN
Freq: Once | INTRAVENOUS | Status: DC
Start: 2014-07-22 — End: 2014-07-24

## 2014-07-22 MED ORDER — SODIUM CHLORIDE 0.9 % IV BOLUS (SEPSIS)
1000.0000 mL | Freq: Once | INTRAVENOUS | Status: AC
  Administered 2014-07-22: 1000 mL via INTRAVENOUS

## 2014-07-22 NOTE — Progress Notes (Signed)
Pt BP 76/42 HR 118. MD notified. Will continue to monitor.

## 2014-07-22 NOTE — Progress Notes (Signed)
PROGRESS NOTE    Lindsay Browning VVO:[id number] DOB: Nov 16, 1918 DOA: 07/11/2014 PCP: Lindsay Poll, MD  HPI/Brief narrative 80 year old female patient, SNF resident, has DSS guardian, l advanced dementia & nonverbal, hypertension, chronic kidney disease, sacral and right foot decubitus ulcers, transferred from nursing home secondary to progressive decrease in functioning, fatigue, weight loss, not eating. In the ED, found to be hypernatremic with sodium 157 and acute renal failure with creatinine 3.78. Patient DO NOT RESUSCITATE. Unable to get IV access>IO>PICC line. Acute renal failure is improving. Poor candidate for by mouth intake secondary to high aspiration risk related to mental status changes and pocketing food in the mouth. Palliative care consultation was obtained, and goals of care meeting was done with patient's daughter and DSS to Director. Ms. Lindsay Browning, but decided to go ahead with PEG tube trial to see if patient improves with nutrition. Plan is for PEG tube placement on Tuesday.   Assessment/Plan:  1. Hypernatremic dehydration:  Resolved, Secondary to poor oral intake related to advanced dementia. Hypernatremia had resolved. Continue gentle IV fluids. N.p.o. >patient pocketing food in the mouth and high risk for aspiration.  2. Acute on stage III chronic kidney disease: Improving, Cr is now down to 1.72 Secondary to problem #1.Oliguric. Foley placed 9/5 for close intake and output monitoring secondary to incontinence. Creatinine has slowly improved . Renal ultrasound without hydronephrosis. Continue gentle IV fluids. Urine output improving.   3. Stage III sacral decubitus and right foot pressure ulcers: Management per wound care consultation.  4. Advanced dementia: Mental status may be close to baseline. Was on regular diet at SNF and here initially felt able to tolerate medications with applesauce. Speech therapy evaluated patient and recommended dysphagia 1 diet and nectar thick  liquids. Patient at high risk of aspiration secondary to advanced dementia. Patient pocketing food in the mouth-changed to n.p.o. on 9/7. Discussed with DSS guardian Ms. Lindsay Browning on 9/8-she will discuss with her supervisor and get back to [place] regarding further course of action-she stated that they may consider tube feeding. Advised her that tube feeding for this elderly frail patient with advanced dementia may not be the best option. It may prolong life but will not change prior poor quality of life and it has its own complications including pain, infection, bleeding and aspiration risks. Await call back. As per guardian, progressive MS decline since June 2015.  5. Hypertension: Controlled. Currently off medications.  6. Anemia: Hemoglobin dropped from 10.9 > 7.5, will check stool for occult blood.Stool for occult blood is still pending, as patient did not have BM. Today the hb dropped to 7.2, will transfuse two units prbc.  7. Hypotension- patient became hypotensive today with systolic blood pressure dropped to 76, she is nonverbal though alert. Will give 1 L of normal saline bolus.  8. Dysphagia: NPO, will get IR consult for peg tube placement. Plan is for peg tube placement on Tuesday.  9. Elevated TSH:  F T4 0.77, F T3 1.8, Will need to repeat the thyroid studiers in 6-8 weeks   Code Status: DO NOT RESUSCITATE  Family Communication: Dr Algis Liming  discussed in detail with DSS guardian Ms. Lindsay Browning on 9/7 & 9/8-advised her in detail regarding patient's ongoing illness and care. Advised her regarding poor overall prognosis secondary to advanced age, advanced dementia and related poor oral intake leading to current dehydration and acute renal failure which will likely recur, dysphagia related to dementia and high risk for aspiration from by mouth intake. Requested clarification  regarding continued aggressive care including tube feeding versus palliative care consultation for comfort oriented care.  She stated that she would discuss with her director and get back to [place] Discussed with [name] palliative care, who says that the family has decided to go ahead with peg tube placement.  Disposition Plan: SNF   Consultants:  None  Procedures:  IO line 9/5 > 9/5  Foley catheter-9/5 >  Antibiotics:  None   Subjective: Patient seen and examined, nonverbal. Palliative care met with family and DSS, Director Ms Lindsay Browning. They all agree with Peg tube placement.  Objective: Filed Vitals:   07/21/14 1425 07/21/14 2053 07/22/14 0547 07/22/14 1355  BP: 116/80 113/47 127/59 76/42  Pulse: 90 85 93 118  Temp: 98.6 F (37 C) 98.8 F (37.1 C) 98.6 F (37 C) 97.1 F (36.2 C)  TempSrc: Axillary Axillary Axillary Oral  Resp: $Remo'18 20 20 18  'zICjh$ Height:      Weight:      SpO2: 100% 100% 97% 100%    Intake/Output Summary (Last 24 hours) at 07/22/14 1430 Last data filed at 07/22/14 1359  Gross per 24 hour  Intake   1200 ml  Output    500 ml  Net    700 ml   Filed Weights   07/12/14 0900  Weight: 68.1 kg (150 lb 2.1 oz)     Exam:  Physical Exam: Head: Normocephalic, atraumatic.  Eyes: No signs of jaundice, EOMI Nose: Mucous membranes dry.  Lungs: Normal respiratory effort. B/L Clear to auscultation, no crackles or wheezes.  Heart: Regular RR. S1 and S2 normal  Abdomen: BS normoactive. Soft, Nondistended, non-tender.  Extremities: No pretibial edema, no erythema     Data Reviewed: Basic Metabolic Panel:  Recent Labs Lab 07/16/14 0355 07/19/14 0455 07/21/14 0317 07/22/14 0345  NA 135* 139 138 137  K 3.6* 3.5* 3.5* 3.4*  CL 102 106 105 105  CO2 $Re'21 21 21 21  'GJU$ GLUCOSE 112* 88 109* 92  BUN 68* 47* 33* 26*  CREATININE 2.52* 2.17* 1.84* 1.72*  CALCIUM 8.1* 8.4 8.6 8.4  PHOS 3.7  --   --   --    Liver Function Tests:  Recent Labs Lab 07/16/14 0355 07/21/14 0317  AST  --  36  ALT  --  9  ALKPHOS  --  75  BILITOT  --  0.2*  PROT  --  6.7  ALBUMIN 1.6* 1.7*    No results found for this basename: LIPASE, AMYLASE,  in the last 168 hours No results found for this basename: AMMONIA,  in the last 168 hours CBC:  Recent Labs Lab 07/16/14 0355 07/19/14 0455 07/21/14 0317 07/22/14 0345  WBC 7.0 5.2 5.1 4.8  HGB 8.0* 7.5* 7.5* 7.2*  HCT 25.4* 22.8* 23.2* 22.1*  MCV 90.7 90.1 89.2 89.5  PLT 206 241 285 275     No results found for this or any previous visit (from the past 240 hour(s)).     Studies: No results found.      Scheduled Meds: . sodium chloride   Intravenous Once  . Chlorhexidine Gluconate Cloth  6 each Topical Q0600  . sennosides  10 mL Oral QHS   And  . docusate  100 mg Oral QHS  . fentaNYL  12.5 mcg Transdermal Q72H  . LORazepam  0.5 mg Oral BID  . mupirocin ointment   Nasal BID  . sodium chloride  3 mL Intravenous Q12H  . vancomycin  1,000 mg Intravenous  On Call   Continuous Infusions: . dextrose 5 % and 0.45% NaCl 1,000 mL infusion 50 mL/hr at 07/21/14 2147    Principal Problem:   Dehydration with hypernatremia Active Problems:   Sacral decubitus ulcer, stage III   Decubital ulcer   Hypertension   Unspecified hypothyroidism   Acute on chronic renal failure   Hyperkalemia   Protein-calorie malnutrition, severe   Dysphagia, unspecified(787.20)    Time spent: 30 minutes.    Oswald Hillock, [facility] Pager [phone]  If 7PM-7AM, please contact night-coverage [email or web address] Password [email or web address] 07/22/2014, 2:30 PM    LOS: 11 days

## 2014-07-23 ENCOUNTER — Inpatient Hospital Stay (HOSPITAL_COMMUNITY): Payer: Medicare Other

## 2014-07-23 DIAGNOSIS — E43 Unspecified severe protein-calorie malnutrition: Secondary | ICD-10-CM

## 2014-07-23 LAB — TYPE AND SCREEN
ABO/RH(D): A POS
Antibody Screen: NEGATIVE
UNIT DIVISION: 0
Unit division: 0

## 2014-07-23 LAB — CBC
HEMATOCRIT: 32 % — AB (ref 36.0–46.0)
HEMOGLOBIN: 10.5 g/dL — AB (ref 12.0–15.0)
MCH: 28.8 pg (ref 26.0–34.0)
MCHC: 32.8 g/dL (ref 30.0–36.0)
MCV: 87.7 fL (ref 78.0–100.0)
Platelets: 247 10*3/uL (ref 150–400)
RBC: 3.65 MIL/uL — AB (ref 3.87–5.11)
RDW: 14.5 % (ref 11.5–15.5)
WBC: 6.4 10*3/uL (ref 4.0–10.5)

## 2014-07-23 LAB — BASIC METABOLIC PANEL
Anion gap: 12 (ref 5–15)
BUN: 20 mg/dL (ref 6–23)
CO2: 20 meq/L (ref 19–32)
Calcium: 8.5 mg/dL (ref 8.4–10.5)
Chloride: 110 mEq/L (ref 96–112)
Creatinine, Ser: 1.61 mg/dL — ABNORMAL HIGH (ref 0.50–1.10)
GFR calc Af Amer: 31 mL/min — ABNORMAL LOW (ref >90)
GFR calc non Af Amer: 26 mL/min — ABNORMAL LOW (ref >90)
GLUCOSE: 86 mg/dL (ref 70–99)
Potassium: 3.5 mEq/L — ABNORMAL LOW (ref 3.7–5.3)
SODIUM: 142 meq/L (ref 137–147)

## 2014-07-23 MED ORDER — GLUCAGON HCL RDNA (DIAGNOSTIC) 1 MG IJ SOLR
INTRAMUSCULAR | Status: AC
  Filled 2014-07-23: qty 1

## 2014-07-23 MED ORDER — MIDAZOLAM HCL 2 MG/2ML IJ SOLN
INTRAMUSCULAR | Status: AC | PRN
  Administered 2014-07-23: 0.5 mg via INTRAVENOUS
  Administered 2014-07-23: 1 mg via INTRAVENOUS

## 2014-07-23 MED ORDER — LIDOCAINE HCL 1 % IJ SOLN
INTRAMUSCULAR | Status: AC
  Filled 2014-07-23: qty 20

## 2014-07-23 MED ORDER — VANCOMYCIN HCL IN DEXTROSE 1-5 GM/200ML-% IV SOLN
1000.0000 mg | INTRAVENOUS | Status: AC
  Administered 2014-07-23: 1000 mg via INTRAVENOUS
  Filled 2014-07-23 (×2): qty 200

## 2014-07-23 MED ORDER — HYDROCODONE-ACETAMINOPHEN 5-325 MG PO TABS: 1.0000 | ORAL_TABLET | ORAL | Status: DC | PRN

## 2014-07-23 MED ORDER — MIDAZOLAM HCL 2 MG/2ML IJ SOLN
INTRAMUSCULAR | Status: AC
  Filled 2014-07-23: qty 6

## 2014-07-23 MED ORDER — FENTANYL CITRATE 0.05 MG/ML IJ SOLN
INTRAMUSCULAR | Status: AC | PRN
  Administered 2014-07-23 (×2): 25 ug via INTRAVENOUS

## 2014-07-23 MED ORDER — ONDANSETRON HCL 4 MG/2ML IJ SOLN: 4.0000 mg | INTRAMUSCULAR | Status: DC | PRN

## 2014-07-23 MED ORDER — FENTANYL CITRATE 0.05 MG/ML IJ SOLN
INTRAMUSCULAR | Status: AC
  Filled 2014-07-23: qty 6

## 2014-07-23 MED ORDER — HYDROMORPHONE HCL 1 MG/ML IJ SOLN: 1.0000 mg | INTRAMUSCULAR | Status: DC | PRN

## 2014-07-23 NOTE — Progress Notes (Signed)
CSW following for discharge to [facility] when medically ready. CSW has completed FL2 & will continue to follow and assist with discharge planning.    Lincoln Maxin, LCSW [facility] Clinical Social Worker cell #: [phone]

## 2014-07-23 NOTE — Progress Notes (Signed)
NUTRITION FOLLOW UP  Intervention:   As tolerated: -Initiate Osmolite 1.2 @ 20 ml/hr via PEG and increase by 10 ml every 8  hours to goal rate of 60 ml/hr.  -Tube feeding regimen provides 1728 kcal (100% of needs), 80 grams of protein, and 1181 ml of H2O. Pt will need additional 600-800 ml free water -Recommend refeeding labs upon initiation of TF; can increase advancement rate to 10 ml every 4 hours if Phos/Mg/K WNL -RD to continue to monitor  Nutrition Dx:   Inadequate oral intake related to inability to eat as evidenced by NPO status; ongoing  Goal:   TF to meet >/= 90% of their estimated nutrition needs    Monitor:   TF tolerance/order, labs, weights, diet order, GI profile, skin integrity  Assessment:   9/08:  Patient identified as Braden Score < 12. Currently NPO as pt has been pocketing food and high risk for aspiration. Plan of care currently pending to determine tube feeding/agressive care vs palliative for comfort oriented care.   9/11:  -GOC meeting occurred today to determine further course of action. Per discussion with RN, it is likely PEG tube will be placed. RD will place recommendations to use as warranted. Expressed concern for risk of refeeding as pt with hx of weight loss, and prolonged period of minimal nutritional intake (0% PO intake for > one week)  -Pt nonverbal and unable to provide food/nutrition hx. Per previous medical records, pt has lost approximately 35 lbs in past 3 months (19% body weight loss, severe for time frame) likely d/t decreased intake reported from SNF  -WOC evaluated pt on 9/06 for stage III PU on bilateral buttocks, and unstageable PU on left heel  -Phos WNL, K Low. Recommend refeeding labs for 24-48 hrs upon initiation of TF, and increase advancement rate of TF as warranted  9/16: -Plan for pt to have PEG placed on 9/15; however hg dropped to 7.2, and required transfusion of two units prbc per discussion with RN. Feeding tube placement  held. -Pt underwent PEG placement today w/out complication -Hypernatremia improved with IVF -Plan to d/c to SNF once pt tolerating TF. Will place recommendations with conservative advance d/t refeeding risk; and will modify advancement rate once Phos/K/Mg WNL and per pt's tolerance  Height: Ht Readings from Last 1 Encounters:  07/12/14  (1.651 m)    Weight Status:   Wt Readings from Last 1 Encounters:  07/12/14 150 lb 2.1 oz (68.1 kg)    Re-estimated needs:  Kcal: 1500-1700  Protein: 80-90 gram  Fluid: >/=1700 ml/daily    Skin: stg 3 bilateral PU on buttock, unstageable heel ulcer   Diet Order: NPO   Intake/Output Summary (Last 24 hours) at 07/23/14 1521 Last data filed at 07/23/14 1416  Gross per 24 hour  Intake 1093.75 ml  Output    450 ml  Net 643.75 ml    Last BM: 9/09   Labs:   Recent Labs Lab 07/21/14 0317 07/22/14 0345 07/23/14 0440  NA 138 137 142  K 3.5* 3.4* 3.5*  CL 105 105 110  CO2 BUN 33* 26* 20  CREATININE 1.84* 1.72* 1.61*  CALCIUM 8.6 8.4 8.5  GLUCOSE 109* 92 86    CBG (last 3)  No results found for this basename: GLUCAP,  in the last 72 hours  Scheduled Meds: . sodium chloride   Intravenous Once  . Chlorhexidine Gluconate Cloth  6 each Topical Q0600  . sennosides  10 mL  Oral QHS   And  . docusate  100 mg Oral QHS  . fentaNYL  12.5 mcg Transdermal Q72H  . fentaNYL      . glucagon (human recombinant)      . lidocaine      . LORazepam  0.5 mg Oral BID  . midazolam      . mupirocin ointment   Nasal BID  . sodium chloride  3 mL Intravenous Q12H    Continuous Infusions: . dextrose 5 % and 0.45% NaCl 1,000 mL infusion 50 mL/hr at 07/23/14 0344    Lloyd Huger MS RD LDN Clinical Dietitian Pager:[phone]

## 2014-07-23 NOTE — Progress Notes (Signed)
PROGRESS NOTE    Lindsay Browning ZOX:[id number] DOB: 23-Oct-1919 DOA: 07/11/2014 PCP: Lindsay Jenny, MD  Subjective: Nonverbal, appears comfortable.  HPI/Brief narrative 80 year old female patient, SNF resident, has DSS guardian, l advanced dementia & nonverbal, hypertension, chronic kidney disease, sacral and right foot decubitus ulcers, transferred from nursing home secondary to progressive decrease in functioning, fatigue, weight loss, not eating. In the ED, found to be hypernatremic with sodium 157 and acute renal failure with creatinine 3.78. Patient DO NOT RESUSCITATE. Unable to get IV access>IO>PICC line. Acute renal failure is improving. Poor candidate for by mouth intake secondary to high aspiration risk related to mental status changes and pocketing food in the mouth. Palliative care consultation was obtained, and goals of care meeting was done with patient's daughter and DSS to Director. Ms. Lindsay Browning, but decided to go ahead with PEG tube trial to see if patient improves with nutrition.   PEG tube not place yesterday, for placement today.   Assessment/Plan:  1. Hypernatremic dehydration:  Resolved, Secondary to poor oral intake related to advanced dementia. Hypernatremia had resolved. Continue gentle IV fluids. N.p.o. >patient pocketing food in the mouth and high risk for aspiration.  2. Acute on stage III chronic kidney disease: Improving, Cr is now down to 1.72 Secondary to problem #1.Oliguric. Foley placed 9/5 for close intake and output monitoring secondary to incontinence. Creatinine has slowly improved . Renal ultrasound without hydronephrosis. Continue gentle IV fluids. Urine output improving.   3. Stage III sacral decubitus and right foot pressure ulcers: Management per wound care consultation.  4. Advanced dementia: Mental status may be close to baseline. Was on regular diet at SNF and here initially felt able to tolerate medications with applesauce. Speech therapy evaluated  patient and recommended dysphagia 1 diet and nectar thick liquids. Patient at high risk of aspiration secondary to advanced dementia. Patient pocketing food in the mouth-changed to n.p.o. on 9/7. Discussed with DSS guardian Lindsay Browning on 9/8-she will discuss with her supervisor and get back to [place] regarding further course of action-she stated that they may consider tube feeding. Advised her that tube feeding for this elderly frail patient with advanced dementia may not be the best option. It may prolong life but will not change prior poor quality of life and it has its own complications including pain, infection, bleeding and aspiration risks. Await call back. As per guardian, progressive MS decline since June 2015.  5. Hypertension: Controlled. Currently off medications.  6. Anemia: Hemoglobin dropped from 10.9 > 7.5, will check stool for occult blood.Stool for occult blood is still pending, as patient did not have BM. Today the hb dropped to 7.2, will transfuse two units prbc.  7. Hypotension- patient became hypotensive on 9/15 systolic blood pressure dropped to 76, she is nonverbal though alert. Will give 1 L of normal saline bolus.  8. Dysphagia: NPO, will get IR consult for peg tube placement.   9. Elevated TSH:  F T4 0.77, F T3 1.8, Will need to repeat the thyroid studies in 6-8 weeks   Code Status: DO NOT RESUSCITATE  Family Communication: .  Disposition Plan: SNF   Consultants:  None  Procedures:  IO line 9/5 > 9/5  Foley catheter-9/5 >  Antibiotics:  None    Objective: Filed Vitals:   07/23/14 0207 07/23/14 0300 07/23/14 0550 07/23/14 1236  BP: 120/69 150/76 140/87 170/89  Pulse: 92 88 84 95  Temp: 97.3 F (36.3 C) 98.4 F (36.9 C) 98.9 F (37.2 C)  TempSrc: Axillary Axillary Oral   Resp: Height:      Weight:      SpO2: 100% 100% 100% 100%    Intake/Output Summary (Last 24 hours) at 07/23/14 1239 Last data filed at 07/23/14 1143  Gross per  24 hour  Intake 1493.75 ml  Output    400 ml  Net 1093.75 ml   Filed Weights   07/12/14 0900  Weight: 68.1 kg (150 lb 2.1 oz)     Exam:  Physical Exam: Head: Normocephalic, atraumatic.  Eyes: No signs of jaundice, EOMI Nose: Mucous membranes dry.  Lungs: Normal respiratory effort. B/L Clear to auscultation, no crackles or wheezes.  Heart: Regular RR. S1 and S2 normal  Abdomen: BS normoactive. Soft, Nondistended, non-tender.  Extremities: No pretibial edema, no erythema     Data Reviewed: Basic Metabolic Panel:  Recent Labs Lab 07/19/14 0455 07/21/14 0317 07/22/14 0345 07/23/14 0440  NA 139 138 137 142  K 3.5* 3.5* 3.4* 3.5*  CL 106 105 105 110  CO2 GLUCOSE 88 109* 92 86  BUN 47* 33* 26* 20  CREATININE 2.17* 1.84* 1.72* 1.61*  CALCIUM 8.4 8.6 8.4 8.5   Liver Function Tests:  Recent Labs Lab 07/21/14 0317  AST 36  ALT 9  ALKPHOS 75  BILITOT 0.2*  PROT 6.7  ALBUMIN 1.7*   No results found for this basename: LIPASE, AMYLASE,  in the last 168 hours No results found for this basename: AMMONIA,  in the last 168 hours CBC:  Recent Labs Lab 07/19/14 0455 07/21/14 0317 07/22/14 0345 07/23/14 0440  WBC 5.2 5.1 4.8 6.4  HGB 7.5* 7.5* 7.2* 10.5*  HCT 22.8* 23.2* 22.1* 32.0*  MCV 90.1 89.2 89.5 87.7  PLT 241 285 275 247     No results found for this or any previous visit (from the past 240 hour(s)).     Studies: No results found.      Scheduled Meds: . sodium chloride   Intravenous Once  . Chlorhexidine Gluconate Cloth  6 each Topical Q0600  . sennosides  10 mL Oral QHS   And  . docusate  100 mg Oral QHS  . fentaNYL  12.5 mcg Transdermal Q72H  . fentaNYL      . lidocaine      . LORazepam  0.5 mg Oral BID  . midazolam      . mupirocin ointment   Nasal BID  . sodium chloride  3 mL Intravenous Q12H  . vancomycin  1,000 mg Intravenous On Call   Continuous Infusions: . dextrose 5 % and 0.45% NaCl 1,000 mL infusion 50 mL/hr  at 07/23/14 0344    Principal Problem:   Dehydration with hypernatremia Active Problems:   Sacral decubitus ulcer, stage III   Decubital ulcer   Hypertension   Unspecified hypothyroidism   Acute on chronic renal failure   Hyperkalemia   Protein-calorie malnutrition, severe   Dysphagia, unspecified(787.20)    Time spent: 30 minutes.    [facility], [facility] Pager [phone]  If 7PM-7AM, please contact night-coverage [email or web address] Password [email or web address] 07/23/2014, 12:39 PM    LOS: 12 days

## 2014-07-23 NOTE — Progress Notes (Signed)
LORazepam (ATIVAN) 2 MG/ML concentrated solution  sent via secure tube from pharmacy. Pt dose is Ativan 0.5mg  was given. Wasted Ativan 1.5 mg PO in sink, witnessed by Judithe Modest, RN.

## 2014-07-23 NOTE — Procedures (Signed)
22F gastrostomy tube placement under fluoro No complication No blood loss. See complete dictation in [facility] PACS.  

## 2014-07-24 DIAGNOSIS — G894 Chronic pain syndrome: Secondary | ICD-10-CM

## 2014-07-24 LAB — CBC
HCT: 36 % (ref 36.0–46.0)
Hemoglobin: 11.9 g/dL — ABNORMAL LOW (ref 12.0–15.0)
MCH: 28.8 pg (ref 26.0–34.0)
MCHC: 33.1 g/dL (ref 30.0–36.0)
MCV: 87.2 fL (ref 78.0–100.0)
PLATELETS: 262 10*3/uL (ref 150–400)
RBC: 4.13 MIL/uL (ref 3.87–5.11)
RDW: 15.1 % (ref 11.5–15.5)
WBC: 14.2 10*3/uL — AB (ref 4.0–10.5)

## 2014-07-24 LAB — BASIC METABOLIC PANEL
Anion gap: 12 (ref 5–15)
BUN: 20 mg/dL (ref 6–23)
CO2: 19 mEq/L (ref 19–32)
Calcium: 8.6 mg/dL (ref 8.4–10.5)
Chloride: 108 mEq/L (ref 96–112)
Creatinine, Ser: 1.55 mg/dL — ABNORMAL HIGH (ref 0.50–1.10)
GFR calc Af Amer: 32 mL/min — ABNORMAL LOW (ref >90)
GFR, EST NON AFRICAN AMERICAN: 28 mL/min — AB (ref >90)
Glucose, Bld: 126 mg/dL — ABNORMAL HIGH (ref 70–99)
Potassium: 3.5 mEq/L — ABNORMAL LOW (ref 3.7–5.3)
SODIUM: 139 meq/L (ref 137–147)

## 2014-07-24 MED ORDER — FENTANYL 12 MCG/HR TD PT72: 12.5000 ug | MEDICATED_PATCH | TRANSDERMAL | Status: DC

## 2014-07-24 MED ORDER — LORAZEPAM 0.5 MG PO TABS: 0.5000 mg | ORAL_TABLET | Freq: Two times a day (BID) | ORAL | Status: DC

## 2014-07-24 MED ORDER — OXYCODONE-ACETAMINOPHEN 5-325 MG PO TABS: 1.0000 | ORAL_TABLET | Freq: Four times a day (QID) | ORAL | Status: DC | PRN

## 2014-07-24 NOTE — Progress Notes (Signed)
1645 Spoke with [name] at [place] to give report.   Social Worker notified for ambulance.   Lindsay Browning

## 2014-07-24 NOTE — Progress Notes (Signed)
Patient is set to discharge to [facility] today. Patient's guardian, [name] (ph#: [phone]) aware. Discharge packet in [place] - RN, [place] aware. [facility] called for transport.   Clinical Social Work Department CLINICAL SOCIAL WORK PLACEMENT NOTE 07/24/2014  Patient:  Lindsay Browning, Lindsay Browning  Account Number:  [id number] Admit date:  07/11/2014  Clinical Social Worker:  Unk Lightning, LCSW  Date/time:  07/14/2014 10:45 AM  Clinical Social Work is seeking post-discharge placement for this patient at the following level of care:   SKILLED NURSING   (*CSW will update this form in Epic as items are completed)   07/14/2014  Patient/family provided with [facility] Department of Clinical Social Work's list of facilities offering this level of care within the geographic area requested by the patient (or if unable, by the patient's family).  07/14/2014  Patient/family informed of their freedom to choose among providers that offer the needed level of care, that participate in Medicare, Medicaid or managed care program needed by the patient, have an available bed and are willing to accept the patient.  07/14/2014  Patient/family informed of [facility]' ownership interest in [facility], as well as of the fact that they are under no obligation to receive care at this facility.  PASARR submitted to EDS on 07/18/2014 PASARR number received on 07/18/2014  FL2 transmitted to all facilities in geographic area requested by pt/family on  07/14/2014 FL2 transmitted to all facilities within larger geographic area on   Patient informed that his/her managed care company has contracts with or will negotiate with  certain facilities, including the following:     Patient/family informed of bed offers received:  07/18/2014 Patient chooses bed at [facility] Physician recommends and patient chooses bed at    Patient to be transferred to [facility] on   07/24/2014 Patient to be transferred to facility by [facility] Patient and family notified of transfer on 07/24/2014 Name of family member notified:  patient's guardian, [name] via phone  The following physician request were entered in Epic:   Additional Comments:   Lincoln Maxin, LCSW [facility] Clinical Social Worker cell #: [phone]

## 2014-07-24 NOTE — Progress Notes (Signed)
Agree.  OK to use gastrostomy today.

## 2014-07-24 NOTE — Progress Notes (Signed)
Patient ID: Lindsay Browning, female   DOB: [birth year], 80 y.o.   MRN: [id number]   Referring Physician(s): Dr. Georgiann Mohs  Subjective: pt resting quietly; does not appear distressed; some soreness present at G tube insertion site as expected    Allergies: Penicillins  Medications: Prior to Admission medications   Medication Sig Start Date End Date Taking? Authorizing Provider  ascorbic acid (VITAMIN C) 500 MG tablet Take 1 tablet (500 mg total) by mouth 2 (two) times daily. 11/29/13  Yes Shanker Kristeen Mans, MD  ergocalciferol (VITAMIN D2) 50000 UNITS capsule Take 50,000 Units by mouth every 30 (thirty) days.   Yes Historical Provider, MD  FLUoxetine (PROZAC) 10 MG capsule Take 10 mg by mouth daily.   Yes Historical Provider, MD  Multiple Vitamins-Minerals (CERTA-VITE PO) Take 1 tablet by mouth daily.    Yes Historical Provider, MD  nebivolol (BYSTOLIC) 10 MG tablet Take 10 mg by mouth daily.   Yes Historical Provider, MD  QUEtiapine (SEROQUEL) 25 MG tablet Take 25 mg by mouth 2 (two) times daily.    Yes Historical Provider, MD  senna-docusate (SENOKOT-S) 8.6-50 MG per tablet Take 2 tablets by mouth at bedtime. 11/29/13  Yes Shanker Kristeen Mans, MD  fentaNYL (DURAGESIC - DOSED MCG/HR) 12 MCG/HR Place 1 patch (12.5 mcg total) onto the skin every 3 (three) days. 07/24/14   Verlee Monte, MD  lidocaine (LIDODERM) 5 % Place 1 patch onto the skin daily. Apply to left knee, Remove & Discard patch within 12 hours or as directed by MD    Historical Provider, MD  LORazepam (ATIVAN) 0.5 MG tablet Take 1 tablet (0.5 mg total) by mouth 2 (two) times daily. 07/24/14   Verlee Monte, MD  NONFORMULARY OR COMPOUNDED ITEM Apply 1 mL topically 2 (two) times daily as needed (for anxiety or agitation). Lorazepam 1MG /1ML    Historical Provider, MD  oxyCODONE-acetaminophen (PERCOCET/ROXICET) 5-325 MG per tablet Take 1-2 tablets by mouth every 6 (six) hours as needed for severe pain. 07/24/14   Verlee Monte, MD    Review of  Systems  Vital Signs: BP 136/85  Pulse 99  Temp(Src) 98.2 F (36.8 C) (Oral)  Resp 20  Ht 5\' 5"  (1.651 m)  Wt 150 lb 2.1 oz (68.1 kg)  BMI 24.98 kg/m2  SpO2 100%  Physical Exam pt awake; appears comfortable; G tube intact, dressing dry, abd soft,+BS, mildly tender,ND  Imaging: Ir Gastrostomy Tube Mod Sed  07/23/2014   CLINICAL DATA:  Severe protein calorie malnutrition, needs enteral feeding support  EXAM: PERC PLACEMENT GASTROSTOMY  FLUOROSCOPY TIME:  2 min 48 seconds  TECHNIQUE: The procedure, risks, benefits, and alternatives were explained to the power of attorney. Questions regarding the procedure were encouraged and answered. The heart returning understands and consents to the procedure. As antibiotic prophylaxis, vancomycin 1 g was ordered pre-procedure and administered intravenously within one hour of incision.Progression of previously administered oral barium into the colon was confirmed fluoroscopically. A 5 French angiographic catheter was placed as orogastric tube. The upper abdomen was prepped with Betadine, draped in usual sterile fashion, and infiltrated locally with 1% lidocaine.  Intravenous Fentanyl and Versed were administered as conscious sedation during continuous cardiorespiratory monitoring by the radiology RN, with a total moderate sedation time of less than 30 minutes.  Stomach was insufflated using air through the orogastric tube. 0.5 mg glucagon administered intravenously to facilitate gastric distention. An 50 French sheath needle was advanced percutaneously into the gastric lumen under fluoroscopy. Gas could be aspirated  and a small contrast injection confirmed intraluminal spread. The sheath was exchanged over a guidewire for a 9 [place] vascular sheath, through which the snare device was advanced and used to snare a guidewire passed through the orogastric tube. This was withdrawn, and the snare attached to the 20 French pull-through gastrostomy tube, which was advanced  antegrade, positioned with the internal bumper securing the anterior gastric wall to the anterior abdominal wall. Small contrast injection confirms appropriate positioning. The external bumper was applied and the catheter was flushed. No immediate complication.  IMPRESSION: 1. Technically successful 20 French pull-through gastrostomy placement under fluoroscopy.   Electronically Signed   By: Arne Cleveland M.D.   On: 07/23/2014 13:53    Labs: Results for orders placed during the hospital encounter of 07/11/14 (from the past 48 hour(s))  PREPARE RBC (CROSSMATCH)     Status: None   Collection Time    07/22/14  3:00 PM      Result Value Ref Range   Order Confirmation ORDER PROCESSED BY BLOOD BANK    TYPE AND SCREEN     Status: None   Collection Time    07/22/14  5:10 PM      Result Value Ref Range   ABO/RH(D) A POS     Antibody Screen NEG     Sample Expiration 07/25/2014     Unit Number [id number]     Blood Component Type RED CELLS,LR     Unit division 00     Status of Unit ISSUED,FINAL     Transfusion Status OK TO TRANSFUSE     Crossmatch Result Compatible     Unit Number [id number]     Blood Component Type RED CELLS,LR     Unit division 00     Status of Unit ISSUED,FINAL     Transfusion Status OK TO TRANSFUSE     Crossmatch Result Compatible    ABO/RH     Status: None   Collection Time    07/22/14  6:01 PM      Result Value Ref Range   ABO/RH(D) A POS    CBC     Status: Abnormal   Collection Time    07/23/14  4:40 AM      Result Value Ref Range   WBC 6.4  4.0 - 10.5 K/uL   RBC 3.65 (*) 3.87 - 5.11 MIL/uL   Hemoglobin 10.5 (*) 12.0 - 15.0 g/dL   Comment: DELTA CHECK NOTED     REPEATED TO VERIFY   HCT 32.0 (*) 36.0 - 46.0 %   MCV 87.7  78.0 - 100.0 fL   MCH 28.8  26.0 - 34.0 pg   MCHC 32.8  30.0 - 36.0 g/dL   RDW 14.5  11.5 - 15.5 %   Platelets 247  150 - 400 K/uL  BASIC METABOLIC PANEL     Status: Abnormal   Collection Time    07/23/14  4:40 AM      Result  Value Ref Range   Sodium 142  137 - 147 mEq/L   Potassium 3.5 (*) 3.7 - 5.3 mEq/L   Chloride 110  96 - 112 mEq/L   CO2 20  19 - 32 mEq/L   Glucose, Bld 86  70 - 99 mg/dL   BUN 20  6 - 23 mg/dL   Creatinine, Ser 1.61 (*) 0.50 - 1.10 mg/dL   Calcium 8.5  8.4 - 10.5 mg/dL   GFR calc non Af Amer 26 (*) >90 mL/min  GFR calc Af Amer 31 (*) >90 mL/min   Comment: (NOTE)     The eGFR has been calculated using the CKD EPI equation.     This calculation has not been validated in all clinical situations.     eGFR's persistently <90 mL/min signify possible Chronic Kidney     Disease.   Anion gap 12  5 - 15  BASIC METABOLIC PANEL     Status: Abnormal   Collection Time    07/24/14  5:10 AM      Result Value Ref Range   Sodium 139  137 - 147 mEq/L   Potassium 3.5 (*) 3.7 - 5.3 mEq/L   Chloride 108  96 - 112 mEq/L   CO2 19  19 - 32 mEq/L   Glucose, Bld 126 (*) 70 - 99 mg/dL   BUN 20  6 - 23 mg/dL   Creatinine, Ser 1.55 (*) 0.50 - 1.10 mg/dL   Calcium 8.6  8.4 - 10.5 mg/dL   GFR calc non Af Amer 28 (*) >90 mL/min   GFR calc Af Amer 32 (*) >90 mL/min   Comment: (NOTE)     The eGFR has been calculated using the CKD EPI equation.     This calculation has not been validated in all clinical situations.     eGFR's persistently <90 mL/min signify possible Chronic Kidney     Disease.   Anion gap 12  5 - 15  CBC     Status: Abnormal   Collection Time    07/24/14  5:10 AM      Result Value Ref Range   WBC 14.2 (*) 4.0 - 10.5 K/uL   RBC 4.13  3.87 - 5.11 MIL/uL   Hemoglobin 11.9 (*) 12.0 - 15.0 g/dL   HCT 36.0  36.0 - 46.0 %   MCV 87.2  78.0 - 100.0 fL   MCH 28.8  26.0 - 34.0 pg   MCHC 33.1  30.0 - 36.0 g/dL   RDW 15.1  11.5 - 15.5 %   Platelets 262  150 - 400 K/uL    Assessment and Plan: s/p uncomplicated perc gastrostomy tube 9/16; ok to use tube; WBC elevation noted ?origin- monitor, ?recheck urine cx   Rowe Robert, PAC

## 2014-07-24 NOTE — Discharge Summary (Signed)
Physician Discharge Summary  Lindsay Browning:[id number] DOB: 31-Aug-1919 DOA: 07/11/2014  PCP: Florentina Jenny, MD  Admit date: 07/11/2014 Discharge date: 07/24/2014  Time spent: 40 minutes  Recommendations for Outpatient Follow-up:  1. Followup with nursing home M.D. within one week.  2. Check CBC/BMP in one week.  Discharge Diagnoses:  Principal Problem:   Dehydration with hypernatremia Active Problems:   Sacral decubitus ulcer, stage III   Decubital ulcer   Hypertension   Unspecified hypothyroidism   Acute on chronic renal failure   Hyperkalemia   Protein-calorie malnutrition, severe   Dysphagia, unspecified(787.20)   Discharge Condition: Stable  Diet recommendation:  -N.p.o. with mouth care, initiate  -Osmolite 1.2 @ 20 mL per hour via PEG tube and increase by 10 mL every 8 hours to goal rate of 60 mL/hour. -Free water flushes 150 mL every 6 hours  Filed Weights   07/12/14 0900  Weight: 68.1 kg (150 lb 2.1 oz)    History of present illness:  Lindsay Browning is a 80 y.o. female  has a past medical history of Medical history unknown; Hypertension; and Renal disorder.  Patient resides at [facility] She is nonverbal. Per staff patient had had progressive decrease in functioning increased fatigue, weight loss, stopped eating and was brought in to emerge department. Patient was found to be hypernatremic with sodium up to 157 and in acute renal failure with creatinine 3.78 from baseline of 1.3  No family at bedside  Hospitalist was called for admission for hypernatremia, dehydration and acute on chronic renal.  Hospital Course:   80 year old female patient, SNF resident, has DSS guardian, l advanced dementia & nonverbal, hypertension, chronic kidney disease, sacral and right foot decubitus ulcers, transferred from nursing home secondary to progressive decrease in functioning, fatigue, weight loss, not eating. In the ED, found to be hypernatremic with sodium 157 and acute  renal failure with creatinine 3.78. Patient DO NOT RESUSCITATE. Unable to get IV access>IO>PICC line. Acute renal failure is improving. Poor candidate for by mouth intake secondary to high aspiration risk related to mental status changes and pocketing food in the mouth. Palliative care consultation was obtained, and goals of care meeting was done with patient's daughter and DSS to Director. Lindsay Browning, but decided to go ahead with PEG tube trial to see if patient improves with nutrition.   Discharge summary based on problems:  Hypernatremic dehydration: Resolved, Secondary to poor oral intake related to advanced dementia. Hypernatremia had resolved. This is resolved at the time of discharge patient will be placed on tube feeding and free water flushes, BMP should be done within a week to check for sodium and renal function.   Acute on stage III chronic kidney disease: Patient creatinine baseline is 1.3 from August of 2015, presented with creatinine of 3.78 likely secondary to dehydration. After IV fluid hydration creatinine went down to 1.5 on the day of discharge.  Stage III sacral decubitus and right foot pressure ulcers: Management per wound care consultation. Recommended topical wound care of the heel with silicone dressing and pressure distribution, the buttock ulceration require calcium alginate dressing.  Advanced dementia: Mental status may be close to baseline. Was on regular diet at SNF and here initially felt able to tolerate medications with applesauce. Speech therapy evaluated patient and recommended dysphagia 1 diet and nectar thick liquids. Patient at high risk of aspiration secondary to advanced dementia. Patient pocketing food in the mouth-changed to n.p.o. on 9/7 it was discussed with DSS guardian Lindsay Browning  on 9/8-she will discuss with her supervisor and get back to [place] regarding further course of action-she stated that they may consider tube feeding. They were been advised by Dr. Sharl Ma  her that tube feeding for this elderly frail patient with advanced dementia may not be the best option. It may prolong life but will not change prior poor quality of life and it has its own complications including pain, infection, bleeding and aspiration risks. The family and guardian recommended 1 expected to be placed, x-ray placed on 9/16.  Hypertension: Controlled. Currently off medications.  Anemia: Hemoglobin dropped from 10.9 > 7.5, will check stool for occult blood.Stool for occult blood is still pending, as patient did not have BM. Had transfusion of units of packed RBCs on the 15th, he hemoglobin increased to 10.5 from the 16th and 11.9 on the day of discharge.  Hypotension- patient became hypotensive on 9/15 systolic blood pressure dropped to 76, she is nonverbal though alert. 1 L of normal saline given, this is resolved.  Dysphagia: NPO, PEG tube placed on 07/23/2014.  Elevated TSH: F T4 0.77, F T3 1.8, Will need to repeat the thyroid studies in 6-8 weeks  Leukocytosis: The day of discharge her WBC increased to 14.2 from 6.4, there is no evidence of fever or chills. This is probably stress leukocytosis replacement of the tube. CBC to be checked within one week.  Procedures:  Placement of PEG tube on 07/23/14  Consultations:  Interventional radiology.  Palliative medicine team  Discharge Exam: Filed Vitals:   07/24/14 0548  BP: 136/85  Pulse: 99  Temp: 98.2 F (36.8 C)  Resp: 20   General: Alert and awake, oriented x3, not in any acute distress. HEENT: anicteric sclera, pupils reactive to light and accommodation, EOMI CVS: S1-S2 clear, no murmur rubs or gallops Chest: clear to auscultation bilaterally, no wheezing, rales or rhonchi Abdomen: soft nontender, nondistended, normal bowel sounds, no organomegaly Extremities: no cyanosis, clubbing or edema noted bilaterally Neuro: Cranial nerves II-XII intact, no focal neurological deficits  Discharge Instructions You  were cared for by a hospitalist during your hospital stay. If you have any questions about your discharge medications or the care you received while you were in the hospital after you are discharged, you can call the unit and asked to speak with the hospitalist on call if the hospitalist that took care of you is not available. Once you are discharged, your primary care physician will handle any further medical issues. Please note that NO REFILLS for any discharge medications will be authorized once you are discharged, as it is imperative that you return to your primary care physician (or establish a relationship with a primary care physician if you do not have one) for your aftercare needs so that they can reassess your need for medications and monitor your lab values.  Discharge Instructions   Increase activity slowly    Complete by:  As directed           Current Discharge Medication List    CONTINUE these medications which have CHANGED   Details  fentaNYL (DURAGESIC - DOSED MCG/HR) 12 MCG/HR Place 1 patch (12.5 mcg total) onto the skin every 3 (three) days. Qty: 1 patch, Refills: 0    LORazepam (ATIVAN) 0.5 MG tablet Take 1 tablet (0.5 mg total) by mouth 2 (two) times daily. Qty: 6 tablet, Refills: 0    oxyCODONE-acetaminophen (PERCOCET/ROXICET) 5-325 MG per tablet Take 1-2 tablets by mouth every 6 (six) hours as needed for  severe pain. Qty: 9 tablet, Refills: 0      CONTINUE these medications which have NOT CHANGED   Details  ascorbic acid (VITAMIN C) 500 MG tablet Take 1 tablet (500 mg total) by mouth 2 (two) times daily.    ergocalciferol (VITAMIN D2) 50000 UNITS capsule Take 50,000 Units by mouth every 30 (thirty) days.    FLUoxetine (PROZAC) 10 MG capsule Take 10 mg by mouth daily.    Multiple Vitamins-Minerals (CERTA-VITE PO) Take 1 tablet by mouth daily.     nebivolol (BYSTOLIC) 10 MG tablet Take 10 mg by mouth daily.    QUEtiapine (SEROQUEL) 25 MG tablet Take 25 mg by  mouth 2 (two) times daily.     senna-docusate (SENOKOT-S) 8.6-50 MG per tablet Take 2 tablets by mouth at bedtime.    lidocaine (LIDODERM) 5 % Place 1 patch onto the skin daily. Apply to left knee, Remove & Discard patch within 12 hours or as directed by MD    NONFORMULARY OR COMPOUNDED ITEM Apply 1 mL topically 2 (two) times daily as needed (for anxiety or agitation). Lorazepam /1ML       Allergies  Allergen Reactions  . Penicillins Other (See Comments)    Unknown - pt states she's never taken PCN before, per [facility]      The results of significant diagnostics from this hospitalization (including imaging, microbiology, ancillary and laboratory) are listed below for reference.    Significant Diagnostic Studies: Ct Abdomen Wo Contrast  07-29-14   CLINICAL DATA:  Preop for G-tube placement. Evaluate position of stomach.  EXAM: CT ABDOMEN WITHOUT CONTRAST  TECHNIQUE: Multidetector CT imaging of the abdomen was performed following the standard protocol without IV contrast.  COMPARISON:  None.  FINDINGS: The lung bases are grossly clear. The heart is normal in size. A small to moderate-sized hiatal hernia is noted.  The liver is grossly normal without contrast. No biliary dilatation. The gallbladder is unremarkable. Layering slightly denser material in the gallbladder may be sludge. The pancreas is grossly normal. The spleen is normal in size. No focal lesions. The adrenal glands should knees are unremarkable except for a large right upper pole renal cyst.  The stomach is located normally just below the xiphoid process. The transverse colon is located just inferior to the stomach. No mesenteric or retroperitoneal mass or adenopathy. There is tortuosity and calcification of the thoracic aorta. The bony structures are intact.  IMPRESSION: Unremarkable CT abdomen.   Electronically Signed   By: Loralie Champagne M.D.   On: Jul 29, 2014 01:48   Ir Gastrostomy Tube Mod Sed  07/23/2014   CLINICAL DATA:   Severe protein calorie malnutrition, needs enteral feeding support  EXAM: PERC PLACEMENT GASTROSTOMY  FLUOROSCOPY TIME:  2 min 48 seconds  TECHNIQUE: The procedure, risks, benefits, and alternatives were explained to the power of attorney. Questions regarding the procedure were encouraged and answered. The heart returning understands and consents to the procedure. As antibiotic prophylaxis, vancomycin 1 g was ordered pre-procedure and administered intravenously within one hour of incision.Progression of previously administered oral barium into the colon was confirmed fluoroscopically. A 5 French angiographic catheter was placed as orogastric tube. The upper abdomen was prepped with Betadine, draped in usual sterile fashion, and infiltrated locally with 1% lidocaine.  Intravenous Fentanyl and Versed were administered as conscious sedation during continuous cardiorespiratory monitoring by the radiology RN, with a total moderate sedation time of less than 30 minutes.  Stomach was insufflated using air through the orogastric tube. 0.5  mg glucagon administered intravenously to facilitate gastric distention. An 57 French sheath needle was advanced percutaneously into the gastric lumen under fluoroscopy. Gas could be aspirated and a small contrast injection confirmed intraluminal spread. The sheath was exchanged over a guidewire for a 9 [place] vascular sheath, through which the snare device was advanced and used to snare a guidewire passed through the orogastric tube. This was withdrawn, and the snare attached to the 20 French pull-through gastrostomy tube, which was advanced antegrade, positioned with the internal bumper securing the anterior gastric wall to the anterior abdominal wall. Small contrast injection confirms appropriate positioning. The external bumper was applied and the catheter was flushed. No immediate complication.  IMPRESSION: 1. Technically successful 20 French pull-through gastrostomy placement under  fluoroscopy.   Electronically Signed   By: Oley Balm M.D.   On: 07/23/2014 13:53   US Renal  07/14/2014   CLINICAL DATA:  Acute renal failure.  Evaluate for hydronephrosis.  EXAM: RENAL/URINARY TRACT ULTRASOUND COMPLETE  COMPARISON:  None.  FINDINGS: Right Kidney:  Length: 8.4 cm. Parenchymal echogenicity is increased. There is a 7.3 x 5.1 x 7.5 cm anechoic mass with increased through transmission off the upper pole. No solid lesion. No hydronephrosis.  Left Kidney:  Length: 7.4 cm. Parenchymal echogenicity is increased. No hydronephrosis. No focal lesion.  Bladder:  Foley catheter is seen in a decompressed bladder.  IMPRESSION: 1. Small echogenic kidneys, indicative of chronic medical renal disease. 2. Large right renal cyst.   Electronically Signed   By: Leanna Battles M.D.   On: 07/14/2014 12:31   Dg Chest Port 1 View  07/12/2014   CLINICAL DATA:  Dehydration, dementia  EXAM: PORTABLE CHEST - 1 VIEW  COMPARISON:  11/21/2013  FINDINGS: Lungs are essentially clear. No focal consolidation. No pleural effusion or pneumothorax.  The heart is top-normal in size.  IMPRESSION: No evidence of acute cardiopulmonary disease.   Electronically Signed   By: Charline Bills M.D.   On: 07/12/2014 09:06    Microbiology: No results found for this or any previous visit (from the past 240 hour(s)).   Labs: Basic Metabolic Panel:  Recent Labs Lab 07/19/14 0455 07/21/14 0317 07/22/14 0345 07/23/14 0440 07/24/14 0510  NA 139 138 137 142 139  K 3.5* 3.5* 3.4* 3.5* 3.5*  CL 106 105 105 110 108  CO2 GLUCOSE 88 109* 92 86 126*  BUN 47* 33* 26* 20 20  CREATININE 2.17* 1.84* 1.72* 1.61* 1.55*  CALCIUM 8.4 8.6 8.4 8.5 8.6   Liver Function Tests:  Recent Labs Lab 07/21/14 0317  AST 36  ALT 9  ALKPHOS 75  BILITOT 0.2*  PROT 6.7  ALBUMIN 1.7*   No results found for this basename: LIPASE, AMYLASE,  in the last 168 hours No results found for this basename: AMMONIA,  in the last 168  hours CBC:  Recent Labs Lab 07/19/14 0455 07/21/14 0317 07/22/14 0345 07/23/14 0440 07/24/14 0510  WBC 5.2 5.1 4.8 6.4 14.2*  HGB 7.5* 7.5* 7.2* 10.5* 11.9*  HCT 22.8* 23.2* 22.1* 32.0* 36.0  MCV 90.1 89.2 89.5 87.7 87.2  PLT 241 285 275 247 262   Cardiac Enzymes: No results found for this basename: CKTOTAL, CKMB, CKMBINDEX, TROPONINI,  in the last 168 hours BNP: BNP (last 3 results) No results found for this basename: PROBNP,  in the last 8760 hours CBG: No results found for this basename: GLUCAP,  in the last 168 hours  Signed:  Mayan Dolney A  [facility] 07/24/2014, 12:07 PM

## 2014-07-24 NOTE — Progress Notes (Signed)
1615 Attempted to call [facility] to give report. Busy signal

## 2014-07-25 ENCOUNTER — Other Ambulatory Visit: Payer: Self-pay | Admitting: *Deleted

## 2014-07-25 MED ORDER — AMBULATORY NON FORMULARY MEDICATION: Status: DC

## 2014-07-25 NOTE — Telephone Encounter (Signed)
[facility] 

## 2014-07-28 ENCOUNTER — Other Ambulatory Visit: Payer: Self-pay | Admitting: *Deleted

## 2014-07-28 MED ORDER — OXYCODONE-ACETAMINOPHEN 5-325 MG PO TABS: ORAL_TABLET | ORAL | Status: DC

## 2014-07-28 NOTE — Telephone Encounter (Signed)
[facility] 

## 2014-07-29 ENCOUNTER — Non-Acute Institutional Stay (SKILLED_NURSING_FACILITY): Payer: 59 | Admitting: Internal Medicine

## 2014-07-29 ENCOUNTER — Other Ambulatory Visit: Payer: Self-pay | Admitting: *Deleted

## 2014-07-29 DIAGNOSIS — E860 Dehydration: Secondary | ICD-10-CM

## 2014-07-29 DIAGNOSIS — L89309 Pressure ulcer of unspecified buttock, unspecified stage: Secondary | ICD-10-CM

## 2014-07-29 DIAGNOSIS — N179 Acute kidney failure, unspecified: Secondary | ICD-10-CM

## 2014-07-29 DIAGNOSIS — N189 Chronic kidney disease, unspecified: Secondary | ICD-10-CM

## 2014-07-29 MED ORDER — LORAZEPAM 0.5 MG PO TABS: ORAL_TABLET | ORAL | Status: DC

## 2014-07-29 NOTE — Progress Notes (Signed)
Patient ID: Lindsay Browning, female   DOB: 1919/05/11, 80 y.o.   MRN: [id number]  Facility; [facility] Chief complaint; admission to SNF post admit to [facility] from 9/4 to 9/17  History; this is a 80 year old woman I am actually looked after at another facility since January of 2015. At that point she had been admitted [facility] after being found at home immobilized and covered in feces and urine. When she arrived at [facility] nursing home she had a large areas of decubitus ulceration over her bilateral buttocks also severe bilateral involving her hips and knees. On top of all this she was a patient who never really accepted the need for her to be in the facility. She was restless and argumentative with care. Refused physical and occupational therapy at a time where it might have been possible to give her some meaningful function. She was felt to have depression with coexistence anxiety by psychiatry although this was to the point that I thought she might be an unrecognized bipolar patient. In any case her background history was never really clarified. In spite of all this she spends most of her time in bed the. Miraculously the wounds on her buttock healed until roughly about 4-6 weeks prior to this hospitalization when these open again. Her oral intake declined. At that point she was managed in conjunction with the [facility] nurse practitioner. She was being managed as a palliative care patient in the facility with the understanding that this would be communicated with her responsible party. As I understand things her DFS social worker came in the facility and insisted she be sent to the hospital. She was found to be hypernatremic with a sodium up to 157 and a creatinine of 3.78 to. None of this was surprising. After IV fluid resuscitation her discharge creatinine was 1.5.  Although it is stated in her discharge summary that her dementia was advanced, this is probably not true I would  visualize this patient is a patient with mild to moderate dementia and background psychiatric illness. Psychiatric illness was probably never diagnosed.  Past medical history #1 decubitus ulcers currently stage III #2 hypertension #3 protein calorie malnutrition now with a PEG tube #4 dysphagia #5 hypothyroidism #6 dementia #7 severe lower extremity osteoarthritis now with flexion contractures  Discharge medication Fentanyl 12.5 mcg q. 72 Ativan 0.5 twice a day Percocet 5/325 one to 2 tablets by mouth every 6 hours as needed for severe pain Vitamin C Vitamin D2 Prozac 10 mg bistolic 10 mg daily Seroquel 25 twice a day Lidocaine patch 5% to the left knee  Social; the patient comes back with a [place] most form. She is a DO NOT RESUSCITATE however she is to be transferred to hospital if necessary however to avoid intensive care. The patient does have a family. She has a daughter in [place] and a apparently an estranged daughter as well.  Family history; not available at this point  Review of systems; not possible in this patient as she is not verbal  Physical examination Gen. the patient lies in bed with her eyes open. I suspect she probably is more aware of her surroundings and is possible to really know with certainty Respiratory clear entry bilaterally Cardiac heart sounds are normal she appears to be euvolemic Abdomen; PICC site looks fine there is no tenderness no masses Extremities; she is contracted at the hips and knees these are very painful Skin; she has unstageable wounds on the medial noted  aspect of her left first metatarsal head and left heel, these are covered with a thick black eschar. Over her left buttock is a stage III wound and a small wound on the right buttock which is a stage II.  Mental status; she probably gripped my hand to command on one occasion. sHe on this she is not verbalizing will not follow directions. I am not certain whether this reflects  a dominant hemisphere problem, unresolved delirium, or possibly even concomitant severe depression.  Impression/plan #1; acute prerenal azotemia and hypernatremia now corrected. #2  severe protein calorie malnutrition now with a PEG tube. #3   complicated mental status even before this most recent decline. At baseline I think this woman had mild to moderate dementia but probably some form of psychiatric problem. She was persistent loud and aggressive with any form of care. She would not except her need to be in long-term care and never really accepted any form of rehabilitation. As mentioned above she was diagnosed with depression by psychiatry however I wondered whether there was more to this question bipolar. #4 now with a PEG tube feeding. I feel quite confident in stating that this patient would not have wanted this. At some point I may reach out to the [facility] social worker who authorized this. #5 unstageable wounds on the left foot, stage III wound on the left buttock. I am not going to treat these aggressively at this point. Hydrocolloid to the wounds on the foot. Santyl based the dressings with a foam covered with about. Positioning as much as possible off these wounds area #6 pain issues most of this now due to severe osteoarthritis flexion contractures. I believe that she was on the fentanyl patch before she went to the hospital I'll probably increase this. Using her oxycodone prior to wound care would also be reasonable.

## 2014-07-29 NOTE — Telephone Encounter (Signed)
[facility] 

## 2014-08-05 ENCOUNTER — Non-Acute Institutional Stay (SKILLED_NURSING_FACILITY): Payer: 59 | Admitting: Internal Medicine

## 2014-08-05 DIAGNOSIS — L89609 Pressure ulcer of unspecified heel, unspecified stage: Secondary | ICD-10-CM

## 2014-08-05 DIAGNOSIS — L89309 Pressure ulcer of unspecified buttock, unspecified stage: Secondary | ICD-10-CM

## 2014-08-05 NOTE — Progress Notes (Signed)
Patient ID: Lindsay Browning, female   DOB: 08-21-1919, 80 y.o.   MRN: [id number] [facility] Chief complaint; wound followup   This is the patient to I admitted to the facility after being admitted to hospital with prerenal azotemia. I actually cared for in a previous facility and largely she was comfortable measures. Through some mechanism this was changed and she now has a PEG tube in place for nutrition and liquids. It is suspected that she had had a stroke causing a decline in her speech don't think this was actually proven in the hospital. She came to us with wounds on her left first metatarsal head and left heel. Both of these were covered with thick black eschar. Over her left buttock is a stage III wound with a small wound on the right buttock it. We have been using simply phone covering on the wounds on her buttock and hydrocortisone her foot. I am reviewing these today.  On examination Skin; he is on her buttock especially on the left are clean with healthy granulation. The small area that is actually deeper also looks like it has healthy granulation there is really minimal necrosis and eschar at present the. This appears to be improved. On the left foot at the scar on the left heel appears to be liquefying somewhat the. The area over the left first metatarsal head still works as though there is suspected deep tissue injury here although there is no open area. I will continue to manage this expectantly  Impression/plan #1 pressure ulcers left greater than right buttock. These appear to be much more stable #2 areas on her left foot. I may attempt at debridement over the area on the calcaneus next week as the area appears to be liquefying and the patient is otherwise stable. The area over left first metatarsal head we will continue with a hydrocolloid until this area declares itself one way or the other. I am certainly not planning any debridement

## 2014-08-18 ENCOUNTER — Other Ambulatory Visit: Payer: Self-pay | Admitting: *Deleted

## 2014-08-18 MED ORDER — FENTANYL 25 MCG/HR TD PT72: 25.0000 ug | MEDICATED_PATCH | TRANSDERMAL | Status: DC

## 2014-08-18 NOTE — Telephone Encounter (Signed)
[facility] 

## 2014-08-19 ENCOUNTER — Non-Acute Institutional Stay (SKILLED_NURSING_FACILITY): Payer: 59 | Admitting: Internal Medicine

## 2014-08-19 DIAGNOSIS — S81802S Unspecified open wound, left lower leg, sequela: Secondary | ICD-10-CM

## 2014-08-19 DIAGNOSIS — L89323 Pressure ulcer of left buttock, stage 3: Secondary | ICD-10-CM

## 2014-08-20 ENCOUNTER — Other Ambulatory Visit: Payer: Self-pay | Admitting: *Deleted

## 2014-08-20 MED ORDER — OXYCODONE-ACETAMINOPHEN 5-325 MG PO TABS: ORAL_TABLET | ORAL | Status: DC

## 2014-08-20 NOTE — Telephone Encounter (Signed)
[facility] 

## 2014-08-25 NOTE — Progress Notes (Addendum)
Patient ID: Lindsay Browning, female   DOB: October 14, 1919, 80 y.o.   MRN: [id number]               PROGRESS NOTE  DATE:  08/19/2014    [facility]    LEVEL OF CARE:   SNF   Acute Visit   CHIEF COMPLAINT:  Review of medical issues.    HISTORY OF PRESENT ILLNESS:  I have been following [name], who is a lady whom I have cared for most of this year.  [facility] believe towards the end of 2014.   At that point, she had been in [facility].  She was found immobilized, covered with urine and feces as I remember this, in her home.    She came to the nursing home initially with widespread stage II ulcerations over her entire buttock.    She also had severe osteoarthritis of her hips and knees, significant PAD.  She never really cooperated enough with physical and occupational therapy to gain mobility, even in transferring.   She was largely non-cooperative with any form of care and really completely miserable.    She was followed by Psychiatry for a combination of depression, dementia, paranoia.  I always wondered whether she had background psychiatric problems that we are unaware of.    She was readmitted to hospital on 07/11/2014 through 07/24/2014 with dehydration with hypernatremia, new decubitus ulcers on her buttocks (these had previously healed at [facility]).  She was managed aggressively with a feeding tube.    To the staff, she is more awake and conversational.  This is likely to be resolving delirium.    LABORATORY DATA:  Since her return here, she has been found to have a TSH of 26.8 although this was probably related to not receiving her medications for a period of time.    She has also been noted to have an albumin of 3.2, which is quite an improvement from previous values.     Her hemoglobin is 8.9 with normochromic, normocytic indices.  I note an iron panel has been ordered.    Her stool guaiac is negative, tested by  myself today.    REVIEW OF SYSTEMS:  Not possible.    PHYSICAL EXAMINATION:   GENERAL APPEARANCE:  The patient is not in any distress.   CHEST/RESPIRATORY:  Clear air entry bilaterally.   CARDIOVASCULAR:  CARDIAC:   Heart sounds are normal.  She appears to be euvolemic.   GASTROINTESTINAL:  ABDOMEN:   No masses.  PEG site looks fine.   SKIN:  INSPECTION:  Buttock wounds are much improved with simply a foam dressing and pressure relief.  On the medial aspect of her left heel as well as medial aspect of her left first metatarsophalangeal joint are wounds.  I debrided both of these today with a #10 scalpel to remove eschar.  I think she probably has severe PAD.  I am doubtful there is enough blood flow here to support healing.  On the right foot, her small toe, #5, looks somewhat ischemic to me.   MUSCULOSKELETAL:   EXTREMITIES:  The patient is contractured, uncomfortable.  Additional narcotics may be required.    ASSESSMENT/PLAN:  Ulcerations on the left foot as described.  I think these are a combination of PAD and pressure.  I am not planning to do studies on this.  I do not think she is a candidate for interventional/surgical issues to revascularize.  She might be a candidate for amputation if this deteriorates over time, although I would recommend full Hospice-mediated care in lieu of that.    Anemia.  This is probably anemia of chronic malnutrition although looking back through her record, this has declined over the course of the year.  If she has had iron studies before, I do not see them.   Mental status issues.  I am not surprised this woman is improving.  I think she had a combination of dementia and depression with psychosis.  I always wondered whether she had underlying psychiatric issues (?bipolar or schizophrenia), although I could never get enough of that history to be certain.    I see that she has been seen by Palliative Care and a MOST form has been created.  This does not  really give us any specific limitations other than to avoid ICU.  She is a DNR.  However, before the inevitable next deterioration, I would like to sit down with the new DFS social worker and outline a plan of care here.  This woman is, and has been, completely miserable with her existence up until now.

## 2014-09-09 ENCOUNTER — Non-Acute Institutional Stay (SKILLED_NURSING_FACILITY): Payer: 59 | Admitting: Internal Medicine

## 2014-09-09 DIAGNOSIS — S81802S Unspecified open wound, left lower leg, sequela: Secondary | ICD-10-CM

## 2014-09-09 DIAGNOSIS — L89323 Pressure ulcer of left buttock, stage 3: Secondary | ICD-10-CM

## 2014-09-09 DIAGNOSIS — G894 Chronic pain syndrome: Secondary | ICD-10-CM

## 2014-09-11 NOTE — Progress Notes (Signed)
Patient ID: Lindsay Browning, female   DOB: 1919-03-15, 80 y.o.   MRN: [id number]               PROGRESS NOTE  DATE:  09/09/2014    [facility]AC[facility]    LEVEL OF CARE:   SNF   Routine Visit   CHIEF COMPLAINT:  Review of medical issues  HISTORY OF PRESENT ILLNESS:  This is a patient whom I have been following for most of this year.  She was [facility] admitted [facility] towards the end of 2014.  At that point, she had been living alone in [facility].  She was found immobilized in pain, covered with urine and feces.  I think most of her immobilization probably was severe osteoarthritis.  This has gradually declined.  She developed some contractures of her hips and knees.    She also came to the building with widespread stage 2 ulcerations.  However, with good wound care, these eventually resolved.    The other major set of issues were psychiatric including a combination of dementia, depression, paranoia.  I had always wondered about a background psychiatric condition here, either bipolar or some form of schizoaffective disorder, although I was never able to get enough ancillary history.    Throughout her course in the other nursing home, she was resistant to care in all forms.  She refused to work with PT and OT, even at a time where I felt it was possible that she might be able to gain some independence in transfers and short mobilization.    Towards the end of August or September, she started to decline.  Her oral intake declined.  She became dehydrated.  In my mind, in the care of the [employer] nurse practitioner, we were managing her in the facility as largely a comfort-mediated care.    She was ultimately transferred to hospital at the insistence of [facility].  There, she was found to be hypernatremic and severely malnourished.  None of this was particularly new or unsuspected.  She was rehydrated.  She had a PEG tube in place.    Her delirium has  cleared and now we are looking at underlying moderate to severe dementia, underlying paranoia/psychosis of some description.  Her mental status is returning basically to the baseline that we observed four months ago.     PHYSICAL EXAMINATION:   GENERAL APPEARANCE:  The patient is certainly more alert and conversational.  Just as angry and aggressive as she has been with me in the past although, according to staff, this varies.   CHEST/RESPIRATORY:  Clear air entry bilaterally.   CARDIOVASCULAR:  CARDIAC:  She appears to be euvolemic.    SKIN:  INSPECTION:  Her buttock wound is much improved with only a superficial wound remaining.  On the medial aspect of the left heel is still a necrotic area.  Her small toe has cleaned up nicely.  The area over her left first metatarsal head is also fully closed.    ASSESSMENT/PLAN:  Ulcerations on the left foot.  I think these are PAD and pressure.  Miraculously, these have healed in the face of continued PAD and some degree of pressure, although the facility does the best they can.  I am going to debride the left heel further in view of the healing we have already had.    Bilateral lower extremity flexion contractures.   This is still painful.    Anemia.  Probably of chronic disease.    Hypothyroidism.  Recently discovered to have a TSH of 31.945 on Synthroid 12.5.  This has been increased to 37.5 and appropriate follow-up has been ordered.    Protein calorie malnutrition with a PEG tube.  Her albumin is up to 3.2.             ADDENDUM:   We are going to meet with her DFS social worker and legal guardian.  We will try to discuss previous plans and plans for further care in this unfortunate woman.

## 2014-09-30 ENCOUNTER — Non-Acute Institutional Stay (SKILLED_NURSING_FACILITY): Payer: 59 | Admitting: Internal Medicine

## 2014-09-30 ENCOUNTER — Other Ambulatory Visit: Payer: Self-pay

## 2014-09-30 DIAGNOSIS — S91302D Unspecified open wound, left foot, subsequent encounter: Secondary | ICD-10-CM

## 2014-09-30 DIAGNOSIS — I739 Peripheral vascular disease, unspecified: Secondary | ICD-10-CM

## 2014-09-30 DIAGNOSIS — S81802S Unspecified open wound, left lower leg, sequela: Secondary | ICD-10-CM

## 2014-09-30 MED ORDER — FENTANYL 25 MCG/HR TD PT72: 25.0000 ug | MEDICATED_PATCH | TRANSDERMAL | Status: DC

## 2014-09-30 NOTE — Telephone Encounter (Signed)
Rx faxed to [facility] @ [phone], phone number [phone]  

## 2014-10-01 ENCOUNTER — Other Ambulatory Visit: Payer: Self-pay

## 2014-10-01 MED ORDER — OXYCODONE-ACETAMINOPHEN 5-325 MG PO TABS: ORAL_TABLET | ORAL | Status: DC

## 2014-10-01 NOTE — Telephone Encounter (Signed)
Rx faxed to [facility] @ [phone], phone number [phone]  

## 2014-10-06 NOTE — Progress Notes (Addendum)
Patient ID: Lindsay Browning, female   DOB: 16-Sep-1919, 80 y.o.   MRN: [id number]               PROGRESS NOTE  DATE:  09/30/2014    [facility]    LEVEL OF CARE:   SNF   Acute Visit   CHIEF COMPLAINT:  Review of left heel wound.    HISTORY OF PRESENT ILLNESS:  This is a patient who has severe dementia.    She was hospitalized in September due to dehydration and failure to thrive, and a PEG tube was placed.  With the nutrition and hydration, her mental status has improved.    Further, miraculously, her widespread decubitus ulcers on her buttocks have healed.  Her right fifth toe is also healed.  She has a large area on the medial aspect of the left foot that is still necrotic.  Her left toe is resolved.    Staff report that she has pain in her feet.  She has been on a longstanding Fentanyl patch.    REVIEW OF SYSTEMS:  Not possible.    PHYSICAL EXAMINATION:   GENERAL APPEARANCE:  The patient is more alert, more conversational; however, just as angry and aggressive as she has always been.   CHEST/RESPIRATORY:  Clear air entry bilaterally.   CARDIOVASCULAR:  CARDIAC:   Heart sounds are normal.  She appears to be euvolemic.   GASTROINTESTINAL:  LIVER/SPLEEN/KIDNEYS:  No liver, no spleen.   ABDOMEN:  No masses and no tenderness.   SKIN:  INSPECTION:  Her buttock wound has resolved.  Her left toe wound has resolved, also the wound over the left first metatarsal head.  The remaining area is on the left heel.  This is extremely malodorous today.  There is surrounding pain, liquefied eschar over the surface which is a greenish color (?Pseudomonas).   CIRCULATION:   ARTERIAL:  Her peripheral pulses are not palpable in her feet.    ASSESSMENT/PLAN:                      Remaining ulcer on the left foot.  I think this is infected.  I am going to start her empirically for coverage for Pseudomonas.  The wound was debrided of a large amount of liquefied material and then a deep  culture was done.  I think this wound probes down to bone in a small area.    Bilateral lower extremity flexion contractures and significant PAD.  I think both of these contribute to the pain here.  I would have no issues with increasing the Fentanyl.      I am going to start her on Levaquin while we await a C&S.  Change her dressing to silver alginate.    We did have a meeting with her DFS social workers earlier this month.  I have not heard back.  I will check with Amy Black, who is the [facility] practitioner.

## 2014-10-07 ENCOUNTER — Non-Acute Institutional Stay (SKILLED_NURSING_FACILITY): Payer: 59 | Admitting: Internal Medicine

## 2014-10-07 DIAGNOSIS — I739 Peripheral vascular disease, unspecified: Secondary | ICD-10-CM

## 2014-10-07 DIAGNOSIS — M86672 Other chronic osteomyelitis, left ankle and foot: Secondary | ICD-10-CM

## 2014-10-07 DIAGNOSIS — S91302D Unspecified open wound, left foot, subsequent encounter: Secondary | ICD-10-CM

## 2014-10-08 NOTE — Progress Notes (Addendum)
Patient ID: Lindsay Browning, female   DOB: 1919-02-16, 80 y.o.   MRN: [id number]               PROGRESS NOTE  DATE:  10/07/2014        [facility]    LEVEL OF CARE:   SNF   Acute Visit   CHIEF COMPLAINT:  Review of wound on her medial left calcaneus.    HISTORY OF PRESENT ILLNESS:  I saw this area last week.  It underwent a deep surgical debridement and a culture was done.  I do not have the culture at this point, although I see that the Levaquin I ordered last week has been switched to Bactrim.  An x-ray of the heel is concerning for early osteomyelitis.  Consider an MRI, CT, or a three-stage bone scan.    LABORATORY DATA:  I did order lab work on her including a CBC, differential, sedimentation rate, and a C-reactive protein.  I do not have these results, either.    PHYSICAL EXAMINATION:   GENERAL APPEARANCE:  The patient is more alert, conversational; however, still has moderate to severe dementia.   CHEST/RESPIRATORY:  Clear air entry bilaterally.   CARDIOVASCULAR:  CARDIAC:   Heart sounds are normal.  She appears to be euvolemic.   SKIN:  INSPECTION:  The remaining issue is on the left medial heel.  However, this is a significant wound.  Once again, I anesthetized this with topical lidocaine, did a deep surgical debridement with curette, removed surface slough and necrotic subcutaneous tissue.  I think this probes to bone, most likely.   CIRCULATION/VASCULAR:    ARTERIAL:  Her peripheral pulses are not palpable in her feet.  I will see if I can make reference to vascular studies from my notes on her.    ASSESSMENT/PLAN:                                     Ulcer on her left foot.  This probes to bone.  I am not surprised that the x-ray suggested early osteomyelitis.  I do not think she is a candidate for an MRI, CT scan, and especially not a bone scan.  I think she should be treated empirically as if this was osteomyelitis.  I note that her sed rate is 105.  Her  C-reactive protein is not elevated for reasons that are not really clear.  In any case, she is allergic to penicillin, but [facility] shows that she has received [place] in the past.   Therefore, she is not allergic to cephalosporins.  I am going to start her on Rocephin intramuscularly for at least two weeks before attempting anything orally.  I will follow the wound clinically and again with a sedimentation rate.    Significant PAD.  Once again, I think it is going to be difficult to get arterial studies on this woman due to the pain issues, her dementia issues, etc.  I will see if I can reference these during the stay at the other facility as they may have been done.      Another surgical debridement was done with a currette and forceps. This removed ne   CPT CODE: [id number]

## 2014-10-14 ENCOUNTER — Non-Acute Institutional Stay (SKILLED_NURSING_FACILITY): Payer: 59 | Admitting: Internal Medicine

## 2014-10-14 DIAGNOSIS — M86672 Other chronic osteomyelitis, left ankle and foot: Secondary | ICD-10-CM

## 2014-10-14 DIAGNOSIS — S91302D Unspecified open wound, left foot, subsequent encounter: Secondary | ICD-10-CM

## 2014-10-17 NOTE — Progress Notes (Addendum)
Patient ID: Lindsay Browning, female   DOB: August 24, 1919, 80 y.o.   MRN: [id number]               PROGRESS NOTE  DATE:  10/14/2014     [facility]    LEVEL OF CARE:   SNF   Acute Visit   CHIEF COMPLAINT:  Review of left heel wound.    HISTORY OF PRESENT ILLNESS:  This is a patient whom I saw last week and she underwent a second deep surgical debridement of a left heel ulcer.  Previous x-ray had suggested osteomyelitis.  A deep culture of this area showed Proteus.  Although she is penicillin allergic, review of records suggested that she had previously received third-generation cephalosporins.  Therefore, I have her on ceftriaxone.    I think she also has significant PAD.  Although I am not really certain that we could do arterial studies in the building, I still want to see if I had done those previously at the previous facility where she was, where I was her doctor.   Currently, we are using silver alginate to the heel.     PHYSICAL EXAMINATION:   SKIN:  INSPECTION:  Left heel:  The wound is still deep but has signs of healthy granulation, much less slough.  There is no debridement.  There is less pain along the wound area.  I cannot see any exposed bone, which was there previously.    ASSESSMENT/PLAN:                   Left heel ulcer with complicating underlying osteomyelitis, with culture showing Proteus.  I am going to continue the Rocephin for another week and then change her to oral Keflex for another four weeks.  Lab work is going to be followed up next month.    Significant PAD.  Once again, I will see if I can find previous studies.  She is not going to be a good candidate for an attempt at any form of re-vascularization.  Depending on what her legal guardian wishes to do, at some point I think this lady is probably going to develop either a nonhealing wound with severe pain and/or an underlying infection that will require amputation.

## 2014-10-28 ENCOUNTER — Non-Acute Institutional Stay (SKILLED_NURSING_FACILITY): Payer: 59 | Admitting: Internal Medicine

## 2014-10-28 DIAGNOSIS — I739 Peripheral vascular disease, unspecified: Secondary | ICD-10-CM

## 2014-10-28 DIAGNOSIS — S91302D Unspecified open wound, left foot, subsequent encounter: Secondary | ICD-10-CM

## 2014-10-30 ENCOUNTER — Other Ambulatory Visit: Payer: Self-pay

## 2014-10-30 MED ORDER — FENTANYL 25 MCG/HR TD PT72: 25.0000 ug | MEDICATED_PATCH | TRANSDERMAL | Status: DC

## 2014-10-30 NOTE — Telephone Encounter (Signed)
Rx faxed to [facility] @ [phone], phone number [phone]  

## 2014-11-03 NOTE — Progress Notes (Addendum)
Patient ID: Lindsay Browning, female   DOB: [birth year], 80 y.o.   MRN: [id number]               PROGRESS NOTE  DATE:  10/28/2014       [facility]    LEVEL OF CARE:   SNF   Acute Visit   CHIEF COMPLAINT:  Review of left heel wound.    HISTORY OF PRESENT ILLNESS:  This is a patient whom I saw earlier this month with a large wound on the medial aspect of her left heel.  Previous x-ray had suggested underlying osteomyelitis.  She has a history of PAD.  I put her on Rocephin for two weeks.  I then changed her to Keflex 500 mg q.8 for a further four weeks.  I have not confirmed the osteomyelitis with an MRI as I do not think we could get this patient physically and logistically into the machine.    PHYSICAL EXAMINATION:   SKIN:  INSPECTION:  Left heel:  The wound continues to be improved.  There is less peri-wound tenderness.  I do not think debridement is necessary.  There is a slight surface slough that I may curette in the next week or two.   There is healthy granulation here.    ASSESSMENT/PLAN:                      Left heel ulcer with complicating underlying osteomyelitis.  Culture showing Proteus.  She is on cephalosporins as prescribed.  Lab work was followed up, although I do not see these results.    Significant PAD.  I could not find the previous studies from her stay in the previous facility.  However, she is not a candidate for any attempt at revascularization.  Depending on what the future brings, hopefully we can spare her an amputation.  However, currently this all seems to be better.

## 2014-11-18 ENCOUNTER — Non-Acute Institutional Stay (SKILLED_NURSING_FACILITY): Payer: Medicare Other | Admitting: Internal Medicine

## 2014-11-18 DIAGNOSIS — I739 Peripheral vascular disease, unspecified: Secondary | ICD-10-CM

## 2014-11-18 DIAGNOSIS — S91302D Unspecified open wound, left foot, subsequent encounter: Secondary | ICD-10-CM

## 2014-11-18 DIAGNOSIS — G894 Chronic pain syndrome: Secondary | ICD-10-CM

## 2014-11-18 DIAGNOSIS — M86672 Other chronic osteomyelitis, left ankle and foot: Secondary | ICD-10-CM

## 2014-11-20 NOTE — Progress Notes (Addendum)
Patient ID: Lindsay Browning, female   DOB: [birth year], 81 y.o.   MRN: [id number]               PROGRESS NOTE  DATE:  11/18/2014                [facility]     LEVEL OF CARE:   SNF   Acute Visit   CHIEF COMPLAINT:  Follow up left heel wound, other medical issues.    HISTORY OF PRESENT ILLNESS:  This is a patient whom I have been following for a large necrotic wound on her left heel with likely underlying osteomyelitis.  She has a history of PAD.  She received two weeks of Rocephin and Keflex for a further four weeks.  Deep cultures of draining purulent material from this wound grew Proteus.  Her underlying sedimentation rate was 105.  She has now completed her antibiotics and, with good wound care, this area on her calcaneus is actually completely healed.   This is indeed surprising.    PHYSICAL EXAMINATION:   SKIN:  INSPECTION:   Left heel:  Complete resolution here.  Unfortunately, she has bilateral severe flexion contractures that are painful.   CARDIOVASCULAR:  CARDIAC:   Heart sounds are normal.  She appears to be euvolemic.   CHEST/RESPIRATORY:  Clear air entry bilaterally.    ASSESSMENT/PLAN:                Left heel ulcer with complicating underlying osteomyelitis and PAD.  Miraculously, this has completely healed.  Of course, complete resolution of the underlying osteomyelitis is more difficult to prove.  However, given everything else in this patient, I am not going to go ahead and do any further imaging or lab studies.  This will be clinical follow-up only.    PAD.  Once again, I am not going to screen her with noninvasive studies.     Severe bilateral flexion contractures.  She has Fentanyl patch 25 mg every hour q.72.  This is actually satisfactory for now.  Her discomfort may get worse.    Continued PEG tube dependency.  She has not been taking anything orally.  This is mostly due to progressive dementia. She had a rapid progression of her cognitive  impairment in August 2015, she has never completely recovered

## 2014-12-01 ENCOUNTER — Other Ambulatory Visit: Payer: Self-pay | Admitting: *Deleted

## 2014-12-01 MED ORDER — FENTANYL 25 MCG/HR TD PT72: 25.0000 ug | MEDICATED_PATCH | TRANSDERMAL | Status: DC

## 2014-12-01 NOTE — Telephone Encounter (Signed)
[facility] 

## 2014-12-09 ENCOUNTER — Non-Acute Institutional Stay (SKILLED_NURSING_FACILITY): Payer: Medicare Other | Admitting: Internal Medicine

## 2014-12-09 DIAGNOSIS — M86672 Other chronic osteomyelitis, left ankle and foot: Secondary | ICD-10-CM

## 2014-12-09 DIAGNOSIS — M1612 Unilateral primary osteoarthritis, left hip: Secondary | ICD-10-CM

## 2014-12-12 NOTE — Progress Notes (Addendum)
Patient ID: Lindsay Browning, female   DOB: 1919/08/25, 81 y.o.   MRN: [id number]              PROGRESS NOTE  DATE:  12/09/2014                    [facility]                   LEVEL OF CARE:   SNF   Routine Visit   CHIEF COMPLAINT:  Routine visit to follow medical issues/[employer] visit.      HISTORY OF PRESENT ILLNESS:  This is a patient with progressive dementia.    She actually had been admitted to my care at another building, I believe in the fall of 2014.  At that point, she was a lot more functional cognitively.  However, she had severe bilateral osteoarthritis.  She was continuously combative, at least verbally, with anybody who tried to help her.  Therefore, she never really progressed in therapy.  She became bedbound, flexion contractured, etc.    She also had problems when she initially came to our care with severe buttock ulcers.  However, these actually healed in the other facility.     Towards the end of the summer in 2015, she progressively declined, became dehydrated and malnourished.  We were managing her as if she was comfort care in the facility.  There was intervention with her DFS social worker that was unexpected.  She was hospitalized, PEG tubed, and she has remained in this facility since then.    The major issue here had been a left heel ulcer with underlying osteomyelitis.  We gave her antibiotics and wound care here, and both the left heel ulcer and buttocks ulcers have closed and she has no open areas currently.    PAST MEDICAL HISTORY/PROBLEM LIST:                  Decubitus ulcers as discussed above.    Left heel ulcer with underlying osteomyelitis.  Currently closed.    Protein calorie malnutrition.  Now with a PEG tube.    Dysphagia secondary to dementia.   Now with a PEG tube.    Hypothyroidism.    Dementia.    Severe lower extremity osteoarthritis.  Now with flexion contractures.    PHYSICAL EXAMINATION:            VITAL SIGNS:     TEMPERATURE:  98.   PULSE:   68.   RESPIRATIONS:  20.   BLOOD PRESSURE:  120/65.   O2 SATURATIONS:  95% on room air.   CHEST/RESPIRATORY:  Clear air entry bilaterally.     CARDIOVASCULAR:  CARDIAC:   Heart sounds are normal.  A soft systolic murmur is noted.   ARTERIAL:  She probably has PAD, although I am not planning to work this up aggressively.      ASSESSMENT/PLAN:                  Advanced dementia.  Now PEG tube dependent.    Osteomyelitis of the left heel with an overlying ulcer that has miraculously healed.  No ulcers are open at present.    Hypertensive-induced chronic renal insufficiency.  Last BUN and creatinine were 64 and 1.31, respectively.    Pain issues due to severe osteoarthritis and flexion contractures.  She may also have PAD.  I am hopeful that she will not develop ischemic problems in her feet.

## 2015-01-05 ENCOUNTER — Other Ambulatory Visit: Payer: Self-pay | Admitting: *Deleted

## 2015-01-05 MED ORDER — FENTANYL 25 MCG/HR TD PT72: MEDICATED_PATCH | TRANSDERMAL | Status: DC

## 2015-01-05 NOTE — Telephone Encounter (Signed)
[facility] # [phone] Fax: [phone] 

## 2015-01-13 ENCOUNTER — Non-Acute Institutional Stay (SKILLED_NURSING_FACILITY): Payer: Medicare Other | Admitting: Internal Medicine

## 2015-01-13 DIAGNOSIS — G894 Chronic pain syndrome: Secondary | ICD-10-CM | POA: Diagnosis not present

## 2015-01-13 DIAGNOSIS — G309 Alzheimer's disease, unspecified: Secondary | ICD-10-CM | POA: Diagnosis not present

## 2015-01-13 DIAGNOSIS — M86672 Other chronic osteomyelitis, left ankle and foot: Secondary | ICD-10-CM | POA: Diagnosis not present

## 2015-01-13 DIAGNOSIS — F0280 Dementia in other diseases classified elsewhere without behavioral disturbance: Secondary | ICD-10-CM

## 2015-01-17 NOTE — Progress Notes (Addendum)
Patient ID: Lindsay Browning, female   DOB: [birth year], 81 y.o.   MRN: [id number]                PROGRESS NOTE  DATE:  01/13/2015              [facility]AC[facility]                                  LEVEL OF CARE:   SNF   Routine Visit                                CHIEF COMPLAINT:  Routine visit to follow medical issues/[employer] visit.        HISTORY OF PRESENT ILLNESS:  This is a patient with progressive dementia who eventually entered a failure to thrive syndrome  late last summer of 2015.  At that point, her POA with the Division of Facility Services elected to put a PEG tube in the patient.    When she arrived here, she had bilateral buttock ulcers and also a left heel ulcer with underlying osteomyelitis.  We gave her antibiotics and wound care, and the left heel ulcer and both buttock ulcers closed and have remained so since.    She has not had any major issues.  She continues on tube feeding.  Has had some weight loss.    PAST MEDICAL HISTORY/PROBLEM LIST:                         Decubitus ulcers as discussed.    Left heel ulcer with underlying osteomyelitis.  Currently closed, and there has not been any reopening.    Protein calorie malnutrition secondary to failure to thrive and advanced pre-terminal dementia.  Now with a PEG tube.    Dysphagia secondary to dementia.  Now with a PEG tube.    Hypothyroidism.     Severe lower extremity osteoarthritis with flexion contractures and chronic pain syndrome.    CURRENT MEDICATIONS:  Medication list is reviewed.                          Prozac 10 q.d.       Bystolic 10 mg daily.    Lidoderm 5% patch onto the left knee daily.      Ferrous sulfate 325 daily.     Percocet 5/325, 1 tablet at 9 a.m.      Vitamin D2, 50,000 U every 30 days.      Synthroid 50 q.d.        Fentanyl patch 25 q.72.    Omeprazole 20 q.d.       Valproic acid 250 twice a day as a mood stabilizer.    Fibersource HN 65 mL/hr.       REVIEW  OF SYSTEMS:   Not able to give a review of systems due to mental status.    PHYSICAL EXAMINATION:   GENERAL APPEARANCE:  The patient is not in any distress.    CHEST/RESPIRATORY:  Exam is clear.        CARDIOVASCULAR:   CARDIAC:  She appears to be euvolemic.   GASTROINTESTINAL:   ABDOMEN:  Nontender.  There are no masses.    MUSCULOSKELETAL:   EXTREMITIES:  Contractured.   NEUROLOGICAL:   Essentially unchanged. The patient was  nonverbal with me today.      ASSESSMENT/PLAN:             Advanced dementia.  Now with a PEG tube.    Left heel osteomyelitis.  Miraculously, this healed with IV antibiotics and wound care, although she is at risk for reopening.    There are no other major issues at present that I am aware of.

## 2015-02-04 ENCOUNTER — Other Ambulatory Visit: Payer: Self-pay

## 2015-02-04 MED ORDER — OXYCODONE-ACETAMINOPHEN 5-325 MG PO TABS: ORAL_TABLET | ORAL | Status: DC

## 2015-02-04 NOTE — Telephone Encounter (Signed)
Rx faxed to [facility] @ [phone], phone number [phone]  

## 2015-02-09 ENCOUNTER — Other Ambulatory Visit: Payer: Self-pay | Admitting: *Deleted

## 2015-02-09 MED ORDER — FENTANYL 25 MCG/HR TD PT72: MEDICATED_PATCH | TRANSDERMAL | Status: DC

## 2015-02-09 NOTE — Telephone Encounter (Signed)
[facility] 

## 2015-02-17 ENCOUNTER — Non-Acute Institutional Stay (SKILLED_NURSING_FACILITY): Payer: Medicare Other | Admitting: Internal Medicine

## 2015-02-17 DIAGNOSIS — M1612 Unilateral primary osteoarthritis, left hip: Secondary | ICD-10-CM | POA: Diagnosis not present

## 2015-02-17 DIAGNOSIS — M86672 Other chronic osteomyelitis, left ankle and foot: Secondary | ICD-10-CM

## 2015-02-17 DIAGNOSIS — F0280 Dementia in other diseases classified elsewhere without behavioral disturbance: Secondary | ICD-10-CM

## 2015-02-17 DIAGNOSIS — G309 Alzheimer's disease, unspecified: Secondary | ICD-10-CM

## 2015-02-22 NOTE — Progress Notes (Addendum)
Patient ID: Lindsay Browning, female   DOB: 12-26-1918, 81 y.o.   MRN: [id number]                PROGRESS NOTE  DATE:  02/17/2015             FACILITY: [facility]                        LEVEL OF CARE:   SNF   Routine Visit                            CHIEF COMPLAINT:  Routine visit to follow medical issues/[employer] visit.      HISTORY OF PRESENT ILLNESS:  This is a patient with progressive dementia who eventually entered a failure to thrive syndrome in late summer of 2015.  She has initially presented to that facility perhaps 10 months before with dementia, multiple ulcerations, and found down in her own home in [facility].  It had been determined in the facility that she was comfort care.  However, she transitioned to a Social Work DFS guardian who insisted on a feeding tube.  She came to this facility after that.  Miraculously, her heel ulcers and buttock ulcers have closed.  She did have underlying osteomyelitis in the left heel.  Mercifully, that has not reopened.  She continues on the PEG tube feeding.    PAST MEDICAL HISTORY/PROBLEM LIST:          Decubitus ulcers, as described.  Currently closed.    Left heel ulcer with underlying osteomyelitis.  Currently closed and there has not been any reopening.    Protein calorie malnutrition.  Secondary to advanced dementia and failure to thrive.  Now with a PEG tube.    Dysphagia.  Secondary to dementia.    Hypothyroidism.    Severe lower extremity osteoarthritis with flexion contractures with resultant chronic pain syndrome.    Depression.      CURRENT MEDICATIONS:  Medication list is reviewed.              Prozac 10 mg q.d.      Bystolic 10 mg daily.    Lidoderm patch to left knee daily.    Ferrous sulfate 325 daily.    Percocet 5/325 q.4 p.r.n.      Vitamin D2, 50,000 U monthly.      Synthroid 50 monthly.    Duragesic 25 mcg/hr patch.    Vitamin C 500 U twice daily.    Pro-Stat 30 b.i.d.     LABORATORY DATA:    Last lab work from 01/26/2015:     TSH 6.4.    CBC:   White count 7, hemoglobin 9.3.    Liver panel was normal except for an albumin of 2.7.    Valproic acid level was 38.7.     Pre-albumin just below the reference range of 16.5.     PHYSICAL EXAMINATION:   VITAL SIGNS:   TEMPERATURE:  She is afebrile.     O2 SATURATIONS:  98% on room air.   RESPIRATIONS:    18.   PULSE:     96.   BLOOD PRESSURE:  120/84.     WEIGHT:   199 pounds.     CARDIOVASCULAR:   CARDIAC:  Heart sounds are normal.  She appears to be euvolemic.        GASTROINTESTINAL:   ABDOMEN:  PEG site is stable.  There are no masses.   NEUROLOGICAL:   Essentially unchanged.  The patient is not verbal, although she can speak as I understand from the staff.    ASSESSMENT/PLAN:                          Advanced dementia.  Now with a PEG tube.    Left heel osteomyelitis.  No evidence that this has recurred.    Osteoarthritis.  This has been a major issue for this patient.  At the time I first met her, this was the reason that she could no longer ambulate.   I agree with current measures.

## 2015-03-10 ENCOUNTER — Other Ambulatory Visit: Payer: Self-pay | Admitting: *Deleted

## 2015-03-10 MED ORDER — FENTANYL 25 MCG/HR TD PT72: MEDICATED_PATCH | TRANSDERMAL | Status: DC

## 2015-03-10 NOTE — Telephone Encounter (Signed)
[facility] 

## 2015-03-11 ENCOUNTER — Other Ambulatory Visit: Payer: Self-pay | Admitting: *Deleted

## 2015-03-11 MED ORDER — FENTANYL 12 MCG/HR TD PT72: 12.5000 ug | MEDICATED_PATCH | TRANSDERMAL | Status: DC

## 2015-03-17 ENCOUNTER — Non-Acute Institutional Stay (SKILLED_NURSING_FACILITY): Payer: Medicare Other | Admitting: Internal Medicine

## 2015-03-17 DIAGNOSIS — F0280 Dementia in other diseases classified elsewhere without behavioral disturbance: Secondary | ICD-10-CM

## 2015-03-17 DIAGNOSIS — G309 Alzheimer's disease, unspecified: Secondary | ICD-10-CM

## 2015-03-17 DIAGNOSIS — M86672 Other chronic osteomyelitis, left ankle and foot: Secondary | ICD-10-CM | POA: Diagnosis not present

## 2015-03-17 DIAGNOSIS — M2450 Contracture, unspecified joint: Secondary | ICD-10-CM | POA: Diagnosis not present

## 2015-03-17 DIAGNOSIS — M1612 Unilateral primary osteoarthritis, left hip: Secondary | ICD-10-CM | POA: Diagnosis not present

## 2015-03-22 NOTE — Progress Notes (Addendum)
Patient ID: Lindsay Browning, female   DOB: 1919-01-14, 81 y.o.   MRN: [id number]                PROGRESS NOTE  DATE:  03/17/2015          [facility]AC[facility]                 LEVEL OF CARE:   SNF   Routine Visit                 CHIEF COMPLAINT:  Routine visit to follow medical issues/[employer] visit.      HISTORY OF PRESENT ILLNESS:  This is a patient with really severe dementia who entered a failure to thrive state in the late summer of 2015.  She had been in that facility for roughly 10 months before with dementia, multiple pressure ulcers, and was found down [place] [facility] [place].  It had been determined in the facility that she was comfort care.  However, at some point she transitioned to a Child psychotherapistsocial worker DFS guardian who insisted on a feeding tube.  She came to this facility after that.    Miraculously, her heel ulcers and buttock ulcers have closed.  She did have underlying osteomyelitis of the left heel.  Mercifully, this has not reopened.  She continues on PEG tube feeding.     She is felt to have depression and I certainly agree with that.  She is currently on Depakote and Prozac.  I think the Prozac was started at a time before the feeding tube because of its long half-life.  It could be changed to something else if need be at this point.    There have been no other concerns.    PAST MEDICAL HISTORY/PROBLEM LIST:        Decubitus ulcers, as described above.  Currently closed.    Left heel ulcer with underlying osteomyelitis.  Currently closed.    Protein calorie malnutrition secondary to advanced dementia and failure to thrive.  Now with a PEG tube, which has stabilized her.    Dysphagia secondary to dementia.    Hypothyroidism.    Severe lower extremity flexion contractures with resultant chronic pain syndrome.    Severe bilateral osteoarthritis.       CURRENT MEDICATIONS:  Medication list is reviewed.                        Prozac 10 mg daily.     Multivitamin daily.    Bystolic 10 mg daily.     Lidoderm patch to the left knee daily.    Ferrous sulfate 325 daily.     Vitamin D2, 50,000 U daily.      Synthroid 125 daily.     Pro-Stat 30 mL per tube daily.    Percocet 5/325, 1 tablet daily.     Fentanyl 25 mcg per hour patch q.72.     Vitamin C 500 b.i.d.      Nexium 24-hour twice daily.        Valproic acid 250 per 5 mL, 250 b.i.d.       PHYSICAL EXAMINATION:   GENERAL APPEARANCE:  The patient is lying in bed.  She will open her eyes and try to mumble a word, but not much comprehensible.   CHEST/RESPIRATORY:  Clear air entry bilaterally.    CARDIOVASCULAR:   CARDIAC:  Heart sounds are normal.  She appears to be euvolemic.  GASTROINTESTINAL:   ABDOMEN:  PEG site is stable.  There are no masses.      ASSESSMENT/PLAN:                          Advanced dementia with all of its complications.  Now with a PEG tube.     Left heel osteomyelitis.  Mercifully, this has remained closed.    Osteoarthritis.  At one point, this was a major issue for this patient.  She now has flexion contractures and chronic pain. She is on narcotics for this. She has developed flexion contractures which add to the problem

## 2015-04-09 ENCOUNTER — Other Ambulatory Visit: Payer: Self-pay | Admitting: *Deleted

## 2015-04-09 MED ORDER — FENTANYL 25 MCG/HR TD PT72: MEDICATED_PATCH | TRANSDERMAL | Status: DC

## 2015-04-09 NOTE — Telephone Encounter (Signed)
[facility] 

## 2015-04-28 ENCOUNTER — Non-Acute Institutional Stay (SKILLED_NURSING_FACILITY): Payer: Medicare Other | Admitting: Internal Medicine

## 2015-04-28 DIAGNOSIS — I739 Peripheral vascular disease, unspecified: Secondary | ICD-10-CM | POA: Diagnosis not present

## 2015-04-28 DIAGNOSIS — S91301A Unspecified open wound, right foot, initial encounter: Secondary | ICD-10-CM | POA: Diagnosis not present

## 2015-04-30 ENCOUNTER — Other Ambulatory Visit: Payer: Self-pay | Admitting: *Deleted

## 2015-04-30 MED ORDER — OXYCODONE-ACETAMINOPHEN 5-325 MG PO TABS: ORAL_TABLET | ORAL | Status: DC

## 2015-04-30 NOTE — Telephone Encounter (Signed)
[facility] 

## 2015-05-03 NOTE — Progress Notes (Addendum)
Patient ID: Lindsay Browning, female   DOB: [birth year], 81 y.o.   MRN: [id number]                PROGRESS NOTE  DATE:  04/28/2015            FAC[facility]: [facility]                              LEVEL OF CARE:   SNF   Acute Visit                     CHIEF COMPLAINT:  Wounds on the right foot.    HISTORY OF PRESENT ILLNESS:  [name] is an unfortunate woman with advanced dementia, multiple lower extremity flexion contractures, who is PEG tube dependent.    Through most of late 2015, we dealt with wounds on her left foot, specifically her heel with underlying osteomyelitis.  She has known PAD but, given her status, I do not think she was a candidate for any work-up of this.  With good wound care and IV antibiotics, the wounds on her left foot eventually healed and, through most of this year so far, we have not had any issues.    Unfortunately, the wound care nurse today has shown me two wounds, this time on the right foot on the lateral aspect of the right first metatarsophalangeal joint and on the lateral aspect of her right fifth toe.    PHYSICAL EXAMINATION:   VITAL SIGNS:     TEMPERATURE:   PULSE:   RESPIRATIONS:   BLOOD PRESSURE:   02 SATURATIONS:   HEIGHT:   WEIGHT:   GENERAL APPEARANCE:   The patient is not in any overt medical distress.   VASCULAR:   ARTERIAL:  Her peripheral pulses are not palpable below the femoral.   WOUND REVIEW:  The areas on her right foot include a dime-sized wound on the lateral aspect of the right first metatarsal head.  This has about 50% surface eschar, which will need Santyl and possible debridement.  The more worrisome area is on the lateral aspect of the right fifth toe.  Although this is a small wound, there is discoloration here and I think some significant pain.  This may represent infection or ischemia.  I am just not sure which at this point, although I think that antibiotics are certainly indicated.    ASSESSMENT/PLAN:             Probable ischemic wounds, as described.  The one on the right lateral first metatarsal head does not look too bad.  Santyl-based dressings here.  We will use silver collagen to the right fifth toe, and I am going to put her on empiric doxycycline.  For now, no additional studies.  She certainly would not be a candidate for anything but an amputation, and even then I would have trouble trying to put this patient through this without an extensive ethical discussion.  I will follow these carefully, a

## 2015-05-15 ENCOUNTER — Other Ambulatory Visit: Payer: Self-pay | Admitting: *Deleted

## 2015-05-15 MED ORDER — FENTANYL 25 MCG/HR TD PT72: MEDICATED_PATCH | TRANSDERMAL | Status: DC

## 2015-05-19 ENCOUNTER — Non-Acute Institutional Stay (SKILLED_NURSING_FACILITY): Payer: Medicare Other | Admitting: Internal Medicine

## 2015-05-19 DIAGNOSIS — S91301A Unspecified open wound, right foot, initial encounter: Secondary | ICD-10-CM

## 2015-05-19 NOTE — Progress Notes (Signed)
Patient ID: Lindsay Browning, female   DOB: 1919-03-05, 81 y.o.   MRN: [id number]                PROGRESS NOTE  DATE:  05/19/2015            FACILITY: [facility]                              LEVEL OF CARE:   SNF   Acute Visit                     CHIEF COMPLAINT:  Wounds on the right foot.    HISTORY OF PRESENT ILLNESS:  Lindsay Browning is an unfortunate woman with advanced dementia, multiple lower extremity flexion contractures, who is PEG tube dependent.    Through most of late 2015, we dealt with wounds on her left foot, specifically her heel with underlying osteomyelitis.  She has known PAD but, given her status, I do not think she was a candidate for any work-up of this.  With good wound care and IV antibiotics, the wounds on her left foot eventually healed and, through most of this year so far, we have not had any issues.    She was Seen 2-3 weeks ago with wounds on her rightt first metatarsal head on the medial aspect and on the lateral aspect of the rightt fifth toe. Both of these had adherent escar. We have been using santal.      PHYSICAL EXAMINATION:   VITAL SIGNS:     TEMPERATURE:   PULSE:   RESPIRATIONS:   BLOOD PRESSURE:   02 SATURATIONS:   HEIGHT:   WEIGHT:   GENERAL APPEARANCE:   The patient is not in any overt medical distress.   VASCULAR:   ARTERIAL:  Her peripheral pulses are not palpable below the femoral.   WOUND REVIEW:  The areas on her right foot include a dime-sized wound on the lateral aspect of the right first metatarsal head.  This has about 50% surface eschar, which will need Santyl and possible debridement.  The more worrisome area is on the lateral aspect of the right fifth toe.  Although this is a small wound, there is discoloration here and I think some significant pain.  This may represent infection or ischemia.  I am just not sure which at this point, although I think that antibiotics are certainly indicated.    ASSESSMENT/PLAN:             Probable ischemic wounds, as described.  The one on the right lateral first metatarsal head does not look too bad.  Santyl-based dressings here.  We used collagen on the 5th toe. Both of these wounds are completely improved. The first met head still has escar. But most of the base appears healthy. Collagen next week is possible. The 5th toe wound has closed. No additional studies ordered.

## 2015-05-30 ENCOUNTER — Non-Acute Institutional Stay (SKILLED_NURSING_FACILITY): Payer: Medicare Other | Admitting: Internal Medicine

## 2015-05-30 DIAGNOSIS — F0280 Dementia in other diseases classified elsewhere without behavioral disturbance: Secondary | ICD-10-CM

## 2015-05-30 DIAGNOSIS — S91301S Unspecified open wound, right foot, sequela: Secondary | ICD-10-CM

## 2015-05-30 DIAGNOSIS — M2450 Contracture, unspecified joint: Secondary | ICD-10-CM

## 2015-05-30 DIAGNOSIS — I739 Peripheral vascular disease, unspecified: Secondary | ICD-10-CM | POA: Diagnosis not present

## 2015-05-30 DIAGNOSIS — G309 Alzheimer's disease, unspecified: Secondary | ICD-10-CM

## 2015-05-31 IMAGING — CT CT ABDOMEN W/O CM
2 of 4 series · 17 of 46 positions shown, 19 images · non-contrast
Comparison: None.

CLINICAL DATA: Preop for G-tube placement. Evaluate position of
stomach.

EXAM:
CT ABDOMEN WITHOUT CONTRAST
TECHNIQUE: Multidetector CT imaging of the abdomen was performed following the
standard protocol without IV contrast.

[Series 2: rtn a/p w/o · axial · non-contrast · 0.74mm/px · z∈[-231,-1]mm · 14 of 52 slices shown, 16 images]
[im 3/52  soft-tissue]
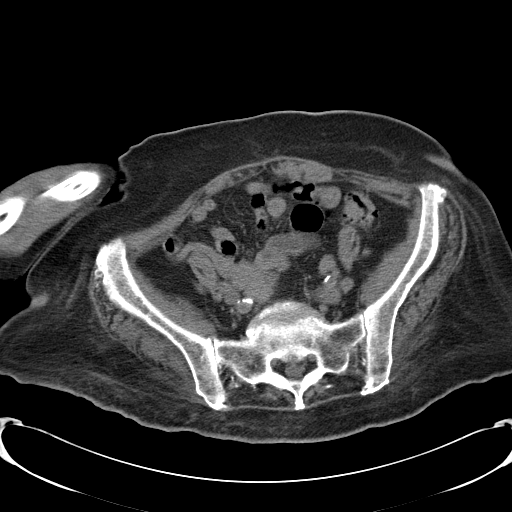
[im 3/52  bone]
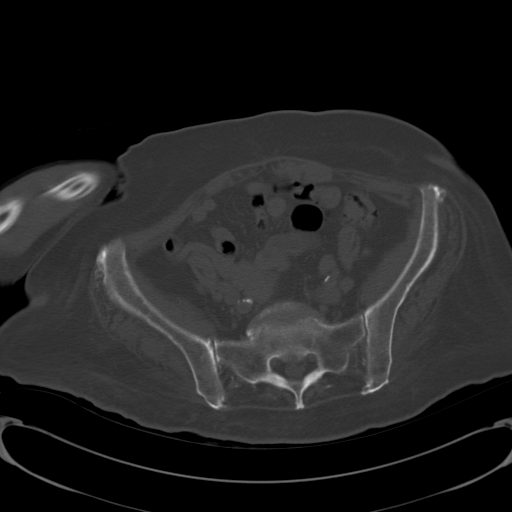
[im 7/52  soft-tissue]
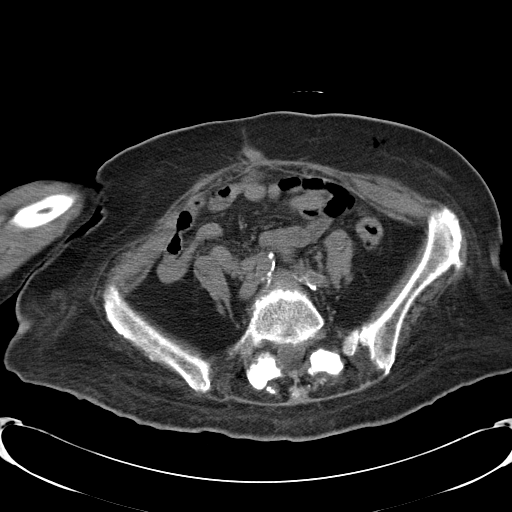
[im 11/52  soft-tissue]
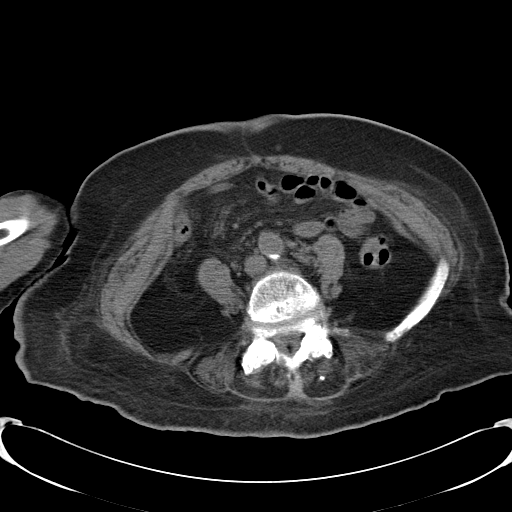
[im 13/52  soft-tissue]
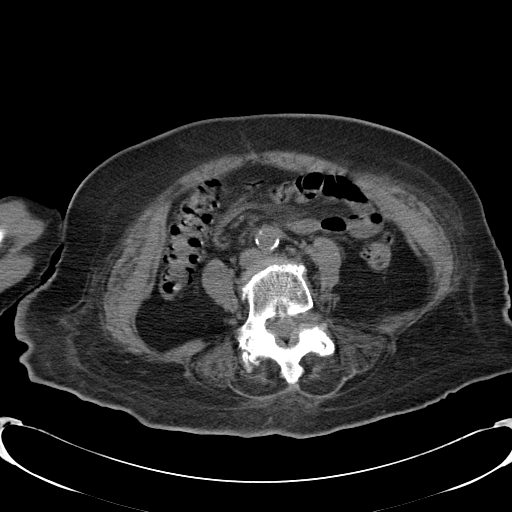
[im 18/52  soft-tissue]
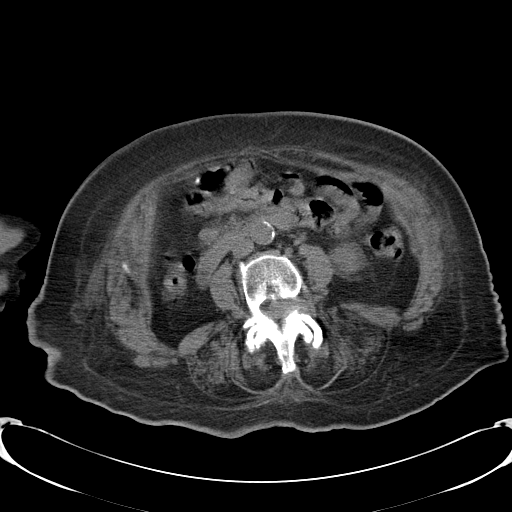
[im 22/52  soft-tissue]
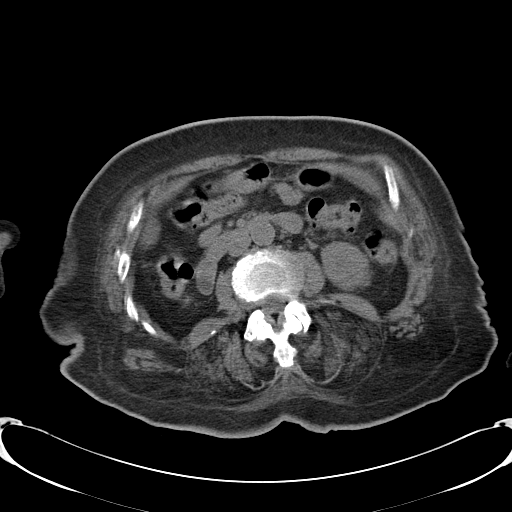
[im 24/52  soft-tissue]
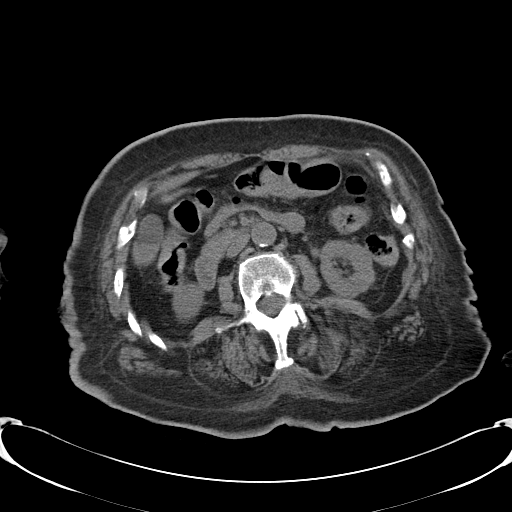
[im 28/52  soft-tissue]
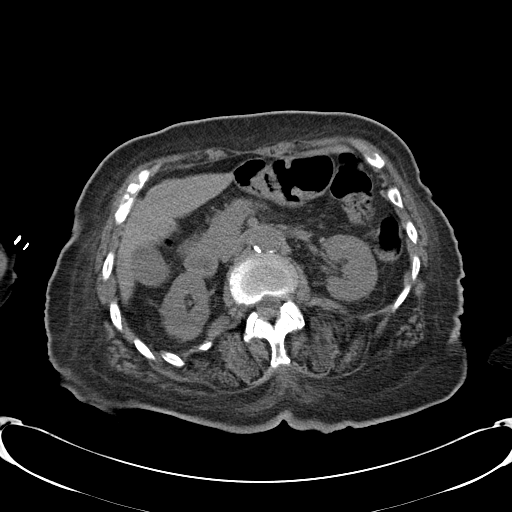
[im 30/52  soft-tissue]
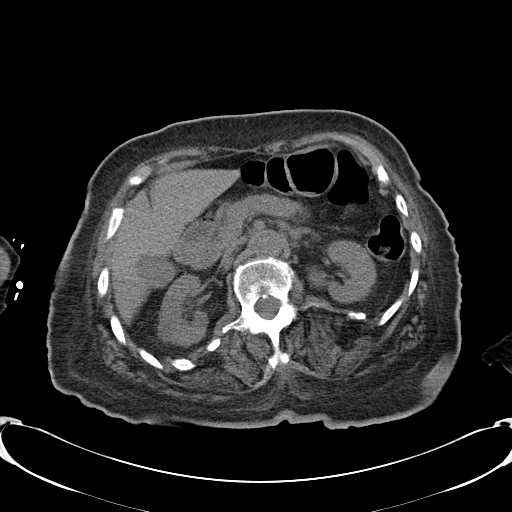
[im 30/52  bone]
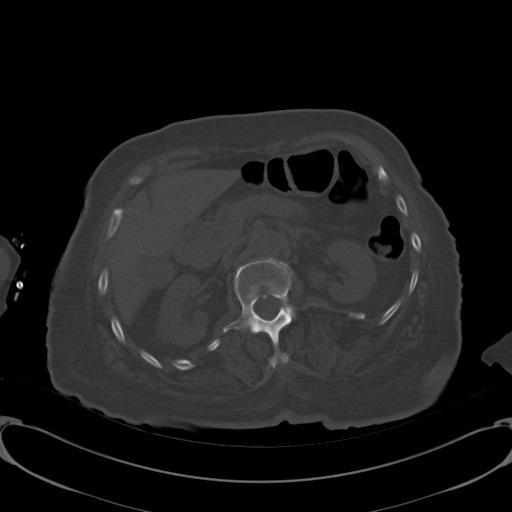
[im 35/52  soft-tissue]
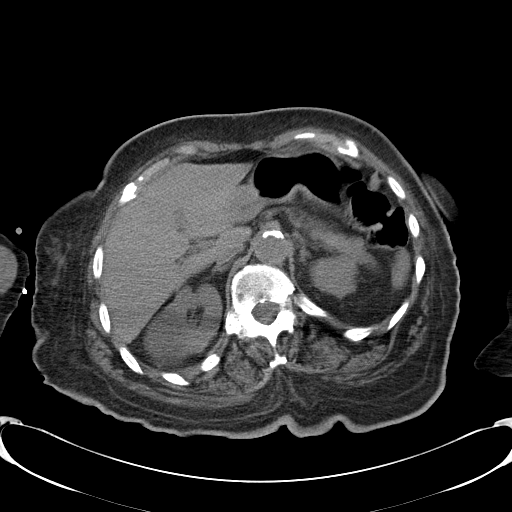
[im 39/52  soft-tissue]
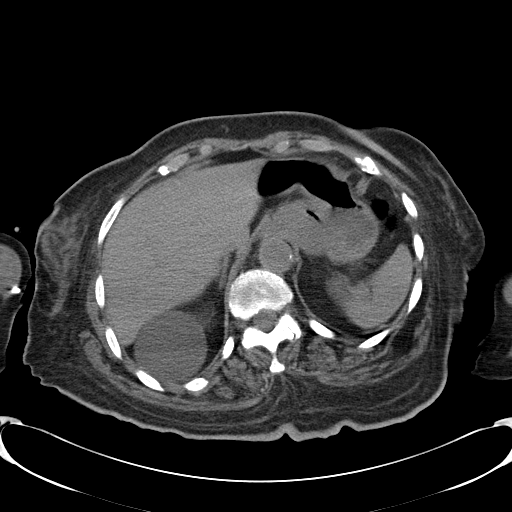
[im 41/52  soft-tissue]
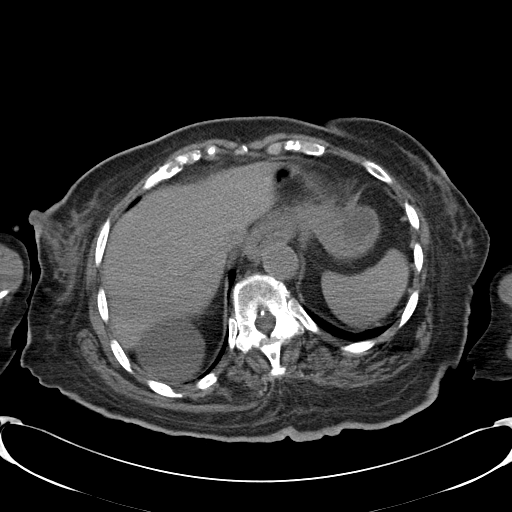
[im 45/52  soft-tissue]
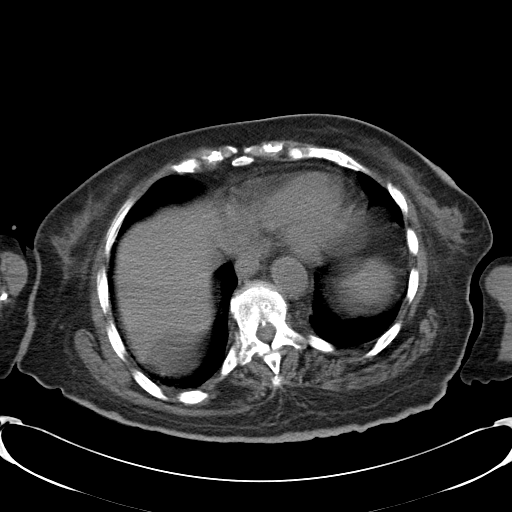
[im 49/52  soft-tissue]
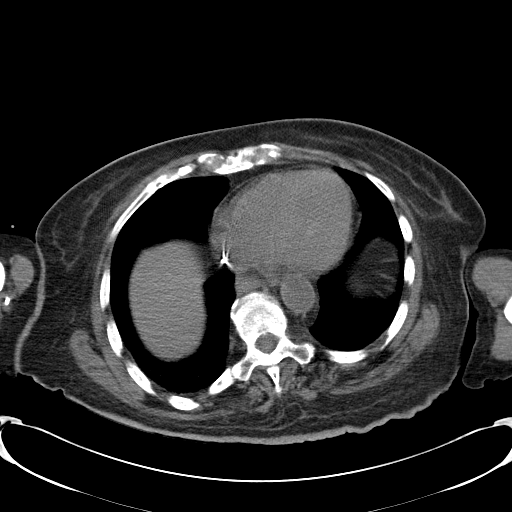

[Series 5: coronal · coronal · 0.66mm/px · 3 of 121 slices shown]
[im 41/121  soft-tissue]
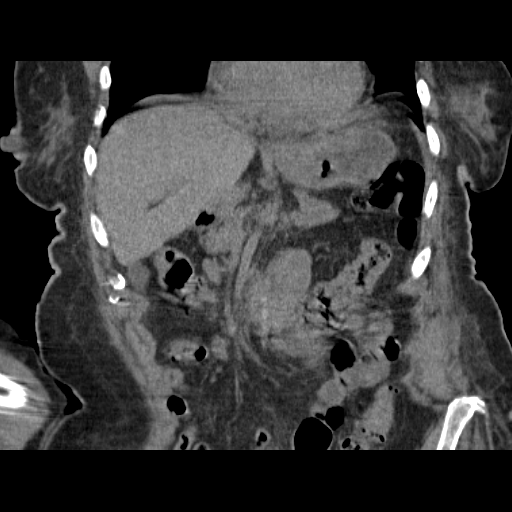
[im 54/121  soft-tissue]
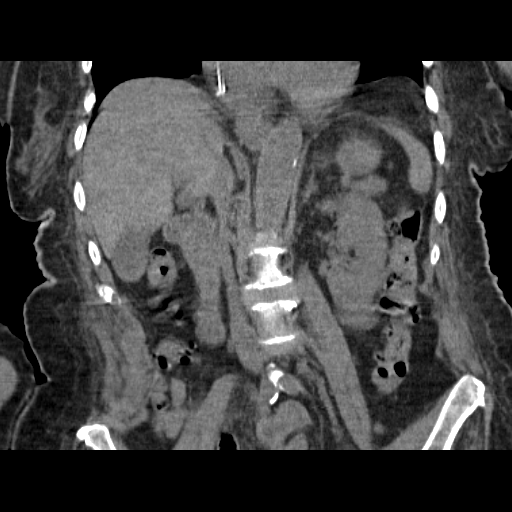
[im 67/121  soft-tissue]
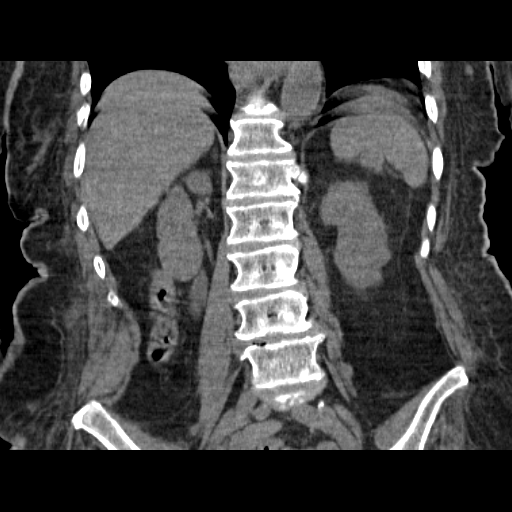

[17 of 46 positions shown; findings below may reference images not displayed]

FINDINGS: The lung bases are grossly clear. The heart is normal in size. A
small to moderate-sized hiatal hernia is noted.

The liver is grossly normal without contrast. No biliary dilatation.
The gallbladder is unremarkable. Layering slightly denser material
in the gallbladder may be sludge. The pancreas is grossly normal.
The spleen is normal in size. No focal lesions. The adrenal glands
should knees are unremarkable except for a large right upper pole
renal cyst.

The stomach is located normally just below the xiphoid process. The
transverse colon is located just inferior to the stomach. No
mesenteric or retroperitoneal mass or adenopathy. There is
tortuosity and calcification of the thoracic aorta. The bony
structures are intact.
IMPRESSION: Unremarkable CT abdomen.

## 2015-06-01 NOTE — Progress Notes (Signed)
Patient ID: Lindsay Browning, female   DOB: September 08, 1919, 81 y.o.   MRN: [id number]                PROGRESS NOTE  DATE:  05/30/2015       [facility]                       LEVEL OF CARE:   SNF   Routine Visit             CHIEF COMPLAINT:  Routine visit/[employer] visit.      HISTORY OF PRESENT ILLNESS:  This is a patient with severe dementia who entered a failure to thrive state in the late summer of 2015.  She had been at [facility] nursing home for roughly 10 months before this with dementia, multiple pressure ulcers, and inability to care for herself at home.  She had been determined in the facility that she was comfort care.  At some point, she transitioned to a Child psychotherapist with DFS for her guardian, who insisted on a feeding tube.  She came to this facility after that.    Miraculously, her heel ulcers and buttock ulcers closed.  However, she did have some reopening on arrival here.  She had underlying osteomyelitis of the left heel in 2015-2016, although this closed, as well.  She continues on PEG tube feeding.    Most recently, over the last two months, she developed a wound on the right fifth toe and the lateral aspect of the right first metatarsal head.  I think these are probably ischemic wounds with a component of pressure.  The area on the right fifth toe is resolved.  She still has a dime-sized wound on the lateral aspect of her right first metatarsal head that really looks surprisingly healthy.    The patient has a lot of pain due to flexion contractures in her legs.  I believe she has a Fentanyl patch and OxyContin p.r.n.     PAST MEDICAL HISTORY/PROBLEM LIST:                Decubitus ulcers, as noted above.     Severe PAD.     Left heel ulcer with underlying osteomyelitis.  Currently, this area is closed.    Protein calorie malnutrition secondary to advanced dementia and failure to thrive.  Now with a PEG tube since late summer of 2015.  This has stabilized this  issue.    Dysphagia secondary to dementia.    Hypothyroidism.  On replacement.     Painful severe flexion contractures.    Severe bilateral osteoarthritis.      CURRENT MEDICATIONS:  Medication list is reviewed.          Prozac 10 q.d.      Multivitamin daily.     Bystolic 10 q.d.      Lidoderm to the left knee daily.    Ferrous sulfate 325 daily.    Vitamin D2, 50,000 U daily.           ProStat 30 mL daily.    Percocet 5/325, 1 tablet via the PEG tube prior to all ADL and wound care.     Fentanyl patch 25 mcg/hr.    Synthroid 75 q.d.     Vitamin C 500 b.i.d.      Nexium 20 mg daily.    Valproic acid 250 b.i.d.      Fibersource HN tube feed at 65 mL/hr.  Free water flushing 350 q.4.        LABORATORY DATA:   Last lab work showed:    White count 7.8, hemoglobin 8.7, platelet count 349.  Her red cell indices are normal.   Previous hemoglobin on 12/31/2014 was 9.3.    Valproic acid level 21.8.      Last TSH was 10.461 on 03/13/2015.    Wound culture from earlier this month was negative.    REVIEW OF SYSTEMS:   Not possible secondary to the advanced state of her dementia.    PHYSICAL EXAMINATION:   VITAL SIGNS:     PULSE:  74.     RESPIRATIONS:  20.    BLOOD PRESSURE:  120/60.    02 SATURATIONS:  92%.   WEIGHT:  Recent weight at 190 pounds GENERAL APPEARANCE:  The patient is seen lying in bed.  She awakens and seems able to mumble a word, but not much is comprehensible.   CHEST/RESPIRATORY:  Clear air entry bilaterally.    CARDIOVASCULAR:   CARDIAC:  Heart sounds are normal.  She appears to be euvolemic.        GASTROINTESTINAL:   ABDOMEN:  PEG site is stable.  There is no tenderness.    LIVER/SPLEEN/KIDNEYS:  No liver, no spleen.   MUSCULOSKELETAL:   EXTREMITIES:   BILATERAL LOWER EXTREMITIES:  Tight bilateral flexion contractures are noted.   SKIN:   INSPECTION:  There is still a wound on the right lateral toe, although this appears healthy.   There is healthy granulation.      ASSESSMENT/PLAN:                    Advanced dementia with all of its complications.   Now with a PEG tube.  She is, however, a DNR in the facility.    Wound on the right lateral first metatarsal head.  This appears stable.  Continue the collagen-based dressings.    Severe chronic pain secondary to flexion contractures and advanced osteoarthritis.  This appears to be controlled for the moment.    Hypothyroidism.  On replacement.    History of dementia with behavioral disturbances.  This was very problematic when she came into the other facility at the end of 2014.  However, this is now much more advanced and I do not see this as a major issue.  She is followed by Psychiatry.    As I mentioned, the wound, which is likely secondary to severe PAD and pressure, is miraculously stable to improved versus when I saw this last month.     C

## 2015-06-17 ENCOUNTER — Other Ambulatory Visit: Payer: Self-pay

## 2015-06-17 MED ORDER — FENTANYL 25 MCG/HR TD PT72: MEDICATED_PATCH | TRANSDERMAL | Status: DC

## 2015-06-17 NOTE — Telephone Encounter (Signed)
Rx faxed to [facility] @ [phone], phone number [phone]  

## 2015-07-02 ENCOUNTER — Encounter (HOSPITAL_COMMUNITY): Payer: Self-pay | Admitting: Emergency Medicine

## 2015-07-02 ENCOUNTER — Emergency Department (HOSPITAL_COMMUNITY)
Admission: EM | Admit: 2015-07-02 | Discharge: 2015-07-02 | Disposition: A | Discharge status: Skilled Nursing Facility | Payer: Medicare Other | Attending: Emergency Medicine | Admitting: Emergency Medicine

## 2015-07-02 DIAGNOSIS — Z87448 Personal history of other diseases of urinary system: Secondary | ICD-10-CM | POA: Diagnosis not present

## 2015-07-02 DIAGNOSIS — Y838 Other surgical procedures as the cause of abnormal reaction of the patient, or of later complication, without mention of misadventure at the time of the procedure: Secondary | ICD-10-CM | POA: Insufficient documentation

## 2015-07-02 DIAGNOSIS — Z880 Allergy status to penicillin: Secondary | ICD-10-CM | POA: Diagnosis not present

## 2015-07-02 DIAGNOSIS — Z79891 Long term (current) use of opiate analgesic: Secondary | ICD-10-CM | POA: Insufficient documentation

## 2015-07-02 DIAGNOSIS — I10 Essential (primary) hypertension: Secondary | ICD-10-CM | POA: Diagnosis not present

## 2015-07-02 DIAGNOSIS — Z79899 Other long term (current) drug therapy: Secondary | ICD-10-CM | POA: Insufficient documentation

## 2015-07-02 DIAGNOSIS — T85598A Other mechanical complication of other gastrointestinal prosthetic devices, implants and grafts, initial encounter: Secondary | ICD-10-CM

## 2015-07-02 NOTE — ED Notes (Signed)
Per ems-- staff from [facility] nursing facility were attempting to feed pt this am and were unable to use her G-tube. Sent here to have tube replaced. No fevers. No other complaints.

## 2015-07-02 NOTE — Discharge Instructions (Signed)
Patient's G-tube was cleared after using [employer].  Of note, she has an expired fentanyl patch on her right upper chest.  Please evaluate this for removal

## 2015-07-02 NOTE — ED Notes (Signed)
Called [facility] for Transport  °

## 2015-07-02 NOTE — ED Provider Notes (Signed)
CSN: [id number]     Arrival date & time 07/02/15  0449 History   First MD Initiated Contact with Patient 07/02/15 0458     No chief complaint on file.    (Consider location/radiation/quality/duration/timing/severity/associated sxs/prior Treatment) HPI 81 year old presents via EMS from nursing home after G-tube was noted be blocked.  This morning when they attempted to feed. Past Medical History  Diagnosis Date  . Medical history unknown   . Hypertension   . Renal disorder    Past Surgical History  Procedure Laterality Date  . Surgical history unknown    . Cardiac surgery      cath  . Foot surgery     No family history on file. Social History  Substance Use Topics  . Smoking status: Unknown If Ever Smoked  . Smokeless tobacco: None     Comment: patient is nonverbal, no family present  . Alcohol Use: No   OB History    No data available     Review of Systems  Unable to perform ROS: Dementia      Allergies  Penicillins  Home Medications   Prior to Admission medications   Medication Sig Start Date End Date Taking? Authorizing Provider  Amino Acids-Protein Hydrolys (FEEDING SUPPLEMENT, PRO-STAT SUGAR FREE 64,) LIQD Place 30 mLs into feeding tube 3 (three) times daily with meals.   Yes Historical Provider, MD  ascorbic acid (VITAMIN C) 500 MG tablet Take 1 tablet (500 mg total) by mouth 2 (two) times daily. Patient taking differently: Give 500 mg by tube 2 (two) times daily.  11/29/13  Yes Shanker Levora Dredge, MD  esomeprazole (NEXIUM) 20 MG capsule Give 20 mg by tube daily at 12 noon.   Yes Historical Provider, MD  ferrous sulfate 325 (65 FE) MG tablet Give 325 mg by tube daily with breakfast.   Yes Historical Provider, MD  FLUoxetine (PROZAC) 10 MG capsule Give 10 mg by tube daily.    Yes Historical Provider, MD  levothyroxine (SYNTHROID, LEVOTHROID) 75 MCG tablet Place 75 mcg into feeding tube daily before breakfast.   Yes Historical Provider, MD  lidocaine (LIDODERM)  5 % Place 1 patch onto the skin daily. Apply to left knee, Remove & Discard patch within 12 hours or as directed by MD   Yes Historical Provider, MD  Multiple Vitamins-Minerals (CERTA-VITE PO) Give 1 tablet by tube daily.    Yes Historical Provider, MD  nebivolol (BYSTOLIC) 10 MG tablet Take 10 mg by mouth daily.   Yes Historical Provider, MD  oxyCODONE-acetaminophen (PERCOCET/ROXICET) 5-325 MG per tablet Take one tablet via tube prior to ADL care once daily.  DO NOT EXCEED 4 GM OF TYLENOL IN 24 HOURS, 04/30/15  Yes Sharon Seller, NP  QUEtiapine (SEROQUEL) 25 MG tablet Give 25 mg by tube 2 (two) times daily.    Yes Historical Provider, MD  senna-docusate (SENOKOT-S) 8.6-50 MG per tablet Take 2 tablets by mouth at bedtime. Patient taking differently: Give 2 tablets by tube at bedtime.  11/29/13  Yes Shanker Levora Dredge, MD  Valproic Acid (DEPAKENE) 250 MG/5ML SYRP syrup Place 250 mg into feeding tube 2 (two) times daily.   Yes Historical Provider, MD  AMBULATORY NON FORMULARY MEDICATION Lorazepam 0.5mg /ml Gel Sig: Apply 1ml topically twice daily as needed for agitation Patient not taking: Reported on 07/02/2015 07/25/14   Oneal Grout, MD  ergocalciferol (VITAMIN D2) 50000 UNITS capsule Give 50,000 Units by tube every 30 (thirty) days.     Historical Provider, MD  fentaNYL (DURAGESIC - DOSED MCG/HR) 12 MCG/HR Place 1 patch (12.5 mcg total) onto the skin every 3 (three) days. Patient not taking: Reported on 07/02/2015 03/11/15   Kirt Boys, DO  fentaNYL (DURAGESIC - DOSED MCG/HR) 25 MCG/HR patch Apply one patch every 72 hours for pain. Remove old patch. External use only. Rotate sites. Check placement of patch every shift 06/17/15   Kirt Boys, DO  LORazepam (ATIVAN) 0.5 MG tablet Take one tablet by mouth twice daily as needed for anxiety Patient not taking: Reported on 07/02/2015 07/29/14   Oneal Grout, MD  NONFORMULARY OR COMPOUNDED ITEM Apply 1 mL topically 2 (two) times daily as needed (for  anxiety or agitation). Lorazepam 1MG /1ML    Historical Provider, MD   BP 149/77 mmHg  Pulse 70  Temp(Src) 98.4 F (36.9 C) (Oral)  Resp 18  SpO2 96% Physical Exam  Constitutional: No distress.  Elderly female, no acute distress  Cardiovascular: Normal rate, regular rhythm, normal heart sounds and intact distal pulses.  Exam reveals no gallop and no friction rub.   No murmur heard. Pulmonary/Chest: Effort normal and breath sounds normal. No respiratory distress. She has no wheezes. She has no rales. She exhibits no tenderness.  Abdominal: She exhibits no mass. There is no tenderness. There is no rebound and no guarding.  G-tube in place, will not flush   Nursing note and vitals reviewed.   ED Course  Procedures (including critical care time) Labs Review Labs Reviewed - No data to display  Imaging Review No results found. I have personally reviewed and evaluated these images and lab results as part of my medical decision-making.   EKG Interpretation None      MDM   Final diagnoses:  Feeding tube blocked, initial encounter    81 year old female with blocked G-tube.  Tube was instilled with 15 cc of [employer] and let sit.  Tube is now clear and able to flush easily.  Plan for discharge back to facility.    Marisa Severin, MD 07/02/15 [phone]

## 2015-07-14 ENCOUNTER — Other Ambulatory Visit: Payer: Self-pay | Admitting: *Deleted

## 2015-07-14 MED ORDER — OXYCODONE-ACETAMINOPHEN 5-325 MG PO TABS: ORAL_TABLET | ORAL | Status: DC

## 2015-07-14 NOTE — Telephone Encounter (Signed)
[facility] 

## 2015-07-15 ENCOUNTER — Other Ambulatory Visit: Payer: Self-pay

## 2015-07-15 MED ORDER — FENTANYL 25 MCG/HR TD PT72: MEDICATED_PATCH | TRANSDERMAL | Status: DC

## 2015-07-15 NOTE — Telephone Encounter (Signed)
Rx faxed to [facility] @ [phone], phone number [phone]  

## 2015-08-08 ENCOUNTER — Other Ambulatory Visit: Payer: Self-pay | Admitting: Internal Medicine

## 2015-08-08 DIAGNOSIS — E46 Unspecified protein-calorie malnutrition: Secondary | ICD-10-CM

## 2015-08-10 ENCOUNTER — Ambulatory Visit (HOSPITAL_COMMUNITY)
Admission: RE | Admit: 2015-08-10 | Discharge: 2015-08-10 | Disposition: A | Discharge status: Home / Self Care | Payer: Medicare Other | Source: Ambulatory Visit | Attending: Internal Medicine | Admitting: Internal Medicine

## 2015-08-10 ENCOUNTER — Other Ambulatory Visit: Payer: Self-pay | Admitting: *Deleted

## 2015-08-10 ENCOUNTER — Inpatient Hospital Stay (HOSPITAL_COMMUNITY): Admission: RE | Admit: 2015-08-10 | Payer: Self-pay | Source: Ambulatory Visit

## 2015-08-10 DIAGNOSIS — E46 Unspecified protein-calorie malnutrition: Secondary | ICD-10-CM

## 2015-08-10 DIAGNOSIS — R1310 Dysphagia, unspecified: Secondary | ICD-10-CM | POA: Diagnosis not present

## 2015-08-10 DIAGNOSIS — K9423 Gastrostomy malfunction: Secondary | ICD-10-CM | POA: Diagnosis present

## 2015-08-10 MED ORDER — FENTANYL 25 MCG/HR TD PT72: MEDICATED_PATCH | TRANSDERMAL | Status: DC

## 2015-08-10 MED ORDER — IOHEXOL 300 MG/ML  SOLN
20.0000 mL | Freq: Once | INTRAMUSCULAR | Status: DC | PRN
  Administered 2015-08-10: 20 mL
  Filled 2015-08-10: qty 20

## 2015-08-10 NOTE — Procedures (Signed)
Successful fluoroscopic guided replacement of a new balloon retention gastrostomy tube.  No immediate post procedural complications.  The feeding tube is ready for immediate use.  Katherina Right, MD Pager #: [phone]

## 2015-08-10 NOTE — Telephone Encounter (Signed)
[facility] 

## 2015-08-18 ENCOUNTER — Non-Acute Institutional Stay (SKILLED_NURSING_FACILITY): Payer: Medicare Other | Admitting: Internal Medicine

## 2015-08-18 DIAGNOSIS — G301 Alzheimer's disease with late onset: Secondary | ICD-10-CM | POA: Diagnosis not present

## 2015-08-18 DIAGNOSIS — F0281 Dementia in other diseases classified elsewhere with behavioral disturbance: Secondary | ICD-10-CM | POA: Diagnosis not present

## 2015-08-18 DIAGNOSIS — G894 Chronic pain syndrome: Secondary | ICD-10-CM

## 2015-08-18 DIAGNOSIS — F02818 Dementia in other diseases classified elsewhere, unspecified severity, with other behavioral disturbance: Secondary | ICD-10-CM

## 2015-08-18 DIAGNOSIS — M2450 Contracture, unspecified joint: Secondary | ICD-10-CM

## 2015-08-24 NOTE — Progress Notes (Addendum)
Patient ID: Lindsay Browning, female   DOB: Apr 04, 1919, 81 y.o.   MRN: [id number]                PROGRESS NOTE  DATE:  08/18/2015          [facility]                     LEVEL OF CARE:   SNF   Routine Visit              CHIEF COMPLAINT:  Routine visit/[employer] visit/review of [employer] records.      HISTORY OF PRESENT ILLNESS:  This is a patient with severe dementia for whom I initially took over care in another facility roughly three years ago.    She had significant dementia which progressed and, late in the summer of 2015, she entered a preterminal state but was thought to be comfort care.  At some point, she transitioned to a Child psychotherapistsocial worker with [employer] for her guardian, who insisted on a feeding tube.  She ultimately came to this facility.    She had severe pressure ulcers when she first arrived here.  However, these were successfully treated.  She also had underlying osteomyelitis in her left heel although this closed, as well, and she has not had any recurrence.      The patient has pain secondary to flexion contractures.  She is on a Duragesic patch and oxycodone p.r.n.     The patient was over in the ER with a blocked feeding tube at the end of September.   I am not aware of any other issues.    SOCIAL HISTORY:        ADVANCED DIRECTIVES:   The patient is a DNR.  There are no other major restrictions.      CURRENT MEDICATIONS:  Medication list is reviewed.                  Prozac 10 q.d.      Multivitamin daily.     Bystolic 10 daily.     Lidoderm patch to the left knee.    Ferrous sulfate 325 daily.    Vitamin D2, 50,000 U monthly.    Synthroid 75.    Oxycodone 5/325, 1 tablet q.a.m.      Duragesic 25 q.72.     Valproic acid 250 b.i.d.       Nexium 20 q.d.     Senokot S 2 tablets at night.               LABORATORY DATA:   On 05/25/2015:     Hemoglobin 8.7, white count 7.8, platelet count 349.     Valproic acid level 21.8.    Anemia panel:  Serum  iron 28, TIBC also low at 219, percent saturation 13.  Vitamin B12 and folate levels were both well within the normal range.  Ferritin 604.     REVIEW OF SYSTEMS:   Not possible from the patient.    PHYSICAL EXAMINATION:   VITAL SIGNS:     PULSE:  70.     RESPIRATIONS:  20.     BLOOD PRESSURE:  151/58.   02 SATURATIONS:  92%.     WEIGHT:  Most recent weight at 205 pounds.    CHEST/RESPIRATORY:  Clear air entry bilaterally.    CARDIOVASCULAR:   CARDIAC:  Heart sounds are normal.      GASTROINTESTINAL:   ABDOMEN:  No masses.  PEG site looks fine.   LIVER/SPLEEN/KIDNEYS:  No liver, no spleen.  No tenderness.    SKIN:   INSPECTION:  I did not see any open areas here.   MUSCULOSKELETAL:   EXTREMITIES:  Bilateral tight flexion contractures noted.     Mental Status: She is able to say a few words other than that severe dementia.   ASSESSMENT/PLAN:                 Advanced dementia with all of its complications.  Now with a PEG tube.  She is a DNR.    History of significant pressure ulcerations.  None currently.  Underlying osteomyelitis in the left foot has not recurred.     Severe chronic pain secondary to flexion contractures and advanced osteoarthritis.  As long as you do not move her, she appears to be stable.    In reviewing the chart, I see a blood sugar listed on 07/23/2015 of 345.  Last lab work glucose from 04/10/2015 was 112, BUN 65, creatinine 1.35, and an albumin of 2.6.  I do not see a hemoglobin A1c.    Her blood sugars will need to be checked, as well as a hemoglobin A1c.

## 2015-09-14 ENCOUNTER — Other Ambulatory Visit: Payer: Self-pay | Admitting: *Deleted

## 2015-09-14 MED ORDER — FENTANYL 25 MCG/HR TD PT72: MEDICATED_PATCH | TRANSDERMAL | Status: DC

## 2015-09-14 NOTE — Telephone Encounter (Signed)
[facility] 

## 2015-10-21 LAB — BASIC METABOLIC PANEL: BUN: 58 mg/dL — AB (ref 4–21)

## 2015-11-09 LAB — HEPATIC FUNCTION PANEL
ALT: 3 U/L — AB (ref 7–35)
AST: 13 U/L (ref 13–35)
Alkaline Phosphatase: 59 U/L (ref 25–125)
BILIRUBIN, TOTAL: 0.2 mg/dL

## 2015-11-09 LAB — CBC AND DIFFERENTIAL
HEMATOCRIT: 30 % — AB (ref 36–46)
HEMOGLOBIN: 9 g/dL — AB (ref 12.0–16.0)
Platelets: 255 10*3/uL (ref 150–399)
WBC: 5.8 10*3/mL

## 2015-12-21 ENCOUNTER — Other Ambulatory Visit: Payer: Self-pay | Admitting: *Deleted

## 2015-12-21 MED ORDER — FENTANYL 25 MCG/HR TD PT72: MEDICATED_PATCH | TRANSDERMAL | Status: DC

## 2015-12-21 NOTE — Telephone Encounter (Signed)
[facility] 

## 2015-12-30 ENCOUNTER — Emergency Department (HOSPITAL_COMMUNITY)
Admission: EM | Admit: 2015-12-30 | Discharge: 2015-12-30 | Disposition: A | Discharge status: Home / Self Care | Payer: Medicare Other | Attending: Emergency Medicine | Admitting: Emergency Medicine

## 2015-12-30 ENCOUNTER — Emergency Department (HOSPITAL_COMMUNITY): Payer: Medicare Other

## 2015-12-30 DIAGNOSIS — I10 Essential (primary) hypertension: Secondary | ICD-10-CM | POA: Insufficient documentation

## 2015-12-30 DIAGNOSIS — Z79899 Other long term (current) drug therapy: Secondary | ICD-10-CM | POA: Insufficient documentation

## 2015-12-30 DIAGNOSIS — T85518A Breakdown (mechanical) of other gastrointestinal prosthetic devices, implants and grafts, initial encounter: Secondary | ICD-10-CM | POA: Diagnosis present

## 2015-12-30 DIAGNOSIS — Y658 Other specified misadventures during surgical and medical care: Secondary | ICD-10-CM | POA: Diagnosis not present

## 2015-12-30 DIAGNOSIS — Z87448 Personal history of other diseases of urinary system: Secondary | ICD-10-CM | POA: Diagnosis not present

## 2015-12-30 DIAGNOSIS — F0390 Unspecified dementia without behavioral disturbance: Secondary | ICD-10-CM | POA: Insufficient documentation

## 2015-12-30 DIAGNOSIS — T85598A Other mechanical complication of other gastrointestinal prosthetic devices, implants and grafts, initial encounter: Secondary | ICD-10-CM

## 2015-12-30 DIAGNOSIS — Z79891 Long term (current) use of opiate analgesic: Secondary | ICD-10-CM | POA: Insufficient documentation

## 2015-12-30 DIAGNOSIS — K9423 Gastrostomy malfunction: Secondary | ICD-10-CM

## 2015-12-30 DIAGNOSIS — Z9889 Other specified postprocedural states: Secondary | ICD-10-CM | POA: Diagnosis not present

## 2015-12-30 DIAGNOSIS — Z880 Allergy status to penicillin: Secondary | ICD-10-CM | POA: Diagnosis not present

## 2015-12-30 MED ORDER — IOHEXOL 300 MG/ML  SOLN
25.0000 mL | Freq: Once | INTRAMUSCULAR | Status: AC | PRN
  Administered 2015-12-30: 25 mL via ORAL

## 2015-12-30 NOTE — ED Notes (Signed)
[facility] [phone]

## 2015-12-30 NOTE — ED Notes (Signed)
Spoke with IR. Radiologist verified placement

## 2015-12-30 NOTE — ED Provider Notes (Signed)
CSN: [id number]     Arrival date & time 12/30/15  1347 History   First MD Initiated Contact with Patient 12/30/15 1348     Chief Complaint  Patient presents with  . Peg Tube Malfunction    HPI Patient presents to the emergency room for evaluation of PEG tube malfunction. Patient resides at a nursing care facility.  She receives her medications and nutrition through the feeding tube. According to staff at the facility, the PEG would not flush properly today. They attempted to arrange for outpatient evaluation brought unsuccessful. She was sent to the emergency room for further evaluation.  The patient is unable to provide any history to me. She mumbles when I speak to her but does not answer questions properly. Past Medical History  Diagnosis Date  . Medical history unknown   . Hypertension   . Renal disorder    Past Surgical History  Procedure Laterality Date  . Surgical history unknown    . Cardiac surgery      cath  . Foot surgery     No family history on file. Social History  Substance Use Topics  . Smoking status: Unknown If Ever Smoked  . Smokeless tobacco: Not on file     Comment: patient is nonverbal, no family present  . Alcohol Use: No   OB History    No data available     Review of Systems  Unable to perform ROS: Dementia      Allergies  Penicillins  Home Medications   Prior to Admission medications   Medication Sig Start Date End Date Taking? Authorizing Provider  AMBULATORY NON FORMULARY MEDICATION Lorazepam 0.5mg /ml Gel Sig: Apply 1ml topically twice daily as needed for agitation Patient not taking: Reported on 07/02/2015 07/25/14   Oneal Grout, MD  Amino Acids-Protein Hydrolys (FEEDING SUPPLEMENT, PRO-STAT SUGAR FREE 64,) LIQD Place 30 mLs into feeding tube 3 (three) times daily with meals.    Historical Provider, MD  ascorbic acid (VITAMIN C) 500 MG tablet Take 1 tablet (500 mg total) by mouth 2 (two) times daily. Patient taking differently: Give 500  mg by tube 2 (two) times daily.  11/29/13   Shanker Levora Dredge, MD  ergocalciferol (VITAMIN D2) 50000 UNITS capsule Give 50,000 Units by tube every 30 (thirty) days.     Historical Provider, MD  esomeprazole (NEXIUM) 20 MG capsule Give 20 mg by tube daily at 12 noon.    Historical Provider, MD  fentaNYL (DURAGESIC - DOSED MCG/HR) 12 MCG/HR Place 1 patch (12.5 mcg total) onto the skin every 3 (three) days. Patient not taking: Reported on 07/02/2015 03/11/15   Kirt Boys, DO  fentaNYL (DURAGESIC - DOSED MCG/HR) 25 MCG/HR patch Apply one patch every 72 hours for pain. Remove old patch. External use only. Rotate sites. Check placement of patch every shift 12/21/15   Tiffany L Reed, DO  ferrous sulfate 325 (65 FE) MG tablet Give 325 mg by tube daily with breakfast.    Historical Provider, MD  FLUoxetine (PROZAC) 10 MG capsule Give 10 mg by tube daily.     Historical Provider, MD  levothyroxine (SYNTHROID, LEVOTHROID) 75 MCG tablet Place 75 mcg into feeding tube daily before breakfast.    Historical Provider, MD  lidocaine (LIDODERM) 5 % Place 1 patch onto the skin daily. Apply to left knee, Remove & Discard patch within 12 hours or as directed by MD    Historical Provider, MD  LORazepam (ATIVAN) 0.5 MG tablet Take one tablet by mouth  twice daily as needed for anxiety Patient not taking: Reported on 07/02/2015 07/29/14   Oneal Grout, MD  Multiple Vitamins-Minerals (CERTA-VITE PO) Give 1 tablet by tube daily.     Historical Provider, MD  nebivolol (BYSTOLIC) 10 MG tablet Take 10 mg by mouth daily.    Historical Provider, MD  NONFORMULARY OR COMPOUNDED ITEM Apply 1 mL topically 2 (two) times daily as needed (for anxiety or agitation). Lorazepam /1ML    Historical Provider, MD  oxyCODONE-acetaminophen (PERCOCET/ROXICET) 5-325 MG per tablet Administer one tablet via tube prior to ADL/wound care daily. Do not exceed 4gm of Tylenol in 24 hours 07/14/15   Sharon Seller, NP  QUEtiapine (SEROQUEL) 25 MG tablet  Give 25 mg by tube 2 (two) times daily.     Historical Provider, MD  senna-docusate (SENOKOT-S) 8.6-50 MG per tablet Take 2 tablets by mouth at bedtime. Patient taking differently: Give 2 tablets by tube at bedtime.  11/29/13   Shanker Levora Dredge, MD  Valproic Acid (DEPAKENE) 250 MG/5ML SYRP syrup Place 250 mg into feeding tube 2 (two) times daily.    Historical Provider, MD   BP 126/44 mmHg  Pulse 71  Temp(Src) 98.7 F (37.1 C) (Oral)  Resp 20  SpO2 100% Physical Exam  Constitutional: No distress.  HENT:  Head: Normocephalic and atraumatic.  Right Ear: External ear normal.  Left Ear: External ear normal.  Mouth/Throat: No oropharyngeal exudate.  Eyes: Conjunctivae are normal. Right eye exhibits no discharge. Left eye exhibits no discharge. No scleral icterus.  Neck: Neck supple. No tracheal deviation present.  Cardiovascular: Normal rate, regular rhythm and intact distal pulses.   Pulmonary/Chest: Effort normal and breath sounds normal. No stridor. No respiratory distress. She has no wheezes. She has no rales.  Abdominal: Soft. Bowel sounds are normal. She exhibits no distension. There is no tenderness. There is no rebound and no guarding.  Feeding tube in the anterior abdominal wall, no erythema or drainage around the site  Musculoskeletal: She exhibits no edema or tenderness.  Lower extremities are contracted  Neurological: She is alert. She displays atrophy. No cranial nerve deficit (no facial droop, extraocular movements intact, ) or sensory deficit. She exhibits abnormal muscle tone. She displays no seizure activity. Coordination abnormal. GCS eye subscore is 4. GCS verbal subscore is 2. GCS motor subscore is 6.  Skin: Skin is warm and dry. No rash noted.  Psychiatric: She has a normal mood and affect.  Nursing note and vitals reviewed.   ED Course  Procedures (including critical care time) I passed a thin nasogastric tube through the feeding tube to help clear it of some of the  debris.  I then was able to flush the tube with water without difficulty.    MDM   Final diagnoses:  Feeding tube blocked, initial encounter    Patient was sent to the emergency room for evaluation of a clogged feeding tube.  We were able to flush the tube.  I did note that the proximal portion of the feeding tube where the Is located is cracked.  The patient should have this feeding tube replaced. However, there is no emergent need at this time. Consult with interventional radiology for follow-up evaluation.  IR consulted for replacement of peg tube    Linwood Dibbles, MD 12/30/15 1620

## 2015-12-30 NOTE — ED Notes (Signed)
Notified [facility] for transportation back home 

## 2015-12-30 NOTE — ED Notes (Signed)
Discharge instructions and yellow DNR sent with patient back to [facility]

## 2015-12-30 NOTE — ED Notes (Signed)
IR at the bedside.

## 2015-12-30 NOTE — ED Notes (Signed)
Attempted to Call Facility x2. No answer

## 2015-12-30 NOTE — ED Notes (Signed)
This RN and MD Lynelle Doctor irrigated and cleaned PEG Tube. Pt's tube was flushed.

## 2015-12-30 NOTE — Discharge Instructions (Signed)
Care of a Feeding Tube People who have trouble swallowing or cannot take food or medicine by mouth are sometimes given feeding tubes. A feeding tube can go into the nose and down to the stomach or through the skin in the abdomen and into the stomach or small bowel. Some of the names of these feeding tubes are gastrostomy tubes, PEG lines, nasogastric tubes, and gastrojejunostomy tubes.  SUPPLIES NEEDED TO CARE FOR THE TUBE SITE  Clean gloves.  Clean wash cloth, gauze pads, or soft paper towel.  Cotton swabs.  Skin barrier ointment or cream.  Soap and water.  Pre-cut foam pads or gauze (that go around the tube).  Tube tape. TUBE SITE CARE 1. Have all supplies ready and available. 2. Wash hands well. 3. Put on clean gloves. 4. Remove the soiled foam pad or gauze, if present, that is found under the tube stabilizer. Change the foam pad or gauze daily or when soiled or moist. 5. Check the skin around the tube site for redness, rash, swelling, drainage, or extra tissue growth. If you notice any of these, call your caregiver. 6. Moisten gauze and cotton swabs with water and soap. 7. Wipe the area closest to the tube (right near the stoma) with cotton swabs. Wipe the surrounding skin with moistened gauze. Rinse with water. 8. Dry the skin and stoma site with a dry gauze pad or soft paper towel. Do not use antibiotic ointments at the tube site. 9. If the skin is red, apply a skin barrier cream or ointment (such as petroleum jelly) in a circular motion, using a cotton swab. The cream or ointment will provide a moisture barrier for the skin and helps with wound healing. 10. Apply a new pre-cut foam pad or gauze around the tube. Secure it with tape around the edges. If no drainage is present, foam pads or gauze may be left off. 11. Use tape or an anchoring device to fasten the feeding tube to the skin for comfort or as directed. Rotate where you tape the tube to avoid skin damage from the  adhesive. 12. Position the person in a semi-upright position (30-45 degree angle). 13. Throw away used supplies. 14. Remove gloves. 15. Wash hands. SUPPLIES NEEDED TO FLUSH A FEEDING TUBE  Clean gloves.  60 mL syringe (that connects to the feeding tube).  Towel.  Water. FLUSHING A FEEDING TUBE  1. Have all supplies ready and available. 2. Wash hands well. 3. Put on clean gloves. 4. Draw up 30 mL of water in the syringe. 5. Kink the feeding tube while disconnecting it from the feeding-bag tubing or while removing the plug at the end of the tube. Kinking closes the tube and prevents secretions in the tube from spilling out. 6. Insert the tip of the syringe into the end of the feeding tube. Release the kink. Slowly inject the water. 7. If unable to inject the water, the person with the feeding tube should lay on his or her left side. The tip of the tube may be against the stomach wall, blocking fluid flow. Changing positions may move the tip away from the stomach wall. After repositioning, try injecting the water again. 8. After injecting the water, remove the syringe. 9. Always flush before giving the first medicine, between medicines, and after the final medicine before starting a feeding. This prevents medicines from clogging the tube. 10. Throw away used supplies. 11. Remove gloves. 12. Wash hands.   This information is not intended to replace   advice given to you by your health care provider. Make sure you discuss any questions you have with your health care provider.   Document Released: 10/24/2005 Document Revised: 10/10/2012 Document Reviewed: 06/07/2012 Elsevier Interactive Patient Education 2016 [employer]   

## 2015-12-30 NOTE — ED Notes (Signed)
Report given to [facility] (April)

## 2015-12-30 NOTE — ED Notes (Addendum)
Per EMS, Pt is coming from [facility]. Pt's PEG Tube would not flush and facility sent her over to have it evaluated. Vitals per EMS: 110 Palpable Systolic, 72 HR, 18 RR, 95% on RA.

## 2015-12-30 NOTE — ED Notes (Signed)
IR Stated patient did not need consent

## 2015-12-30 NOTE — ED Notes (Signed)
Pt cleaned and changed by EMT Denyse Amass

## 2016-01-01 ENCOUNTER — Non-Acute Institutional Stay (SKILLED_NURSING_FACILITY): Payer: Medicare Other | Admitting: Internal Medicine

## 2016-01-01 ENCOUNTER — Encounter: Payer: Self-pay | Admitting: Internal Medicine

## 2016-01-01 DIAGNOSIS — D649 Anemia, unspecified: Secondary | ICD-10-CM | POA: Diagnosis not present

## 2016-01-01 DIAGNOSIS — M24562 Contracture, left knee: Secondary | ICD-10-CM

## 2016-01-01 DIAGNOSIS — M24569 Contracture, unspecified knee: Secondary | ICD-10-CM | POA: Insufficient documentation

## 2016-01-01 DIAGNOSIS — Z931 Gastrostomy status: Secondary | ICD-10-CM | POA: Insufficient documentation

## 2016-01-01 DIAGNOSIS — M24561 Contracture, right knee: Secondary | ICD-10-CM

## 2016-01-01 DIAGNOSIS — F0390 Unspecified dementia without behavioral disturbance: Secondary | ICD-10-CM | POA: Diagnosis not present

## 2016-01-01 DIAGNOSIS — F22 Delusional disorders: Secondary | ICD-10-CM | POA: Diagnosis not present

## 2016-01-01 NOTE — Progress Notes (Signed)
Location:  [facility] Room Number: 407  B Place of Service:  SNF (31) Provider:  Gordy Levan, M.D.   Patient Care Team: [employer] Kimber Relic, MD as PCP - General (Internal Medicine)  Extended Emergency Contact Information Primary Emergency Contact: [facility] Address: [address]          [place], [place] [address] [place] Home Phone: [phone] Mobile Phone: [phone] Relation: Other Secondary Emergency Contact: Horne,[name]  [place] Home Phone: [phone] Relation: Daughter  Code Status:  DO NOT RESUSCITATE Goals of care: Advanced Directive information Advanced Directives 01/01/2016  Does patient have an advance directive? Yes  Type of Advance Directive Out of facility DNR (pink MOST or yellow form)  Pre-existing out of facility DNR order (yellow form or pink MOST form) Yellow form placed in chart (order not valid for inpatient use)     Chief Complaint  Patient presents with  . Medical Management of Chronic Issues    HPI:  Pt is a 82 y.o. female seen today for medical management of chronic diseases.    Patient was seen in the emergency room 12/30/2015 for PEG tube malfunction. The PEG tube would not flush properly. Emergency room physician past with an nasogastric tube through the feeding tube to clear up some of the abdomen was able to flush the tube with water without difficulty. Tube continues to function appropriately at this time.  Patient is severely debilitated, demented. There is evidence in the lab of chronic renal disease. She also has a chronic normocytic anemia. Lab work last year showed normal B12 and folate. Iron indices were low. She remains on supplement in the last hemoglobin was 9 g percent.   Flexion contractures of the knees quite severe and the knees are painful.  Hypertension is well controlled on Bystolic 10 mg daily.   Past Medical History  Diagnosis Date  . Medical  history unknown   . Hypertension   . Renal disorder    Past Surgical History  Procedure Laterality Date  . Surgical history unknown    . Cardiac surgery      cath  . Foot surgery      Allergies  Allergen Reactions  . Penicillins Other (See Comments)    Unknown - pt states she's never taken PCN before, per [facility]  Has patient had a PCN reaction causing immediate rash, facial/tongue/throat swelling, SOB or lightheadedness with hypotension: unknown Has patient had a PCN reaction causing severe rash involving mucus membranes or skin necrosis: unknown Has patient had a PCN reaction that required hospitalization unknown Has patient had a PCN reaction occurring within the last 10 years: unknown If all of the above answers are "NO", then may proceed with C      Medication List       This list is accurate as of: 01/01/16 10:17 AM.  Always use your most recent med list.               CERTA-VITE PO  Give 1 tablet by tube daily.     ergocalciferol 50000 units capsule  Commonly known as:  VITAMIN D2  Give 50,000 Units by tube every 30 (thirty) days.     esomeprazole 20 MG capsule  Commonly known as:  NEXIUM  Give 20 mg by tube daily at 12 noon.     fentaNYL 25 MCG/HR patch  Commonly known as:  DURAGESIC - dosed mcg/hr  Apply one patch every 72 hours  for pain. Remove old patch. External use only. Rotate sites. Check placement of patch every shift     ferrous sulfate 325 (65 FE) MG tablet  Give 325 mg by tube daily with breakfast.     levothyroxine 75 MCG tablet  Commonly known as:  SYNTHROID, LEVOTHROID  Place 75 mcg into feeding tube daily before breakfast.     lidocaine 5 %  Commonly known as:  LIDODERM  Place 1 patch onto the skin daily. Apply to left knee, Remove & Discard patch within 12 hours or as directed by MD     nebivolol 10 MG tablet  Commonly known as:  BYSTOLIC  10 mg by PEG Tube route daily.     senna-docusate 8.6-50 MG tablet  Commonly known as:   Senokot-S  Take 2 tablets by mouth at bedtime.     Valproic Acid 250 MG/5ML Syrp syrup  Commonly known as:  DEPAKENE  Place 250 mg into feeding tube 2 (two) times daily.        Review of Systems  Constitutional: Negative for fever, chills, diaphoresis, activity change, appetite change, fatigue and unexpected weight change.       Elderly, frail  HENT: Negative for congestion, ear discharge, ear pain, hearing loss, postnasal drip, rhinorrhea, sore throat, tinnitus, trouble swallowing and voice change.   Eyes: Negative for pain, redness, itching and visual disturbance.  Respiratory: Negative for cough, choking, shortness of breath and wheezing.   Cardiovascular: Negative for chest pain, palpitations and leg swelling.  Gastrointestinal: Negative for nausea, abdominal pain, diarrhea, constipation and abdominal distention.       History of dysphagia. Currently on PEG tube feedings.  Endocrine: Negative for cold intolerance, heat intolerance, polydipsia, polyphagia and polyuria.       Hypothyroid  Genitourinary: Negative for dysuria, urgency, frequency, hematuria, flank pain, vaginal discharge, difficulty urinating and pelvic pain.       Incontinent  Musculoskeletal: Negative for myalgias, back pain, arthralgias, gait problem, neck pain and neck stiffness.       History of contractures in both knees  Skin: Negative for color change, pallor and rash.  Allergic/Immunologic: Negative.   Neurological: Positive for speech difficulty and weakness. Negative for dizziness, tremors, seizures, syncope, numbness and headaches.       Severe dementia. Babbles. No appropriate responses.  Hematological: Negative for adenopathy. Does not bruise/bleed easily.       Chronic normocytic anemia  Psychiatric/Behavioral: Positive for dysphoric mood. Negative for suicidal ideas, hallucinations, behavioral problems, confusion, sleep disturbance and agitation. The patient is not nervous/anxious and is not hyperactive.      Immunization History  Administered Date(s) Administered  . Influenza,inj,Quad PF,36+ Mos 07/21/2014  . Influenza-Unspecified 08/26/2015  . Pneumococcal Polysaccharide-23 07/13/2014   Pertinent  Health Maintenance Due  Topic Date Due  . DEXA SCAN  03/19/1986  . PNA vac Low Risk Adult (2 of 2 - PCV13) 07/14/2015  . INFLUENZA VACCINE  06/07/2016   No flowsheet data found. Functional Status Survey:    Filed Vitals:   01/01/16 1016  BP: 123/53  Pulse: 70  Temp: 98.8 F (37.1 C)  Resp: 18   There is no weight on file to calculate BMI. Physical Exam  Constitutional: No distress.  HENT:  Right Ear: External ear normal.  Left Ear: External ear normal.  Nose: Nose normal.  Mouth/Throat: Oropharynx is clear and moist. No oropharyngeal exudate.  Eyes: Conjunctivae and EOM are normal. Pupils are equal, round, and reactive to light. No scleral icterus.  Neck: No JVD present. No tracheal deviation present. No thyromegaly present.  Cardiovascular: Normal rate, regular rhythm, normal heart sounds and intact distal pulses.  Exam reveals no gallop and no friction rub.   No murmur heard. Pulmonary/Chest: Effort normal. No respiratory distress. She has no wheezes. She has no rales. She exhibits no tenderness.  Abdominal: She exhibits no distension and no mass. There is no tenderness.  Musculoskeletal: Normal range of motion. She exhibits no edema or tenderness.  Severe contractures of both knees.  Lymphadenopathy:    She has no cervical adenopathy.  Neurological: She is alert. No cranial nerve deficit. Coordination normal.  Severely demented. Babbling. No appropriate responses.  Skin: No rash noted. She is not diaphoretic. No erythema. No pallor.  Sacral scars from decubitus ulcer  Psychiatric: She has a normal mood and affect. Her behavior is normal. Judgment and thought content normal.    Labs reviewed:  Recent Labs  10/21/15  BUN 58*    Recent Labs  11/09/15  AST 13    ALT 3*  ALKPHOS 59    Recent Labs  11/09/15  WBC 5.8  HGB 9.0*  HCT 30*  PLT 255   Lab Results  Component Value Date   TSH 6.420* 07/13/2014   No results found for: HGBA1C No results found for: CHOL, HDL, LDLCALC, LDLDIRECT, TRIG, CHOLHDL  Significant Diagnostic Results in last 30 days:  Dg Abd Portable 1v  12/31/2015  CLINICAL DATA:  82 year old female with a punctured gastrostomy tube. Patient requires tube exchange prior to return to outpatient center. EXAM: PORTABLE ABDOMEN - 1 VIEW COMPARISON:  None. FINDINGS: The existing tube retention balloon was deflated and the tube removed. A new gastrostomy tube was advanced through the whole. The retention balloon was inflated and pulled against the anterior abdominal wall. Contrast material is injected and a portable KUB obtained. The gastrostomy tube appears to be located within the gastric antrum versus proximal duodenum. IMPRESSION: Tube tip within the gastric antrum versus proximal duodenum. Electronically Signed   By: Malachy Moan M.D.   On: 12/31/2015 13:23    Assessment/Plan 1. Dementia, without behavioral disturbance Chronically bedridden. Very little interaction with examiner today. Babbling at times.  2. Delusional disorder [facility]) Has been seen by psychiatry consultant. She remains on Depakote.  3. Anemia, unspecified anemia type Possibly related to chronic kidney disease. There was an iron deficiency component to it in the past. She remains on iron supplements.  4. S/P percutaneous endoscopic gastrostomy (PEG) tube placement [facility]) This is a route for all intake. She had a significant dysphagia past and severe protein calorie malnutrition. This seems to have resolved with the tube feedings.  5. Contractures of both knees Both knees are painful and she has lidocaine patches on the left knee. Contractures are severe.

## 2016-01-13 ENCOUNTER — Emergency Department (HOSPITAL_COMMUNITY)
Admission: EM | Admit: 2016-01-13 | Discharge: 2016-01-13 | Disposition: A | Discharge status: Home / Self Care | Payer: Medicare Other | Attending: Emergency Medicine | Admitting: Emergency Medicine

## 2016-01-13 ENCOUNTER — Emergency Department (HOSPITAL_COMMUNITY): Payer: Medicare Other

## 2016-01-13 ENCOUNTER — Encounter (HOSPITAL_COMMUNITY): Payer: Self-pay | Admitting: Emergency Medicine

## 2016-01-13 DIAGNOSIS — Z880 Allergy status to penicillin: Secondary | ICD-10-CM | POA: Insufficient documentation

## 2016-01-13 DIAGNOSIS — Z79899 Other long term (current) drug therapy: Secondary | ICD-10-CM | POA: Diagnosis not present

## 2016-01-13 DIAGNOSIS — I10 Essential (primary) hypertension: Secondary | ICD-10-CM | POA: Diagnosis not present

## 2016-01-13 DIAGNOSIS — Z87448 Personal history of other diseases of urinary system: Secondary | ICD-10-CM | POA: Insufficient documentation

## 2016-01-13 DIAGNOSIS — Z431 Encounter for attention to gastrostomy: Secondary | ICD-10-CM | POA: Diagnosis present

## 2016-01-13 MED ORDER — IOHEXOL 300 MG/ML  SOLN
40.0000 mL | Freq: Once | INTRAMUSCULAR | Status: AC | PRN
  Administered 2016-01-13: 40 mL via INTRATHECAL

## 2016-01-13 NOTE — ED Notes (Signed)
Per EMS, from [facility], pulled out PEG tube, facility was having difficulty re-advancing the tube. Was having some blood from insertion site. Pt is at her baseline per SNF report.

## 2016-01-13 NOTE — ED Provider Notes (Signed)
CSN: [id number]     Arrival date & time 01/13/16  1338 History   First MD Initiated Contact with Patient 01/13/16 1511     Chief Complaint  Patient presents with  . PEG dislodged      The history is provided by the EMS personnel and the nursing home. No language interpreter was used.   Lindsay Browning is a 82 y.o. female who presents to the Emergency Department complaining of PEG tube dislodgment. Level V Twice a day to dementia. History is provided by EMS and [facility]. Per report the tube was pulled out and the facility had difficulty replacing the tube. She has a 20 [place] gastrostomy tube in the upper abdomen. It was last replaced February 22 of this year. Patient is nonverbal and has no complaints. Per nursing home and the patient was getting changed today and the tube came partially out. They attempted to flush air through the tube and heard a funny noise. They attempted to flush water as well and it seemed to leak around the tube itself. She was sent into the emergency department for further evaluation.  Past Medical History  Diagnosis Date  . Medical history unknown   . Hypertension   . Renal disorder    Past Surgical History  Procedure Laterality Date  . Surgical history unknown    . Cardiac surgery      cath  . Foot surgery     History reviewed. No pertinent family history. Social History  Substance Use Topics  . Smoking status: Unknown If Ever Smoked  . Smokeless tobacco: None     Comment: patient is nonverbal, no family present  . Alcohol Use: No   OB History    No data available     Review of Systems  All other systems reviewed and are negative.     Allergies  Penicillins  Home Medications   Prior to Admission medications   Medication Sig Start Date End Date Taking? Authorizing Provider  esomeprazole (NEXIUM) 20 MG capsule Give 20 mg by tube daily.    Yes Historical Provider, MD  fentaNYL (DURAGESIC - DOSED MCG/HR) 25 MCG/HR  patch Apply one patch every 72 hours for pain. Remove old patch. External use only. Rotate sites. Check placement of patch every shift Patient taking differently: Place 25 mcg onto the skin every 3 (three) days. Apply one patch every 72 hours for pain. Remove old patch. External use only. Rotate sites. Check placement of patch every shift 12/21/15  Yes Tiffany L Reed, DO  ipratropium-albuterol (DUONEB) 0.5-2.5 (3) MG/3ML SOLN Take 3 mLs by nebulization every 4 (four) hours as needed (shortness of breath/ wheezing).   Yes Historical Provider, MD  senna-docusate (SENOKOT-S) 8.6-50 MG per tablet Take 2 tablets by mouth at bedtime. Patient taking differently: Give 2 tablets by tube 2 (two) times daily.  11/29/13  Yes Shanker Levora DredgeM Ghimire, MD  Valproic Acid (DEPAKENE) 250 MG/5ML SYRP syrup Place 250 mg into feeding tube 2 (two) times daily.   Yes Historical Provider, MD  ergocalciferol (VITAMIN D2) 50000 UNITS capsule Give 50,000 Units by tube every 30 (thirty) days.     Historical Provider, MD  ferrous sulfate 325 (65 FE) MG tablet Give 325 mg by tube daily with breakfast.    Historical Provider, MD  levothyroxine (SYNTHROID, LEVOTHROID) 75 MCG tablet Place 75 mcg into feeding tube daily before breakfast.    Historical Provider, MD  lidocaine (LIDODERM) 5 % Place 1 patch onto the  skin daily. Apply to left knee, Remove & Discard patch within 12 hours or as directed by MD    Historical Provider, MD  Multiple Vitamins-Minerals (CERTA-VITE PO) Give 1 tablet by tube daily.     Historical Provider, MD  nebivolol (BYSTOLIC) 10 MG tablet 10 mg by PEG Tube route daily.     Historical Provider, MD   BP 155/64 mmHg  Pulse 69  Temp(Src) 98.1 F (36.7 C) (Oral)  Resp 18  SpO2 97% Physical Exam  Constitutional: She appears well-developed and well-nourished.  HENT:  Head: Normocephalic and atraumatic.  Cardiovascular: Normal rate and regular rhythm.   No murmur heard. Pulmonary/Chest: Effort normal and breath  sounds normal. No respiratory distress.  Abdominal: Soft. There is no rebound and no guarding.  20 french Gastrostomy tube in the upper abdominal wall with a small amount of watery drainage surrounding the insertion site. There is minimal local tenderness.  Musculoskeletal: She exhibits no edema or tenderness.  Neurological: She is alert.  Nonverbal  Skin: Skin is warm and dry.  Psychiatric:  Unable to assess  Nursing note and vitals reviewed.   ED Course  Procedures   Gastrostomy tube replacement: Patient's original gastrostomy tube had saline removed from the balloon. The tube was removed without any difficulty. New 20 French gastrostomy tube was introduced through the stoma. Balloon was filled with 10 mL of saline. The gastrostomy tube was secured at 6 cm at the abdominal wall. Able to flush saline and air without difficulty, return of gastric contents without difficulty. Patient tolerated the procedure well.  Labs Review Labs Reviewed - No data to display  Imaging Review Dg Abd 1 View  01/13/2016  CLINICAL DATA:  Feeding tube placement EXAM: ABDOMEN - 1 VIEW COMPARISON:  12/30/2015 FINDINGS: Feeding tube in the gastric antrum. Contrast injected through the tube filling the stomach. No extravasation. No bowel obstruction. IMPRESSION: Feeding tube in the gastric antrum. Contrast fills the stomach without extravasation. Electronically Signed   By: Marlan Palau M.D.   On: 01/13/2016 18:41   I have personally reviewed and evaluated these images and lab results as part of my medical decision-making.   EKG Interpretation None      MDM   Final diagnoses:  PEG (percutaneous endoscopic gastrostomy) adjustment/replacement/removal [facility])    Patient here for gastrostomy tube evaluation. Attempted to flush existing tube but there was significant leakage of fluids around the gastrostomy tube. The tube was replaced with a new gastrostomy tube and placement was confirmed on x-ray.  No  significant leakage of fluid surrounds the gastrostomy tube. Patient returned to facility.    Tilden Fossa, MD 01/14/16 [phone]

## 2016-01-13 NOTE — Discharge Instructions (Signed)
Gastrostomy Tube Replacement  A gastrostomy tube replacement is a procedure to change the tube that goes into your stomach. This may be a planned procedure, or it may be an emergency procedure if your tube has come out or is not working.   LET YOUR HEALTH CARE PROVIDER KNOW ABOUT:   Any allergies you have.   All medicines you are taking, including vitamins, herbs, eye drops, creams, and over-the-counter medicines.   Previous problems you or members of your family have had with the use of anesthetics.   Any blood disorders you have.   Any surgeries you have had.   Medical conditions you have.  RISKS AND COMPLICATIONS  Generally, this is a safe procedure. However, problems can occur and include:   Bleeding.   Infection.   Leaking.  BEFORE THE PROCEDURE   Let your health care provider know how long you have had your gastrostomy tube. If your tube has been in place for less than two weeks, you may need another surgical placement procedure, not just a replacement.   If your tube has come out at home, bring the tube with you so your health care provider can see the type you are using.  PROCEDURE   You may be given a medicine that numbs the insertion area (local anesthetic).   If your gastrostomy tube is partially displaced, or is in place but not working, it will be removed.   If you have an inflatable tube, your health care provider may deflate the balloon at the end of the tube with a syringe.   Your health care provider will apply pressure to your belly as the tube is pulled out. Then the health care provider will gently probe the opening of your gastrostomy to check it.   The opening for the gastrostomy tube may be lubricated with a jelly-like ointment.   You will get a new tube.   If the new tube does not go in easily, you may have a smaller tube put in to keep the track open.   The new tube will be secured in place.  AFTER THE PROCEDURE   You may have an X-ray to make sure the new tube is in the  right place and is working well. To do this, your health care provider will put a liquid that shows up on X-rays through the tube. The X-ray checks that the fluid is not leaking outside of your stomach.     This information is not intended to replace advice given to you by your health care provider. Make sure you discuss any questions you have with your health care provider.     Document Released: 07/19/2001 Document Revised: 11/14/2014 Document Reviewed: 03/05/2014  Elsevier Interactive Patient Education 2016 [employer]

## 2016-01-13 NOTE — Progress Notes (Unsigned)
Patient ID: Lindsay Browning, female   DOB: 1919/07/29, 82 y.o.   MRN: [id number] Erroronus encounter

## 2016-01-13 NOTE — ED Notes (Signed)
Bed: [facility] Expected date:  Expected time:  Means of arrival:  Comments: 

## 2016-01-13 NOTE — ED Notes (Signed)
[facility]

## 2016-01-22 ENCOUNTER — Non-Acute Institutional Stay (SKILLED_NURSING_FACILITY): Payer: Medicare Other | Admitting: Internal Medicine

## 2016-01-22 ENCOUNTER — Encounter: Payer: Self-pay | Admitting: Internal Medicine

## 2016-01-22 DIAGNOSIS — I10 Essential (primary) hypertension: Secondary | ICD-10-CM

## 2016-01-22 DIAGNOSIS — K921 Melena: Secondary | ICD-10-CM

## 2016-01-22 DIAGNOSIS — F0390 Unspecified dementia without behavioral disturbance: Secondary | ICD-10-CM | POA: Diagnosis not present

## 2016-01-22 NOTE — Progress Notes (Signed)
Location:  [facility] Room Number: 407-B Place of Service:  SNF (31) Provider:GREEN, Lenon Curt, MD  Patient Care Team: Kimber Relic, MD as PCP - General (Internal Medicine)  Extended Emergency Contact Information Primary Emergency Contact: Woolard,Dontay Address: [address]          [place], [place] [address] [place] of [place] Home Phone: [phone] Mobile Phone: [phone] Relation: Other Secondary Emergency Contact: Horne,[name]  [place] Home Phone: [phone] Relation: Daughter  Code Status:  DNR Goals of care: Advanced Directive information Advanced Directives 01/22/2016  Does patient have an advance directive? Yes  Type of Advance Directive Out of facility DNR (pink MOST or yellow form)  Pre-existing out of facility DNR order (yellow form or pink MOST form) -     Chief Complaint  Patient presents with  . Acute Visit    HPI:  Pt is a 82 y.o. female seen today for an acute visit for Follow-up: We received 01/13/2016. Staff described In her breathing and on her bed pad. I ordered a urinalysis and culture. Later on the same day there appeared to be a problem with the PEG tube. She was sent to the emergency room to evaluate this emergency room felt that the PEG tube and then dislodged and replaced it. She was returned to [facility] following that. PEG tube is continued to function well area there has been no further evidence of blood in the perineal area.  CBC was not done in the emergency room. Previously requested urinalysis and culture has not been completed.  Patient is status post CVA with severe dementia. She is dependent upon PEG tube feedings for intake.  Inspection of the perineal area today did not show any cracks in the skin, ulcerations, or blood.   Past Medical History  Diagnosis Date  . Medical history unknown   . Hypertension   . Renal disorder    Past Surgical History  Procedure Laterality  Date  . Surgical history unknown    . Cardiac surgery      cath  . Foot surgery      Allergies  Allergen Reactions  . Penicillins Other (See Comments)    Unknown - pt states she's never taken PCN before, per [facility]  Has patient had a PCN reaction causing immediate rash, facial/tongue/throat swelling, SOB or lightheadedness with hypotension: unknown Has patient had a PCN reaction causing severe rash involving mucus membranes or skin necrosis: unknown Has patient had a PCN reaction that required hospitalization unknown Has patient had a PCN reaction occurring within the last 10 years: unknown If all of the above answers are "NO", then may proceed with C      Medication List       This list is accurate as of: 01/22/16 11:54 AM.  Always use your most recent med list.               CERTA-VITE PO  Give 1 tablet by tube daily.     ergocalciferol 50000 units capsule  Commonly known as:  VITAMIN D2  Give 50,000 Units by tube every 30 (thirty) days.     esomeprazole 20 MG capsule  Commonly known as:  NEXIUM  Give 20 mg by tube daily.     fentaNYL 25 MCG/HR patch  Commonly known as:  DURAGESIC - dosed mcg/hr  Apply one patch every 72 hours for pain. Remove old patch. External use only. Rotate sites. Check placement of patch every shift  ferrous sulfate 325 (65 FE) MG tablet  Give 325 mg by tube daily with breakfast.     ipratropium-albuterol 0.5-2.5 (3) MG/3ML Soln  Commonly known as:  DUONEB  Take 3 mLs by nebulization every 4 (four) hours as needed (shortness of breath/ wheezing).     levothyroxine 100 MCG tablet  Commonly known as:  SYNTHROID, LEVOTHROID  Take 100 mcg by mouth daily before breakfast.     lidocaine 5 %  Commonly known as:  LIDODERM  Place 1 patch onto the skin daily. Apply to left knee, Remove & Discard patch within 12 hours or as directed by MD     nebivolol 10 MG tablet  Commonly known as:  BYSTOLIC  10 mg by PEG Tube route daily.      senna-docusate 8.6-50 MG tablet  Commonly known as:  Senokot-S  Take 2 tablets by mouth at bedtime.     Valproic Acid 250 MG/5ML Syrp syrup  Commonly known as:  DEPAKENE  Place 250 mg into feeding tube 2 (two) times daily.        Review of Systems  Constitutional: Negative for fever, chills, diaphoresis, activity change, appetite change, fatigue and unexpected weight change.       Elderly, frail  HENT: Negative for congestion, ear discharge, ear pain, hearing loss, postnasal drip, rhinorrhea, sore throat, tinnitus, trouble swallowing and voice change.   Eyes: Negative for pain, redness, itching and visual disturbance.  Respiratory: Negative for cough, choking, shortness of breath and wheezing.   Cardiovascular: Negative for chest pain, palpitations and leg swelling.  Gastrointestinal: Negative for nausea, abdominal pain, diarrhea, constipation and abdominal distention.       History of dysphagia. Currently on PEG tube feedings.  Endocrine: Negative for cold intolerance, heat intolerance, polydipsia, polyphagia and polyuria.       Hypothyroid  Genitourinary: Negative for dysuria, urgency, frequency, hematuria, flank pain, vaginal discharge, difficulty urinating and pelvic pain.       Incontinent  Musculoskeletal: Negative for myalgias, back pain, arthralgias, gait problem, neck pain and neck stiffness.       History of contractures in both knees  Skin: Negative for color change, pallor and rash.  Allergic/Immunologic: Negative.   Neurological: Positive for speech difficulty and weakness. Negative for dizziness, tremors, seizures, syncope, numbness and headaches.       Severe dementia. Babbles. No appropriate responses.  Hematological: Negative for adenopathy. Does not bruise/bleed easily.       Chronic normocytic anemia  Psychiatric/Behavioral: Positive for dysphoric mood. Negative for suicidal ideas, hallucinations, behavioral problems, confusion, sleep disturbance and agitation. The  patient is not nervous/anxious and is not hyperactive.     Immunization History  Administered Date(s) Administered  . Influenza,inj,Quad PF,36+ Mos 07/21/2014  . Influenza-Unspecified 08/26/2015  . Pneumococcal Polysaccharide-23 07/13/2014   Pertinent  Health Maintenance Due  Topic Date Due  . DEXA SCAN  03/19/1986  . PNA vac Low Risk Adult (2 of 2 - PCV13) 07/14/2015  . INFLUENZA VACCINE  06/07/2016    Filed Vitals:   01/22/16 1149  BP: 122/76  Pulse: 78  Temp: 98.1 F (36.7 C)  TempSrc: Oral  Resp: 19  Height: 5\' 5"  (1.651 m)  Weight: 197 lb (89.359 kg)  SpO2: 97%   Body mass index is 32.78 kg/(m^2). Physical Exam  Constitutional: No distress.  HENT:  Right Ear: External ear normal.  Left Ear: External ear normal.  Nose: Nose normal.  Mouth/Throat: Oropharynx is clear and moist. No oropharyngeal exudate.  Eyes:  Conjunctivae and EOM are normal. Pupils are equal, round, and reactive to light. No scleral icterus.  Neck: No JVD present. No tracheal deviation present. No thyromegaly present.  Cardiovascular: Normal rate, regular rhythm, normal heart sounds and intact distal pulses.  Exam reveals no gallop and no friction rub.   No murmur heard. Pulmonary/Chest: Effort normal. No respiratory distress. She has no wheezes. She has no rales. She exhibits no tenderness.  Abdominal: She exhibits no distension and no mass. There is no tenderness.  Musculoskeletal: Normal range of motion. She exhibits no edema or tenderness.  Severe contractures of both knees.  Lymphadenopathy:    She has no cervical adenopathy.  Neurological: She is alert. No cranial nerve deficit. Coordination normal.  Severely demented. Babbling. No appropriate responses.  Skin: No rash noted. She is not diaphoretic. No erythema. No pallor.  Sacral scars from decubitus ulcer  Psychiatric: She has a normal mood and affect. Her behavior is normal. Judgment and thought content normal.    Labs  reviewed:  Recent Labs  10/21/15  BUN 58*    Recent Labs  11/09/15  AST 13  ALT 3*  ALKPHOS 59    Recent Labs  11/09/15  WBC 5.8  HGB 9.0*  HCT 30*  PLT 255   Lab Results  Component Value Date   TSH 6.420* 07/13/2014   No results found for: HGBA1C No results found for: CHOL, HDL, LDLCALC, LDLDIRECT, TRIG, CHOLHDL  Significant Diagnostic Results in last 30 days:  Dg Abd 1 View  01/13/2016  CLINICAL DATA:  Feeding tube placement EXAM: ABDOMEN - 1 VIEW COMPARISON:  12/30/2015 FINDINGS: Feeding tube in the gastric antrum. Contrast injected through the tube filling the stomach. No extravasation. No bowel obstruction. IMPRESSION: Feeding tube in the gastric antrum. Contrast fills the stomach without extravasation. Electronically Signed   By: Marlan Palau M.D.   On: 01/13/2016 18:41   Dg Abd Portable 1v  12/31/2015  CLINICAL DATA:  82 year old female with a punctured gastrostomy tube. Patient requires tube exchange prior to return to outpatient center. EXAM: PORTABLE ABDOMEN - 1 VIEW COMPARISON:  None. FINDINGS: The existing tube retention balloon was deflated and the tube removed. A new gastrostomy tube was advanced through the whole. The retention balloon was inflated and pulled against the anterior abdominal wall. Contrast material is injected and a portable KUB obtained. The gastrostomy tube appears to be located within the gastric antrum versus proximal duodenum. IMPRESSION: Tube tip within the gastric antrum versus proximal duodenum. Electronically Signed   By: Malachy Moan M.D.   On: 12/31/2015 13:23    Assessment/Plan 1. Hematochezia CBC, future Reticulocyte count, future Rule out urinary tract infection-urinalysis and culture by catheterization  2. Dementia, without behavioral disturbance Unchanged  3. Essential hypertension Controlled   Labs/tests ordered: CBC, reticulocyte count, CMP, urinalysis and culture

## 2016-02-05 ENCOUNTER — Non-Acute Institutional Stay (SKILLED_NURSING_FACILITY): Payer: Medicare Other | Admitting: Internal Medicine

## 2016-02-05 ENCOUNTER — Encounter: Payer: Self-pay | Admitting: Internal Medicine

## 2016-02-05 DIAGNOSIS — R238 Other skin changes: Secondary | ICD-10-CM

## 2016-02-05 NOTE — Progress Notes (Signed)
Patient ID: Lindsay Browning, female   DOB: Jun 19, 1919, 82 y.o.   MRN: [id number]  Location:  [facility] Room Number: 407 Place of Service:  SNF (31) Provider:  Estill Dooms, MD  Patient Care Team: Estill Dooms, MD as PCP - General (Internal Medicine)  Extended Emergency Contact Information Primary Emergency Contact: Woolard,Dontay Address: [address]          [place],  [address] [place] of [place] Phone: [phone] Mobile Phone: [phone] Relation: Other Secondary Emergency Contact: Horne,[name]  [place] Phone: [phone] Relation: Daughter  Code Status: DNRGoals of care: Advanced Directive information Advanced Directives 02/05/2016  Does patient have an advance directive? Yes  Type of Advance Directive Out of facility DNR (pink MOST or yellow form)  Copy of advanced directive(s) in chart? Yes  Pre-existing out of facility DNR order (yellow form or pink MOST form) Yellow form placed in chart (order not valid for inpatient use)     Chief Complaint  Patient presents with  . Acute Visit    blisters on bilateral legs, left upper chest, right foot     HPI:  Pt is a 82 y.o. female seen today for an acute visit for Evaluation of blisters that have appeared in multiple areas. Blisters have been noted in the left outer calf, right outer thigh, left upper chest, and right lateral foot. She is afebrile. Does not appear to be any itching or pain associated with blisters. She has not had previous blistering disorder such as impetigo or pemphigus.   Past Medical History  Diagnosis Date  . Medical history unknown   . Hypertension   . Renal disorder   . Dementia   . Depression   . Kidney disease, chronic, stage II (mild, EGFR 60+ ml/min)   . Hypothyroidism    Past Surgical History  Procedure Laterality Date  . Surgical history unknown    . Cardiac surgery      cath  . Foot surgery      Allergies    Allergen Reactions  . Penicillins Other (See Comments)    Unknown - pt states she's never taken PCN before, per [facility]  Has patient had a PCN reaction causing immediate rash, facial/tongue/throat swelling, SOB or lightheadedness with hypotension: unknown Has patient had a PCN reaction causing severe rash involving mucus membranes or skin necrosis: unknown Has patient had a PCN reaction that required hospitalization unknown Has patient had a PCN reaction occurring within the last 10 years: unknown If all of the above answers are "NO", then may proceed with C      Medication List       This list is accurate as of: 02/05/16  2:15 PM.  Always use your most recent med list.               CERTA-VITE PO  Give 1 tablet by tube daily.     ergocalciferol 50000 units capsule  Commonly known as:  VITAMIN D2  Give 50,000 Units by tube every 30 (thirty) days.     esomeprazole 20 MG capsule  Commonly known as:  NEXIUM  Give 20 mg by tube daily.     fentaNYL 25 MCG/HR patch  Commonly known as:  DURAGESIC - dosed mcg/hr  Apply one patch every 72 hours for pain. Remove old patch. External use only. Rotate sites. Check placement of patch every shift     ferrous sulfate 325 (65 FE) MG tablet  Give 325 mg by tube daily with breakfast.     ipratropium-albuterol 0.5-2.5 (3) MG/3ML Soln  Commonly known as:  DUONEB  Take 3 mLs by nebulization every 4 (four) hours as needed (shortness of breath/ wheezing).     levothyroxine 50 MCG tablet  Commonly known as:  SYNTHROID, LEVOTHROID  Take 50 mcg by mouth daily before breakfast.     lidocaine 5 %  Commonly known as:  LIDODERM  Place 1 patch onto the skin daily. Apply to left knee, Remove & Discard patch within 12 hours or as directed by MD     nebivolol 10 MG tablet  Commonly known as:  BYSTOLIC  10 mg by PEG Tube route daily.     senna-docusate 8.6-50 MG tablet  Commonly known as:  Senokot-S  Take 2 tablets by mouth at bedtime.      Valproic Acid 250 MG/5ML Syrp syrup  Commonly known as:  DEPAKENE  Place 250 mg into feeding tube 2 (two) times daily.        Review of Systems  Constitutional: Negative for fever, chills, diaphoresis, activity change, appetite change, fatigue and unexpected weight change.       Elderly, frail  HENT: Negative for congestion, ear discharge, ear pain, hearing loss, postnasal drip, rhinorrhea, sore throat, tinnitus, trouble swallowing and voice change.   Eyes: Negative for pain, redness, itching and visual disturbance.  Respiratory: Negative for cough, choking, shortness of breath and wheezing.   Cardiovascular: Negative for chest pain, palpitations and leg swelling.  Gastrointestinal: Negative for nausea, abdominal pain, diarrhea, constipation and abdominal distention.       History of dysphagia. Currently on PEG tube feedings.  Endocrine: Negative for cold intolerance, heat intolerance, polydipsia, polyphagia and polyuria.       Hypothyroid  Genitourinary: Negative for dysuria, urgency, frequency, hematuria, flank pain, vaginal discharge, difficulty urinating and pelvic pain.       Incontinent  Musculoskeletal: Negative for myalgias, back pain, arthralgias, gait problem, neck pain and neck stiffness.       History of contractures in both knees  Skin: Negative for color change, pallor and rash.       Multiple areas of blistering of the skin  Allergic/Immunologic: Negative.   Neurological: Positive for speech difficulty and weakness. Negative for dizziness, tremors, seizures, syncope, numbness and headaches.       Severe dementia. Babbles. No appropriate responses.  Hematological: Negative for adenopathy. Does not bruise/bleed easily.       Chronic normocytic anemia  Psychiatric/Behavioral: Positive for dysphoric mood. Negative for suicidal ideas, hallucinations, behavioral problems, confusion, sleep disturbance and agitation. The patient is not nervous/anxious and is not hyperactive.      Immunization History  Administered Date(s) Administered  . Influenza,inj,Quad PF,36+ Mos 07/21/2014  . Influenza-Unspecified 08/26/2015  . Pneumococcal Polysaccharide-23 07/13/2014   Pertinent  Health Maintenance Due  Topic Date Due  . DEXA SCAN  03/19/1986  . PNA vac Low Risk Adult (2 of 2 - PCV13) 07/14/2015  . INFLUENZA VACCINE  06/07/2016     Filed Vitals:   02/05/16 1406  BP: 134/64  Pulse: 67  Temp: 98.5 F (36.9 C)  Resp: 21  Height: '5\' 5"'$  (1.651 m)  Weight: 197 lb (89.359 kg)   Body mass index is 32.78 kg/(m^2). Physical Exam  Constitutional: No distress.  HENT:  Right Ear: External ear normal.  Left Ear: External ear normal.  Nose: Nose normal.  Mouth/Throat: Oropharynx is clear and moist. No oropharyngeal exudate.  Eyes:  Conjunctivae and EOM are normal. Pupils are equal, round, and reactive to light. No scleral icterus.  Neck: No JVD present. No tracheal deviation present. No thyromegaly present.  Cardiovascular: Normal rate, regular rhythm, normal heart sounds and intact distal pulses.  Exam reveals no gallop and no friction rub.   No murmur heard. Pulmonary/Chest: Effort normal. No respiratory distress. She has no wheezes. She has no rales. She exhibits no tenderness.  Abdominal: She exhibits no distension and no mass. There is no tenderness.  Musculoskeletal: Normal range of motion. She exhibits no edema or tenderness.  Severe contractures of both knees.  Lymphadenopathy:    She has no cervical adenopathy.  Neurological: She is alert. No cranial nerve deficit. Coordination normal.  Severely demented. Babbling. No appropriate responses.  Skin: No rash noted. She is not diaphoretic. No erythema. No pallor.  Sacral scars from decubitus ulcer Blisters evident left outer calf, right outer thigh, left upper chest, and some deeper blisters of the right foot which ruptured. Although there is some inflammation, there does not appear to be obvious infection.   Psychiatric: She has a normal mood and affect. Her behavior is normal. Judgment and thought content normal.    Labs reviewed:  Recent Labs  10/21/15  BUN 58*    Recent Labs  11/09/15  AST 13  ALT 3*  ALKPHOS 59    Recent Labs  11/09/15  WBC 5.8  HGB 9.0*  HCT 30*  PLT 255   Lab Results  Component Value Date   TSH 6.420* 07/13/2014   No results found for: HGBA1C No results found for: CHOL, HDL, LDLCALC, LDLDIRECT, TRIG, CHOLHDL  Significant Diagnostic Results in last 30 days:  Dg Abd 1 View  01/13/2016  CLINICAL DATA:  Feeding tube placement EXAM: ABDOMEN - 1 VIEW COMPARISON:  12/30/2015 FINDINGS: Feeding tube in the gastric antrum. Contrast injected through the tube filling the stomach. No extravasation. No bowel obstruction. IMPRESSION: Feeding tube in the gastric antrum. Contrast fills the stomach without extravasation. Electronically Signed   By: Franchot Gallo M.D.   On: 01/13/2016 18:41    Assessment/Plan  1. Blisters of multiple sites etiology is not immediately apparent to me. The 2 highest probabilities are bolus pemphigoid and a bolus impetigo. I favor pemphigoid. -Doxycycline 100 mg twice a day 10 days -Triamcinolone acetonide 0.1% cream daily to blisters until healed. -Discussed with nursing staff. I requested that they call me if she becomes feverish or have blisters continue to spread. I would likely start steroids per PEG at that time.

## 2016-02-05 NOTE — Progress Notes (Signed)
This encounter was created in error - please disregard.

## 2016-03-21 ENCOUNTER — Other Ambulatory Visit: Payer: Self-pay | Admitting: *Deleted

## 2016-03-21 MED ORDER — FENTANYL 25 MCG/HR TD PT72: MEDICATED_PATCH | TRANSDERMAL | Status: DC

## 2016-03-21 NOTE — Telephone Encounter (Signed)
[facility] 

## 2016-04-28 ENCOUNTER — Other Ambulatory Visit: Payer: Self-pay | Admitting: *Deleted

## 2016-04-28 MED ORDER — FENTANYL 25 MCG/HR TD PT72: MEDICATED_PATCH | TRANSDERMAL | Status: AC

## 2016-04-28 NOTE — Telephone Encounter (Signed)
[facility] 

## 2016-04-28 NOTE — Telephone Encounter (Signed)
DISREGARD RX. Shredded due to we no longer [facility].

## 2016-05-07 DEATH — deceased
